# Patient Record
Sex: Male | Born: 1935 | Race: White | Hispanic: No | Marital: Married | Smoking: Never smoker
Health system: Southern US, Community
[De-identification: ages and names within clinical notes are randomized; demographics above are authoritative.]

## PROBLEM LIST (undated history)

## (undated) DIAGNOSIS — I10 Essential (primary) hypertension: Secondary | ICD-10-CM

## (undated) DIAGNOSIS — M1990 Unspecified osteoarthritis, unspecified site: Secondary | ICD-10-CM

## (undated) DIAGNOSIS — D649 Anemia, unspecified: Secondary | ICD-10-CM

## (undated) DIAGNOSIS — C801 Malignant (primary) neoplasm, unspecified: Secondary | ICD-10-CM

## (undated) DIAGNOSIS — M549 Dorsalgia, unspecified: Secondary | ICD-10-CM

## (undated) HISTORY — DX: Unspecified osteoarthritis, unspecified site: M19.90

## (undated) HISTORY — PX: SPINE SURGERY: SHX786

## (undated) HISTORY — DX: Malignant (primary) neoplasm, unspecified: C80.1

## (undated) HISTORY — PX: EYE SURGERY: SHX253

## (undated) HISTORY — PX: COLON SURGERY: SHX602

---

## 2002-04-08 ENCOUNTER — Ambulatory Visit (HOSPITAL_COMMUNITY): Admission: RE | Admit: 2002-04-08 | Discharge: 2002-04-08 | Payer: Self-pay | Admitting: Gastroenterology

## 2003-06-10 ENCOUNTER — Encounter: Admission: RE | Admit: 2003-06-10 | Discharge: 2003-07-22 | Payer: Self-pay | Admitting: *Deleted

## 2006-01-03 ENCOUNTER — Encounter: Admission: RE | Admit: 2006-01-03 | Discharge: 2006-01-03 | Payer: Self-pay | Admitting: Family Medicine

## 2006-01-10 ENCOUNTER — Encounter: Admission: RE | Admit: 2006-01-10 | Discharge: 2006-01-10 | Payer: Self-pay | Admitting: Family Medicine

## 2011-01-09 ENCOUNTER — Other Ambulatory Visit: Payer: Self-pay | Admitting: Family Medicine

## 2011-01-09 DIAGNOSIS — M542 Cervicalgia: Secondary | ICD-10-CM

## 2011-01-11 ENCOUNTER — Ambulatory Visit
Admission: RE | Admit: 2011-01-11 | Discharge: 2011-01-11 | Disposition: A | Discharge status: Home / Self Care | Payer: MEDICARE | Source: Ambulatory Visit | Attending: Family Medicine | Admitting: Family Medicine

## 2011-01-11 ENCOUNTER — Other Ambulatory Visit: Payer: Self-pay | Admitting: Family Medicine

## 2011-01-11 ENCOUNTER — Other Ambulatory Visit: Payer: Self-pay

## 2011-01-11 DIAGNOSIS — M542 Cervicalgia: Secondary | ICD-10-CM

## 2011-01-29 ENCOUNTER — Other Ambulatory Visit: Payer: Self-pay | Admitting: Family Medicine

## 2011-01-29 DIAGNOSIS — M542 Cervicalgia: Secondary | ICD-10-CM

## 2011-01-31 ENCOUNTER — Ambulatory Visit
Admission: RE | Admit: 2011-01-31 | Discharge: 2011-01-31 | Disposition: A | Discharge status: Home / Self Care | Payer: MEDICARE | Source: Ambulatory Visit | Attending: Family Medicine | Admitting: Family Medicine

## 2011-01-31 DIAGNOSIS — M542 Cervicalgia: Secondary | ICD-10-CM

## 2012-06-05 ENCOUNTER — Other Ambulatory Visit: Payer: Self-pay | Admitting: Family Medicine

## 2012-06-05 DIAGNOSIS — M545 Low back pain, unspecified: Secondary | ICD-10-CM

## 2012-06-05 DIAGNOSIS — M79606 Pain in leg, unspecified: Secondary | ICD-10-CM

## 2012-06-08 ENCOUNTER — Ambulatory Visit
Admission: RE | Admit: 2012-06-08 | Discharge: 2012-06-08 | Disposition: A | Discharge status: Home / Self Care | Payer: 59 | Source: Ambulatory Visit | Attending: Family Medicine | Admitting: Family Medicine

## 2012-06-08 DIAGNOSIS — M545 Low back pain, unspecified: Secondary | ICD-10-CM

## 2012-06-08 DIAGNOSIS — M79606 Pain in leg, unspecified: Secondary | ICD-10-CM

## 2014-07-21 ENCOUNTER — Encounter (HOSPITAL_COMMUNITY): Payer: Self-pay | Admitting: Emergency Medicine

## 2014-07-21 ENCOUNTER — Emergency Department (HOSPITAL_COMMUNITY): Payer: Medicare Other

## 2014-07-21 ENCOUNTER — Inpatient Hospital Stay (HOSPITAL_COMMUNITY)
Admission: EM | Admit: 2014-07-21 | Discharge: 2014-07-28 | DRG: 390 | Disposition: A | Discharge status: Home / Self Care | Payer: Medicare Other | Attending: Surgery | Admitting: Surgery

## 2014-07-21 ENCOUNTER — Ambulatory Visit (INDEPENDENT_AMBULATORY_CARE_PROVIDER_SITE_OTHER): Payer: Medicare Other | Admitting: Emergency Medicine

## 2014-07-21 ENCOUNTER — Ambulatory Visit (INDEPENDENT_AMBULATORY_CARE_PROVIDER_SITE_OTHER): Payer: Medicare Other

## 2014-07-21 VITALS — BP 162/70 | HR 63 | Temp 98.9°F | Resp 18 | Ht 68.5 in | Wt 154.0 lb

## 2014-07-21 DIAGNOSIS — Z85038 Personal history of other malignant neoplasm of large intestine: Secondary | ICD-10-CM

## 2014-07-21 DIAGNOSIS — D72829 Elevated white blood cell count, unspecified: Secondary | ICD-10-CM

## 2014-07-21 DIAGNOSIS — M549 Dorsalgia, unspecified: Secondary | ICD-10-CM | POA: Diagnosis present

## 2014-07-21 DIAGNOSIS — K56609 Unspecified intestinal obstruction, unspecified as to partial versus complete obstruction: Principal | ICD-10-CM | POA: Diagnosis present

## 2014-07-21 DIAGNOSIS — R109 Unspecified abdominal pain: Secondary | ICD-10-CM

## 2014-07-21 DIAGNOSIS — Z9049 Acquired absence of other specified parts of digestive tract: Secondary | ICD-10-CM

## 2014-07-21 DIAGNOSIS — I10 Essential (primary) hypertension: Secondary | ICD-10-CM | POA: Diagnosis present

## 2014-07-21 DIAGNOSIS — K56600 Partial intestinal obstruction, unspecified as to cause: Secondary | ICD-10-CM | POA: Diagnosis present

## 2014-07-21 DIAGNOSIS — M129 Arthropathy, unspecified: Secondary | ICD-10-CM | POA: Diagnosis present

## 2014-07-21 HISTORY — DX: Dorsalgia, unspecified: M54.9

## 2014-07-21 LAB — POCT CBC
GRANULOCYTE PERCENT: 84 % — AB (ref 37–80)
HCT, POC: 41.8 % — AB (ref 43.5–53.7)
HEMOGLOBIN: 13.2 g/dL — AB (ref 14.1–18.1)
LYMPH, POC: 1.1 (ref 0.6–3.4)
MCH, POC: 30.8 pg (ref 27–31.2)
MCHC: 31.6 g/dL — AB (ref 31.8–35.4)
MCV: 97.5 fL — AB (ref 80–97)
MID (CBC): 0.6 (ref 0–0.9)
MPV: 7.4 fL (ref 0–99.8)
POC GRANULOCYTE: 9.2 — AB (ref 2–6.9)
POC LYMPH %: 10.2 % (ref 10–50)
POC MID %: 5.8 % (ref 0–12)
Platelet Count, POC: 242 10*3/uL (ref 142–424)
RBC: 4.29 M/uL — AB (ref 4.69–6.13)
RDW, POC: 14.6 %
WBC: 10.9 10*3/uL — AB (ref 4.6–10.2)

## 2014-07-21 LAB — POCT UA - MICROSCOPIC ONLY
CRYSTALS, UR, HPF, POC: NEGATIVE
Casts, Ur, LPF, POC: NEGATIVE
Mucus, UA: NEGATIVE
YEAST UA: NEGATIVE

## 2014-07-21 LAB — COMPREHENSIVE METABOLIC PANEL
ALK PHOS: 73 U/L (ref 39–117)
ALT: 15 U/L (ref 0–53)
AST: 20 U/L (ref 0–37)
Albumin: 3.7 g/dL (ref 3.5–5.2)
Anion gap: 13 (ref 5–15)
BILIRUBIN TOTAL: 0.5 mg/dL (ref 0.3–1.2)
BUN: 20 mg/dL (ref 6–23)
CALCIUM: 8.9 mg/dL (ref 8.4–10.5)
CO2: 23 mEq/L (ref 19–32)
CREATININE: 1.01 mg/dL (ref 0.50–1.35)
Chloride: 103 mEq/L (ref 96–112)
GFR calc Af Amer: 82 mL/min — ABNORMAL LOW (ref >90)
GFR, EST NON AFRICAN AMERICAN: 71 mL/min — AB (ref >90)
GLUCOSE: 136 mg/dL — AB (ref 70–99)
Potassium: 4.4 mEq/L (ref 3.7–5.3)
SODIUM: 139 meq/L (ref 137–147)
TOTAL PROTEIN: 6.8 g/dL (ref 6.0–8.3)

## 2014-07-21 LAB — POCT URINALYSIS DIPSTICK
BILIRUBIN UA: NEGATIVE
GLUCOSE UA: NEGATIVE
KETONES UA: 40
LEUKOCYTES UA: NEGATIVE
NITRITE UA: NEGATIVE
Protein, UA: 30
Spec Grav, UA: >=1.03
Urobilinogen, UA: 0.2
pH, UA: 6

## 2014-07-21 LAB — URINALYSIS, ROUTINE W REFLEX MICROSCOPIC
BILIRUBIN URINE: NEGATIVE
GLUCOSE, UA: NEGATIVE mg/dL
KETONES UR: 40 mg/dL — AB
LEUKOCYTES UA: NEGATIVE
NITRITE: NEGATIVE
Protein, ur: 30 mg/dL — AB
SPECIFIC GRAVITY, URINE: 1.025 (ref 1.005–1.030)
Urobilinogen, UA: 0.2 mg/dL (ref 0.0–1.0)
pH: 6 (ref 5.0–8.0)

## 2014-07-21 LAB — CBC
HEMATOCRIT: 38.6 % — AB (ref 39.0–52.0)
HEMOGLOBIN: 12.6 g/dL — AB (ref 13.0–17.0)
MCH: 30.6 pg (ref 26.0–34.0)
MCHC: 32.6 g/dL (ref 30.0–36.0)
MCV: 93.7 fL (ref 78.0–100.0)
Platelets: 232 10*3/uL (ref 150–400)
RBC: 4.12 MIL/uL — ABNORMAL LOW (ref 4.22–5.81)
RDW: 12.6 % (ref 11.5–15.5)
WBC: 12.2 10*3/uL — AB (ref 4.0–10.5)

## 2014-07-21 LAB — URINE MICROSCOPIC-ADD ON

## 2014-07-21 LAB — LIPASE, BLOOD: Lipase: 31 U/L (ref 11–59)

## 2014-07-21 MED ORDER — SODIUM CHLORIDE 0.9 % IV BOLUS (SEPSIS)
1000.0000 mL | Freq: Once | INTRAVENOUS | Status: AC
  Administered 2014-07-21: 1000 mL via INTRAVENOUS

## 2014-07-21 MED ORDER — MORPHINE SULFATE 4 MG/ML IJ SOLN
4.0000 mg | Freq: Once | INTRAMUSCULAR | Status: AC
  Administered 2014-07-21: 4 mg via INTRAVENOUS
  Filled 2014-07-21: qty 1

## 2014-07-21 MED ORDER — IOHEXOL 300 MG/ML  SOLN
100.0000 mL | Freq: Once | INTRAMUSCULAR | Status: AC | PRN
  Administered 2014-07-21: 100 mL via INTRAVENOUS

## 2014-07-21 MED ORDER — ONDANSETRON HCL 4 MG/2ML IJ SOLN
4.0000 mg | Freq: Once | INTRAMUSCULAR | Status: AC
  Administered 2014-07-21: 4 mg via INTRAVENOUS
  Filled 2014-07-21: qty 2

## 2014-07-21 MED ORDER — IOHEXOL 300 MG/ML  SOLN
50.0000 mL | Freq: Once | INTRAMUSCULAR | Status: AC | PRN
  Administered 2014-07-21: 50 mL via ORAL

## 2014-07-21 MED ORDER — LIDOCAINE HCL 2 % EX GEL
CUTANEOUS | Status: AC
Start: 2014-07-21 — End: 2014-07-22
  Administered 2014-07-22
  Filled 2014-07-21: qty 10

## 2014-07-21 NOTE — Progress Notes (Signed)
[facility] [address], [place] [address] [phone]  Date:  07/21/2014   Name:  Cory Bright   DOB:  1936/12/01   MRN:  [id number]  PCP:   Melinda Crutch, MD    Chief Complaint: Abdominal Pain   History of Present Illness:  Cory Bright is a 78 y.o. very pleasant male patient who presents with the following:  Well yesterday and awakened with abdominal pain and nausea and vomiting.  No stool change.  Last BM this morning.  No blood mucous or pus in stool.  History of colon resection for cancer. No fever or chills.  No GU symptoms.  Poor appetite and po intake today. No improvement with over the counter medications or other home remedies. Denies other complaint or health concern today.   There are no active problems to display for this patient.   Past Medical History  Diagnosis Date  . Arthritis   . Cancer     Past Surgical History  Procedure Laterality Date  . Colon surgery    . Spine surgery      History  Substance Use Topics  . Smoking status: Never Smoker   . Smokeless tobacco: Not on file  . Alcohol Use: No    History reviewed. No pertinent family history.  No Known Allergies  Medication list has been reviewed and updated.  No current outpatient prescriptions on file prior to visit.   No current facility-administered medications on file prior to visit.    Review of Systems:  As per HPI, otherwise negative.    Physical Examination: Filed Vitals:   07/21/14 1750  BP: 162/70  Pulse: 63  Temp: 98.9 F (37.2 C)  Resp: 18   Filed Vitals:   07/21/14 1750  Height: 5' 8.5" (1.74 m)  Weight: 154 lb (69.854 kg)   Body mass index is 23.07 kg/(m^2). Ideal Body Weight: Weight in (lb) to have BMI = 25: 166.5  GEN: WDWN, NAD, Non-toxic, A & O x 3 HEENT: Atraumatic, Normocephalic. Neck supple. No masses, No LAD. Ears and Nose: No external deformity. CV: RRR, No M/G/R. No JVD. No thrill. No extra heart sounds. PULM: CTA  B, no wheezes, crackles, rhonchi. No retractions. No resp. distress. No accessory muscle use. ABD: S, ND, +BS. No rebound. No HSM.  Generalized tenderness worse on left.   EXTR: No c/c/e NEURO Normal gait.  PSYCH: Normally interactive. Conversant. Not depressed or anxious appearing.  Calm demeanor.    Assessment and Plan: Abdominal pain with persistent vomiting Dehydration To ER via EMS  CT abdomen  Signed,  Ellison Carwin, MD   Results for orders placed in visit on 07/21/14  POCT CBC      Result Value Ref Range   WBC 10.9 (*) 4.6 - 10.2 K/uL   Lymph, poc 1.1  0.6 - 3.4   POC LYMPH PERCENT 10.2  10 - 50 %L   MID (cbc) 0.6  0 - 0.9   POC MID % 5.8  0 - 12 %M   POC Granulocyte 9.2 (*) 2 - 6.9   Granulocyte percent 84.0 (*) 37 - 80 %G   RBC 4.29 (*) 4.69 - 6.13 M/uL   Hemoglobin 13.2 (*) 14.1 - 18.1 g/dL   HCT, POC 41.8 (*) 43.5 - 53.7 %   MCV 97.5 (*) 80 - 97 fL   MCH, POC 30.8  27 - 31.2 pg   MCHC 31.6 (*) 31.8 - 35.4 g/dL  RDW, POC 14.6     Platelet Count, POC 242  142 - 424 K/uL   MPV 7.4  0 - 99.8 fL  POCT UA - MICROSCOPIC ONLY      Result Value Ref Range   WBC, Ur, HPF, POC 0-2     RBC, urine, microscopic 2-4     Bacteria, U Microscopic trace     Mucus, UA neg     Epithelial cells, urine per micros 0-1     Crystals, Ur, HPF, POC neg     Casts, Ur, LPF, POC neg     Yeast, UA neg    POCT URINALYSIS DIPSTICK      Result Value Ref Range   Color, UA yellow     Clarity, UA clear     Glucose, UA neg     Bilirubin, UA neg     Ketones, UA 40     Spec Grav, UA >=1.030     Blood, UA trace     pH, UA 6.0     Protein, UA 30     Urobilinogen, UA 0.2     Nitrite, UA neg     Leukocytes, UA Negative     [facility] reading (PRIMARY) by  Dr. Ouida Sills.  No obstruction or free air.  Right colectomy .

## 2014-07-21 NOTE — ED Notes (Signed)
Per EMS pt coming from [place], complaining of abdominal pain since this morning, vomited this morning, vomited x 3 at UC, 18G RFA, adominal pain is LQ, UC MD suspects diverticulitis, UC MD suggest CT scan, 7/10 pain. Denies diarrhea.

## 2014-07-21 NOTE — ED Notes (Signed)
Bed: WA07 Expected date:  Expected time:  Means of arrival:  Comments: EMS 78yo M abd pain from UC, 18g IV placed per UC

## 2014-07-21 NOTE — ED Provider Notes (Signed)
CSN: [id number]     Arrival date & time 07/21/14  2021 History   First MD Initiated Contact with Patient 07/21/14 2049     Chief Complaint  Patient presents with  . Abdominal Pain     (Consider location/radiation/quality/duration/timing/severity/associated sxs/prior Treatment) HPI Pt presents with c/o left sided abdominal pain which began this morning.  He felt well at breakfast, but afterwards developed nausea and vomiting with persistent left sided abdominal pain. No fever/chills.  No change in stools.  He had 3 episodes of emesis- nonbloody and nonbilious.  He has hx of colon resection in 1993 performed for colon cancer- no other treatments required- no hx of other surgeries.  Pt has not had similar symptoms previously.  There are no other associated systemic symptoms, there are no other alleviating or modifying factors.   Past Medical History  Diagnosis Date  . Arthritis   . Back pain   . Cancer     colon   Past Surgical History  Procedure Laterality Date  . Spine surgery    . Colon surgery      colectomy, R   No family history on file. History  Substance Use Topics  . Smoking status: Never Smoker   . Smokeless tobacco: Not on file  . Alcohol Use: No    Review of Systems ROS reviewed and all otherwise negative except for mentioned in HPI    Allergies  Review of patient's allergies indicates no known allergies.  Home Medications   Prior to Admission medications   Medication Sig Start Date End Date Taking? Authorizing Provider  aspirin 81 MG tablet Take 81 mg by mouth daily.   Yes Historical Provider, MD  HYDROcodone-acetaminophen (NORCO) 10-325 MG per tablet Take 1 tablet by mouth every 6 (six) hours as needed for moderate pain.    Yes Historical Provider, MD  lisinopril (PRINIVIL,ZESTRIL) 20 MG tablet Take 20 mg by mouth daily.   Yes Historical Provider, MD  meloxicam (MOBIC) 7.5 MG tablet Take 7.5 mg by mouth daily.   Yes Historical Provider, MD  pravastatin  (PRAVACHOL) 40 MG tablet Take 40 mg by mouth daily.   Yes Historical Provider, MD  tamsulosin (FLOMAX) 0.4 MG CAPS capsule Take 0.4 mg by mouth.   Yes Historical Provider, MD   BP 187/60  Pulse 78  Temp(Src) 99.2 F (37.3 C) (Oral)  Resp 16  SpO2 100% Vitals reviewed Physical Exam Physical Examination: General appearance - alert, well appearing, and in no distress Mental status - alert, oriented to person, place, and time Eyes -no conjunctival injection, no scleral icterus Mouth - MM tacky, OP clear Chest - clear to auscultation, no wheezes, rales or rhonchi, symmetric air entry Heart - normal rate, regular rhythm, normal S1, S2, no murmurs, rubs, clicks or gallops Abdomen - soft, mild ttp in left lower abdomen, no gaurding and no rebound tenderness, nondistended, no masses or organomegaly, nabs Extremities - peripheral pulses normal, no pedal edema, no clubbing or cyanosis Skin - normal coloration and turgor, no rashes  ED Course  Procedures (including critical care time)  11:33 PM d/w surgery for consultation.  They will see patient in the ED.   Labs Review Labs Reviewed  CBC - Abnormal; Notable for the following:    WBC 12.2 (*)    RBC 4.12 (*)    Hemoglobin 12.6 (*)    HCT 38.6 (*)    All other components within normal limits  COMPREHENSIVE METABOLIC PANEL - Abnormal; Notable for the following:  Glucose, Bld 136 (*)    GFR calc non Af Amer 71 (*)    GFR calc Af Amer 82 (*)    All other components within normal limits  URINALYSIS, ROUTINE W REFLEX MICROSCOPIC - Abnormal; Notable for the following:    Hgb urine dipstick TRACE (*)    Ketones, ur 40 (*)    Protein, ur 30 (*)    All other components within normal limits  LIPASE, BLOOD  URINE MICROSCOPIC-ADD ON    Imaging Review Ct Abdomen Pelvis W Contrast  07/21/2014   CLINICAL DATA:  Mid abdominal pain, nausea and vomiting.  EXAM: CT ABDOMEN AND PELVIS WITH CONTRAST  TECHNIQUE: Multidetector CT imaging of the  abdomen and pelvis was performed using the standard protocol following bolus administration of intravenous contrast.  CONTRAST:  71mL OMNIPAQUE IOHEXOL 300 MG/ML SOLN, 13mL OMNIPAQUE IOHEXOL 300 MG/ML SOLN  COMPARISON:  CT of the abdomen performed 08/12/2012  FINDINGS: The visualized lung bases are clear. Mild coronary artery calcification is noted.  The liver and spleen are unremarkable in appearance. The gallbladder is within normal limits. The pancreas and adrenal glands are unremarkable.  The cyst at the anterior aspect of the right kidney has decreased in size, now measuring 3.3 x 1.3 cm, but is now somewhat hyperattenuating. This may reflect minimal hemorrhage into the cyst. Given the interval decrease in size, malignancy is thought to be very unlikely. Attenuation is relatively stable on venous and delayed images.  Scattered small bilateral renal cysts are seen. Nonspecific perinephric stranding is noted bilaterally. There is no evidence of hydronephrosis. No renal or ureteral stones are seen. No perinephric stranding is appreciated.  There is dilatation of small bowel loops to 3.4 cm in maximal diameter, with mild surrounding soft tissue inflammation and trace free fluid, extending from the level of the jejunum to the proximal to mid ileum. There is fecalization of a long segment of ileum, with an associated transition point at the left mid abdomen, raising concern for small bowel obstruction. This most likely reflects an underlying adhesion.  There is decompression of more distal small bowel loops. The patient is status post partial resection of the cecum and ascending colon. The anastomosis is not well characterized, but appears grossly unremarkable. Minimal diverticulosis is noted along the proximal sigmoid colon. The remaining colon is otherwise within normal limits.  The stomach is within normal limits. No acute vascular abnormalities are seen. Minimal calcification is seen along the abdominal aorta.   The bladder is mildly distended and grossly unremarkable in appearance. The prostate remains borderline normal in size. Scattered calcification is seen within the prostate. No inguinal lymphadenopathy is seen.  No acute osseous abnormalities are identified. Multilevel vacuum phenomenon is noted along the lumbar spine. There is mild grade 1 anterolisthesis of L4 on L5, reflecting underlying facet disease.  IMPRESSION: 1. Dilatation of small-bowel loops to 3.4 cm in maximal diameter, with mild surrounding soft tissue inflammation and trace free fluid, extending to the proximal to mid ileum. Fecalization of a long segment of ileum, with a transition point at the left mid abdomen, raising concern for small bowel obstruction. This most likely reflects an underlying adhesion. 2. Scattered small bilateral renal cysts seen. The largest cyst has decreased in size. Its increased attenuation may reflect interval hemorrhage into the cyst. 3. Minimal diverticulosis noted along the proximal sigmoid colon. 4. Minimal degenerative change noted along the lumbar spine. 5. Mild coronary artery calcification noted.  These results were called by telephone at  the time of interpretation on 07/21/2014 at 11:09 pm to Dr. Alfonzo Beers , who verbally acknowledged these results.   Electronically Signed   By: Garald Balding M.D.   On: 07/21/2014 23:09   Dg Abd Acute W/chest  07/21/2014   CLINICAL DATA:  Left-sided abdominal pain.  EXAM: ACUTE ABDOMEN SERIES (ABDOMEN 2 VIEW & CHEST 1 VIEW)  COMPARISON:  Chest x-ray 07/10/2011.  FINDINGS: Lung volumes are normal. No consolidative airspace disease. No pleural effusions. No pneumothorax. No pulmonary nodule or mass noted. Pulmonary vasculature and the cardiomediastinal silhouette are within normal limits.  Postoperative changes of prior right hemicolectomy are noted. Gas and stool are seen scattered throughout the colon extending to the level of the distal rectum. No pathologic distension of  small bowel is noted. No gross evidence of pneumoperitoneum. A few nondilated gas-filled loops of small bowel are noted.  IMPRESSION: 1. Nonspecific nonobstructive bowel gas pattern. 2. No pneumoperitoneum. 3. Status post right hemicolectomy. 4. No radiographic evidence of acute cardiopulmonary disease.   Electronically Signed   By: Vinnie Langton M.D.   On: 07/21/2014 19:34     EKG Interpretation None      MDM   Final diagnoses:  Small bowel obstruction  Leukocytosis    Pt with abdominal pain, nausea and vomiting.  CT scan shows SBO.  Pt feels improved after treatment with IV fluids, morphine, zofran.  Surgery consulted and will see patient in the ED.  Ordered NG tube, pt remains NPO.  Pt udpated about findings and plan for admission.    Nursing notes including past medical history and social history reviewed and considered in documentation Prior records reviewed and considered during this visit     Threasa Beards, MD 07/22/14 0006

## 2014-07-22 DIAGNOSIS — M549 Dorsalgia, unspecified: Secondary | ICD-10-CM | POA: Diagnosis present

## 2014-07-22 DIAGNOSIS — I10 Essential (primary) hypertension: Secondary | ICD-10-CM | POA: Diagnosis present

## 2014-07-22 DIAGNOSIS — Z85038 Personal history of other malignant neoplasm of large intestine: Secondary | ICD-10-CM | POA: Diagnosis not present

## 2014-07-22 DIAGNOSIS — Z9049 Acquired absence of other specified parts of digestive tract: Secondary | ICD-10-CM | POA: Diagnosis not present

## 2014-07-22 DIAGNOSIS — K56609 Unspecified intestinal obstruction, unspecified as to partial versus complete obstruction: Secondary | ICD-10-CM | POA: Diagnosis present

## 2014-07-22 DIAGNOSIS — M129 Arthropathy, unspecified: Secondary | ICD-10-CM | POA: Diagnosis present

## 2014-07-22 DIAGNOSIS — K56600 Partial intestinal obstruction, unspecified as to cause: Secondary | ICD-10-CM | POA: Diagnosis present

## 2014-07-22 LAB — CBC
HCT: 35.1 % — ABNORMAL LOW (ref 39.0–52.0)
HEMOGLOBIN: 11.8 g/dL — AB (ref 13.0–17.0)
MCH: 31.1 pg (ref 26.0–34.0)
MCHC: 33.6 g/dL (ref 30.0–36.0)
MCV: 92.6 fL (ref 78.0–100.0)
Platelets: 237 10*3/uL (ref 150–400)
RBC: 3.79 MIL/uL — AB (ref 4.22–5.81)
RDW: 12.8 % (ref 11.5–15.5)
WBC: 11.3 10*3/uL — ABNORMAL HIGH (ref 4.0–10.5)

## 2014-07-22 LAB — BASIC METABOLIC PANEL
Anion gap: 12 (ref 5–15)
BUN: 18 mg/dL (ref 6–23)
CHLORIDE: 104 meq/L (ref 96–112)
CO2: 24 meq/L (ref 19–32)
Calcium: 8.5 mg/dL (ref 8.4–10.5)
Creatinine, Ser: 0.99 mg/dL (ref 0.50–1.35)
GFR calc Af Amer: >90 mL/min (ref >90)
GFR calc non Af Amer: 78 mL/min — ABNORMAL LOW (ref >90)
GLUCOSE: 113 mg/dL — AB (ref 70–99)
POTASSIUM: 4.1 meq/L (ref 3.7–5.3)
Sodium: 140 mEq/L (ref 137–147)

## 2014-07-22 MED ORDER — ENOXAPARIN SODIUM 40 MG/0.4ML ~~LOC~~ SOLN
40.0000 mg | SUBCUTANEOUS | Status: DC
  Administered 2014-07-22 – 2014-07-28 (×7): 40 mg via SUBCUTANEOUS
  Filled 2014-07-22 (×7): qty 0.4

## 2014-07-22 MED ORDER — DOCUSATE SODIUM 100 MG PO CAPS
100.0000 mg | ORAL_CAPSULE | Freq: Two times a day (BID) | ORAL | Status: DC
  Administered 2014-07-22: 100 mg via ORAL
  Filled 2014-07-22 (×4): qty 1

## 2014-07-22 MED ORDER — CHLORHEXIDINE GLUCONATE 0.12 % MT SOLN
15.0000 mL | Freq: Two times a day (BID) | OROMUCOSAL | Status: DC
  Administered 2014-07-22 – 2014-07-26 (×7): 15 mL via OROMUCOSAL
  Filled 2014-07-22 (×13): qty 15

## 2014-07-22 MED ORDER — MORPHINE SULFATE 2 MG/ML IJ SOLN
2.0000 mg | INTRAMUSCULAR | Status: DC | PRN
  Administered 2014-07-22 (×2): 2 mg via INTRAVENOUS
  Administered 2014-07-22: 4 mg via INTRAVENOUS
  Administered 2014-07-22: 2 mg via INTRAVENOUS
  Administered 2014-07-22: 4 mg via INTRAVENOUS
  Administered 2014-07-23 (×2): 2 mg via INTRAVENOUS
  Filled 2014-07-22: qty 2
  Filled 2014-07-22 (×4): qty 1
  Filled 2014-07-22: qty 2
  Filled 2014-07-22: qty 1

## 2014-07-22 MED ORDER — TAMSULOSIN HCL 0.4 MG PO CAPS
0.4000 mg | ORAL_CAPSULE | Freq: Every day | ORAL | Status: DC
  Administered 2014-07-22: 0.4 mg via ORAL
  Filled 2014-07-22 (×2): qty 1

## 2014-07-22 MED ORDER — LISINOPRIL 20 MG PO TABS
20.0000 mg | ORAL_TABLET | Freq: Every day | ORAL | Status: DC
  Administered 2014-07-22: 20 mg via ORAL
  Filled 2014-07-22 (×2): qty 1

## 2014-07-22 MED ORDER — CETYLPYRIDINIUM CHLORIDE 0.05 % MT LIQD
7.0000 mL | Freq: Two times a day (BID) | OROMUCOSAL | Status: DC
  Administered 2014-07-23 – 2014-07-25 (×6): 7 mL via OROMUCOSAL

## 2014-07-22 MED ORDER — ONDANSETRON HCL 4 MG/2ML IJ SOLN
4.0000 mg | Freq: Four times a day (QID) | INTRAMUSCULAR | Status: DC | PRN
  Administered 2014-07-22 – 2014-07-23 (×3): 4 mg via INTRAVENOUS
  Filled 2014-07-22 (×3): qty 2

## 2014-07-22 MED ORDER — SODIUM CHLORIDE 0.9 % IV SOLN
INTRAVENOUS | Status: DC
  Administered 2014-07-22: 01:00:00 via INTRAVENOUS
  Administered 2014-07-22 – 2014-07-23 (×2): 100 mL via INTRAVENOUS
  Administered 2014-07-23: 16:00:00 via INTRAVENOUS
  Administered 2014-07-24: 100 mL via INTRAVENOUS
  Administered 2014-07-24 – 2014-07-26 (×6): via INTRAVENOUS
  Administered 2014-07-26: 100 mL/h via INTRAVENOUS
  Administered 2014-07-27 (×2): via INTRAVENOUS

## 2014-07-22 NOTE — H&P (Signed)
Cory Bright is an 78 y.o. male.   Chief Complaint: nausea, vomiting HPI: This is a 78 year old male who presents to the hospital with one day of nausea and vomiting and abdominal pain. He was in his usual state of health until this morning when he began to have severe abdominal pain associated with nausea and vomiting. He did have a regular bowel movement this morning. He is status post a right hemicolectomy at [facility] in the 1990s for colon cancer.  Past Medical History  Diagnosis Date  . Arthritis   . Back pain   . Cancer     colon    Past Surgical History  Procedure Laterality Date  . Spine surgery    . Colon surgery      colectomy, R    No family history on file. Social History:  reports that he has never smoked. He does not have any smokeless tobacco history on file. He reports that he does not drink alcohol or use illicit drugs.  Allergies: No Known Allergies   (Not in a hospital admission)  Results for orders placed during the hospital encounter of 07/21/14 (from the past 48 hour(s))  URINALYSIS, ROUTINE W REFLEX MICROSCOPIC     Status: Abnormal   Collection Time    07/21/14  8:51 PM      Result Value Ref Range   Color, Urine YELLOW  YELLOW   APPearance CLEAR  CLEAR   Specific Gravity, Urine 1.025  1.005 - 1.030   pH 6.0  5.0 - 8.0   Glucose, UA NEGATIVE  NEGATIVE mg/dL   Hgb urine dipstick TRACE (*) NEGATIVE   Bilirubin Urine NEGATIVE  NEGATIVE   Ketones, ur 40 (*) NEGATIVE mg/dL   Protein, ur 30 (*) NEGATIVE mg/dL   Urobilinogen, UA 0.2  0.0 - 1.0 mg/dL   Nitrite NEGATIVE  NEGATIVE   Leukocytes, UA NEGATIVE  NEGATIVE  URINE MICROSCOPIC-ADD ON     Status: None   Collection Time    07/21/14  8:51 PM      Result Value Ref Range   WBC, UA 0-2  <3 WBC/hpf   RBC / HPF 7-10  <3 RBC/hpf   Bacteria, UA RARE  RARE  CBC     Status: Abnormal   Collection Time    07/21/14  9:34 PM      Result Value Ref Range   WBC 12.2 (*) 4.0 - 10.5 K/uL   RBC 4.12 (*) 4.22 -  5.81 MIL/uL   Hemoglobin 12.6 (*) 13.0 - 17.0 g/dL   HCT 38.6 (*) 39.0 - 52.0 %   MCV 93.7  78.0 - 100.0 fL   MCH 30.6  26.0 - 34.0 pg   MCHC 32.6  30.0 - 36.0 g/dL   RDW 12.6  11.5 - 15.5 %   Platelets 232  150 - 400 K/uL  COMPREHENSIVE METABOLIC PANEL     Status: Abnormal   Collection Time    07/21/14  9:34 PM      Result Value Ref Range   Sodium 139  137 - 147 mEq/L   Potassium 4.4  3.7 - 5.3 mEq/L   Chloride 103  96 - 112 mEq/L   CO2 23  19 - 32 mEq/L   Glucose, Bld 136 (*) 70 - 99 mg/dL   BUN 20  6 - 23 mg/dL   Creatinine, Ser 1.01  0.50 - 1.35 mg/dL   Calcium 8.9  8.4 - 10.5 mg/dL   Total Protein 6.8  6.0 - 8.3 g/dL   Albumin 3.7  3.5 - 5.2 g/dL   AST 20  0 - 37 U/L   ALT 15  0 - 53 U/L   Alkaline Phosphatase 73  39 - 117 U/L   Total Bilirubin 0.5  0.3 - 1.2 mg/dL   GFR calc non Af Amer 71 (*) >90 mL/min   GFR calc Af Amer 82 (*) >90 mL/min   Comment: (NOTE)     The eGFR has been calculated using the CKD EPI equation.     This calculation has not been validated in all clinical situations.     eGFR's persistently <90 mL/min signify possible Chronic Kidney     Disease.   Anion gap 13  5 - 15  LIPASE, BLOOD     Status: None   Collection Time    07/21/14  9:34 PM      Result Value Ref Range   Lipase 31  11 - 59 U/L   Ct Abdomen Pelvis W Contrast  07/21/2014   CLINICAL DATA:  Mid abdominal pain, nausea and vomiting.  EXAM: CT ABDOMEN AND PELVIS WITH CONTRAST  TECHNIQUE: Multidetector CT imaging of the abdomen and pelvis was performed using the standard protocol following bolus administration of intravenous contrast.  CONTRAST:  16m OMNIPAQUE IOHEXOL 300 MG/ML SOLN, 1060mOMNIPAQUE IOHEXOL 300 MG/ML SOLN  COMPARISON:  CT of the abdomen performed 08/12/2012  FINDINGS: The visualized lung bases are clear. Mild coronary artery calcification is noted.  The liver and spleen are unremarkable in appearance. The gallbladder is within normal limits. The pancreas and adrenal glands  are unremarkable.  The cyst at the anterior aspect of the right kidney has decreased in size, now measuring 3.3 x 1.3 cm, but is now somewhat hyperattenuating. This may reflect minimal hemorrhage into the cyst. Given the interval decrease in size, malignancy is thought to be very unlikely. Attenuation is relatively stable on venous and delayed images.  Scattered small bilateral renal cysts are seen. Nonspecific perinephric stranding is noted bilaterally. There is no evidence of hydronephrosis. No renal or ureteral stones are seen. No perinephric stranding is appreciated.  There is dilatation of small bowel loops to 3.4 cm in maximal diameter, with mild surrounding soft tissue inflammation and trace free fluid, extending from the level of the jejunum to the proximal to mid ileum. There is fecalization of a long segment of ileum, with an associated transition point at the left mid abdomen, raising concern for small bowel obstruction. This most likely reflects an underlying adhesion.  There is decompression of more distal small bowel loops. The patient is status post partial resection of the cecum and ascending colon. The anastomosis is not well characterized, but appears grossly unremarkable. Minimal diverticulosis is noted along the proximal sigmoid colon. The remaining colon is otherwise within normal limits.  The stomach is within normal limits. No acute vascular abnormalities are seen. Minimal calcification is seen along the abdominal aorta.  The bladder is mildly distended and grossly unremarkable in appearance. The prostate remains borderline normal in size. Scattered calcification is seen within the prostate. No inguinal lymphadenopathy is seen.  No acute osseous abnormalities are identified. Multilevel vacuum phenomenon is noted along the lumbar spine. There is mild grade 1 anterolisthesis of L4 on L5, reflecting underlying facet disease.  IMPRESSION: 1. Dilatation of small-bowel loops to 3.4 cm in maximal  diameter, with mild surrounding soft tissue inflammation and trace free fluid, extending to the proximal to mid ileum. Fecalization of  a long segment of ileum, with a transition point at the left mid abdomen, raising concern for small bowel obstruction. This most likely reflects an underlying adhesion. 2. Scattered small bilateral renal cysts seen. The largest cyst has decreased in size. Its increased attenuation may reflect interval hemorrhage into the cyst. 3. Minimal diverticulosis noted along the proximal sigmoid colon. 4. Minimal degenerative change noted along the lumbar spine. 5. Mild coronary artery calcification noted.  These results were called by telephone at the time of interpretation on 07/21/2014 at 11:09 pm to Dr. Alfonzo Beers , who verbally acknowledged these results.   Electronically Signed   By: Garald Balding M.D.   On: 07/21/2014 23:09   Dg Abd Acute W/chest  07/21/2014   CLINICAL DATA:  Left-sided abdominal pain.  EXAM: ACUTE ABDOMEN SERIES (ABDOMEN 2 VIEW & CHEST 1 VIEW)  COMPARISON:  Chest x-ray 07/10/2011.  FINDINGS: Lung volumes are normal. No consolidative airspace disease. No pleural effusions. No pneumothorax. No pulmonary nodule or mass noted. Pulmonary vasculature and the cardiomediastinal silhouette are within normal limits.  Postoperative changes of prior right hemicolectomy are noted. Gas and stool are seen scattered throughout the colon extending to the level of the distal rectum. No pathologic distension of small bowel is noted. No gross evidence of pneumoperitoneum. A few nondilated gas-filled loops of small bowel are noted.  IMPRESSION: 1. Nonspecific nonobstructive bowel gas pattern. 2. No pneumoperitoneum. 3. Status post right hemicolectomy. 4. No radiographic evidence of acute cardiopulmonary disease.   Electronically Signed   By: Vinnie Langton M.D.   On: 07/21/2014 19:34    Review of Systems  Constitutional: Negative for fever and chills.  Respiratory: Negative for  cough and shortness of breath.   Cardiovascular: Negative for chest pain.  Gastrointestinal: Positive for nausea, vomiting and abdominal pain. Negative for diarrhea and constipation.  Genitourinary: Negative for dysuria.  Neurological: Negative for dizziness and headaches.    Blood pressure 187/60, pulse 78, temperature 99.2 F (37.3 C), temperature source Oral, resp. rate 16, SpO2 100.00%. Physical Exam  Constitutional: He is oriented to person, place, and time. He appears well-developed and well-nourished.  HENT:  Head: Normocephalic and atraumatic.  Eyes: Conjunctivae are normal. Pupils are equal, round, and reactive to light.  Neck: Normal range of motion.  Cardiovascular: Normal rate and regular rhythm.   Respiratory: Effort normal and breath sounds normal.  GI: Soft. He exhibits distension. There is tenderness.  Mild TTP in LUQ Well healed vertical midline scar  Musculoskeletal: Normal range of motion.  Neurological: He is alert and oriented to person, place, and time.  Skin: Skin is warm and dry.     Assessment/Plan Cory Bright Is a healthy 78 year old male with a history of right hemicolectomy for colon cancer who presents to the signs and symptoms consistent with a small bowel obstruction.  His CT scan shows fecalization of the ileum with a transition point in the left mid abdomen.  Will insert an NG tube and admit to the floor. We will monitor his urine output closely and rehydrate with IV fluids. I explained to him that these didn't get better with medical therapy. He does shows signs of any chronic narrowing of the small bowel which is more concerning for need for eventual surgery.  Gracilyn Gunia C. 5/73/2256, 72:09 AM

## 2014-07-22 NOTE — Progress Notes (Signed)
Patient ID: Cory Bright, male   DOB: October 08, 1936, 78 y.o.   MRN: [id number]    Subjective: Patient still has some abdominal pain this morning particularly on the left side. No flatus or bowel movements.  Objective: Vital signs in last 24 hours: Temp:  [98.9 F (37.2 C)-99.5 F (37.5 C)] 98.9 F (37.2 C) (08/13 0600) Pulse Rate:  [63-78] 66 (08/13 0600) Resp:  [16-18] 16 (08/13 0600) BP: (138-192)/(57-70) 185/68 mmHg (08/13 0600) SpO2:  [94 %-100 %] 94 % (08/13 0600) Weight:  [154 lb (69.854 kg)] 154 lb (69.854 kg) (08/13 0103) Last BM Date: 07/21/14  Intake/Output from previous day: 08/12 0701 - 08/13 0700 In: 485 [I.V.:485] Out: 408 [Urine:150; Emesis/NG output:258] Intake/Output this shift:    PE: Abd: Soft, mild tenderness in the left portion of his abdomen. Hypoactive bowel sounds, NG tube was feculent output  Lab Results:   Recent Labs  07/21/14 2134 07/22/14 0440  WBC 12.2* 11.3*  HGB 12.6* 11.8*  HCT 38.6* 35.1*  PLT 232 237   BMET  Recent Labs  07/21/14 2134 07/22/14 0440  NA 139 140  K 4.4 4.1  CL 103 104  CO2 23 24  GLUCOSE 136* 113*  BUN 20 18  CREATININE 1.01 0.99  CALCIUM 8.9 8.5   PT/INR No results found for this basename: LABPROT, INR,  in the last 72 hours CMP     Component Value Date/Time   NA 140 07/22/2014 0440   K 4.1 07/22/2014 0440   CL 104 07/22/2014 0440   CO2 24 07/22/2014 0440   GLUCOSE 113* 07/22/2014 0440   BUN 18 07/22/2014 0440   CREATININE 0.99 07/22/2014 0440   CALCIUM 8.5 07/22/2014 0440   PROT 6.8 07/21/2014 2134   ALBUMIN 3.7 07/21/2014 2134   AST 20 07/21/2014 2134   ALT 15 07/21/2014 2134   ALKPHOS 73 07/21/2014 2134   BILITOT 0.5 07/21/2014 2134   GFRNONAA 78* 07/22/2014 0440   GFRAA >90 07/22/2014 0440   Lipase     Component Value Date/Time   LIPASE 31 07/21/2014 2134       Studies/Results: Ct Abdomen Pelvis W Contrast  07/21/2014   CLINICAL DATA:  Mid abdominal pain, nausea and vomiting.  EXAM: CT ABDOMEN  AND PELVIS WITH CONTRAST  TECHNIQUE: Multidetector CT imaging of the abdomen and pelvis was performed using the standard protocol following bolus administration of intravenous contrast.  CONTRAST:  69mL OMNIPAQUE IOHEXOL 300 MG/ML SOLN, 171mL OMNIPAQUE IOHEXOL 300 MG/ML SOLN  COMPARISON:  CT of the abdomen performed 08/12/2012  FINDINGS: The visualized lung bases are clear. Mild coronary artery calcification is noted.  The liver and spleen are unremarkable in appearance. The gallbladder is within normal limits. The pancreas and adrenal glands are unremarkable.  The cyst at the anterior aspect of the right kidney has decreased in size, now measuring 3.3 x 1.3 cm, but is now somewhat hyperattenuating. This may reflect minimal hemorrhage into the cyst. Given the interval decrease in size, malignancy is thought to be very unlikely. Attenuation is relatively stable on venous and delayed images.  Scattered small bilateral renal cysts are seen. Nonspecific perinephric stranding is noted bilaterally. There is no evidence of hydronephrosis. No renal or ureteral stones are seen. No perinephric stranding is appreciated.  There is dilatation of small bowel loops to 3.4 cm in maximal diameter, with mild surrounding soft tissue inflammation and trace free fluid, extending from the level of the jejunum to the proximal to mid ileum. There is  fecalization of a long segment of ileum, with an associated transition point at the left mid abdomen, raising concern for small bowel obstruction. This most likely reflects an underlying adhesion.  There is decompression of more distal small bowel loops. The patient is status post partial resection of the cecum and ascending colon. The anastomosis is not well characterized, but appears grossly unremarkable. Minimal diverticulosis is noted along the proximal sigmoid colon. The remaining colon is otherwise within normal limits.  The stomach is within normal limits. No acute vascular abnormalities  are seen. Minimal calcification is seen along the abdominal aorta.  The bladder is mildly distended and grossly unremarkable in appearance. The prostate remains borderline normal in size. Scattered calcification is seen within the prostate. No inguinal lymphadenopathy is seen.  No acute osseous abnormalities are identified. Multilevel vacuum phenomenon is noted along the lumbar spine. There is mild grade 1 anterolisthesis of L4 on L5, reflecting underlying facet disease.  IMPRESSION: 1. Dilatation of small-bowel loops to 3.4 cm in maximal diameter, with mild surrounding soft tissue inflammation and trace free fluid, extending to the proximal to mid ileum. Fecalization of a long segment of ileum, with a transition point at the left mid abdomen, raising concern for small bowel obstruction. This most likely reflects an underlying adhesion. 2. Scattered small bilateral renal cysts seen. The largest cyst has decreased in size. Its increased attenuation may reflect interval hemorrhage into the cyst. 3. Minimal diverticulosis noted along the proximal sigmoid colon. 4. Minimal degenerative change noted along the lumbar spine. 5. Mild coronary artery calcification noted.  These results were called by telephone at the time of interpretation on 07/21/2014 at 11:09 pm to Dr. Alfonzo Beers , who verbally acknowledged these results.   Electronically Signed   By: Garald Balding M.D.   On: 07/21/2014 23:09   Dg Abd Acute W/chest  07/21/2014   CLINICAL DATA:  Left-sided abdominal pain.  EXAM: ACUTE ABDOMEN SERIES (ABDOMEN 2 VIEW & CHEST 1 VIEW)  COMPARISON:  Chest x-ray 07/10/2011.  FINDINGS: Lung volumes are normal. No consolidative airspace disease. No pleural effusions. No pneumothorax. No pulmonary nodule or mass noted. Pulmonary vasculature and the cardiomediastinal silhouette are within normal limits.  Postoperative changes of prior right hemicolectomy are noted. Gas and stool are seen scattered throughout the colon  extending to the level of the distal rectum. No pathologic distension of small bowel is noted. No gross evidence of pneumoperitoneum. A few nondilated gas-filled loops of small bowel are noted.  IMPRESSION: 1. Nonspecific nonobstructive bowel gas pattern. 2. No pneumoperitoneum. 3. Status post right hemicolectomy. 4. No radiographic evidence of acute cardiopulmonary disease.   Electronically Signed   By: Vinnie Langton M.D.   On: 07/21/2014 19:34    Anti-infectives: Anti-infectives   None     Assessment/Plan  1. Small bowel obstruction 2. History of colon cancer, status post right colectomy in [facility] at [facility] 3. Hypertension  Plan: 1. Continue NG tube, IV fluids, and bowel rest. We will recheck abdominal films in the morning. Continue conservative management for now.   LOS: 1 day   OSBORNE,KELLY E 07/22/2014, 9:01 AM Pager: [phone]  Agree with above.  Alphonsa Overall, MD, [facility] Pager: [phone] Office phone:  [phone]

## 2014-07-23 ENCOUNTER — Inpatient Hospital Stay (HOSPITAL_COMMUNITY): Payer: Medicare Other

## 2014-07-23 LAB — CBC
HEMATOCRIT: 35.8 % — AB (ref 39.0–52.0)
HEMOGLOBIN: 11.6 g/dL — AB (ref 13.0–17.0)
MCH: 30.7 pg (ref 26.0–34.0)
MCHC: 32.4 g/dL (ref 30.0–36.0)
MCV: 94.7 fL (ref 78.0–100.0)
Platelets: 195 10*3/uL (ref 150–400)
RBC: 3.78 MIL/uL — ABNORMAL LOW (ref 4.22–5.81)
RDW: 12.8 % (ref 11.5–15.5)
WBC: 7.1 10*3/uL (ref 4.0–10.5)

## 2014-07-23 LAB — BASIC METABOLIC PANEL
Anion gap: 12 (ref 5–15)
BUN: 23 mg/dL (ref 6–23)
CHLORIDE: 102 meq/L (ref 96–112)
CO2: 28 mEq/L (ref 19–32)
CREATININE: 1.08 mg/dL (ref 0.50–1.35)
Calcium: 8.4 mg/dL (ref 8.4–10.5)
GFR calc non Af Amer: 65 mL/min — ABNORMAL LOW (ref >90)
GFR, EST AFRICAN AMERICAN: 76 mL/min — AB (ref >90)
GLUCOSE: 104 mg/dL — AB (ref 70–99)
Potassium: 3.9 mEq/L (ref 3.7–5.3)
Sodium: 142 mEq/L (ref 137–147)

## 2014-07-23 MED ORDER — PHENOL 1.4 % MT LIQD
1.0000 | OROMUCOSAL | Status: DC | PRN
  Administered 2014-07-23: 1 via OROMUCOSAL
  Filled 2014-07-23: qty 177

## 2014-07-23 MED ORDER — PANTOPRAZOLE SODIUM 40 MG IV SOLR
40.0000 mg | Freq: Two times a day (BID) | INTRAVENOUS | Status: DC
  Administered 2014-07-23 – 2014-07-28 (×11): 40 mg via INTRAVENOUS
  Filled 2014-07-23 (×12): qty 40

## 2014-07-23 MED ORDER — METOPROLOL TARTRATE 1 MG/ML IV SOLN
5.0000 mg | Freq: Four times a day (QID) | INTRAVENOUS | Status: DC
  Administered 2014-07-23 – 2014-07-26 (×9): 5 mg via INTRAVENOUS
  Filled 2014-07-23 (×20): qty 5

## 2014-07-23 NOTE — Progress Notes (Signed)
Patient ID: Cory Bright, male   DOB: 08-Oct-1936, 78 y.o.   MRN: [id number]    Subjective: No flatus or stool.  Abdominal tenderness mildly improved  Objective: Vital signs in last 24 hours: Temp:  [98.7 F (37.1 C)-99.9 F (37.7 C)] 98.9 F (37.2 C) (08/14 0630) Pulse Rate:  [60-67] 66 (08/14 0630) Resp:  [18] 18 (08/14 0630) BP: (145-175)/(46-61) 148/46 mmHg (08/14 0630) SpO2:  [94 %-100 %] 100 % (08/14 0630) Last BM Date: 07/21/14  Intake/Output from previous day: 08/13 0701 - 08/14 0700 In: 2406.7 [I.V.:2406.7] Out: 2925 [Urine:1450; Emesis/NG output:1475] Intake/Output this shift:    PE: Abd: soft, still minimal distention, but improved, less tender, few BS, NGT with some old bloody drainage, more bilious today than feculent  Lab Results:   Recent Labs  07/22/14 0440 07/23/14 0415  WBC 11.3* 7.1  HGB 11.8* 11.6*  HCT 35.1* 35.8*  PLT 237 195   BMET  Recent Labs  07/22/14 0440 07/23/14 0415  NA 140 142  K 4.1 3.9  CL 104 102  CO2 24 28  GLUCOSE 113* 104*  BUN 18 23  CREATININE 0.99 1.08  CALCIUM 8.5 8.4   PT/INR No results found for this basename: LABPROT, INR,  in the last 72 hours CMP     Component Value Date/Time   NA 142 07/23/2014 0415   K 3.9 07/23/2014 0415   CL 102 07/23/2014 0415   CO2 28 07/23/2014 0415   GLUCOSE 104* 07/23/2014 0415   BUN 23 07/23/2014 0415   CREATININE 1.08 07/23/2014 0415   CALCIUM 8.4 07/23/2014 0415   PROT 6.8 07/21/2014 2134   ALBUMIN 3.7 07/21/2014 2134   AST 20 07/21/2014 2134   ALT 15 07/21/2014 2134   ALKPHOS 73 07/21/2014 2134   BILITOT 0.5 07/21/2014 2134   GFRNONAA 65* 07/23/2014 0415   GFRAA 76* 07/23/2014 0415   Lipase     Component Value Date/Time   LIPASE 31 07/21/2014 2134       Studies/Results: Ct Abdomen Pelvis W Contrast  07/21/2014   CLINICAL DATA:  Mid abdominal pain, nausea and vomiting.  EXAM: CT ABDOMEN AND PELVIS WITH CONTRAST  TECHNIQUE: Multidetector CT imaging of the abdomen and pelvis  was performed using the standard protocol following bolus administration of intravenous contrast.  CONTRAST:  62mL OMNIPAQUE IOHEXOL 300 MG/ML SOLN, 124mL OMNIPAQUE IOHEXOL 300 MG/ML SOLN  COMPARISON:  CT of the abdomen performed 08/12/2012  FINDINGS: The visualized lung bases are clear. Mild coronary artery calcification is noted.  The liver and spleen are unremarkable in appearance. The gallbladder is within normal limits. The pancreas and adrenal glands are unremarkable.  The cyst at the anterior aspect of the right kidney has decreased in size, now measuring 3.3 x 1.3 cm, but is now somewhat hyperattenuating. This may reflect minimal hemorrhage into the cyst. Given the interval decrease in size, malignancy is thought to be very unlikely. Attenuation is relatively stable on venous and delayed images.  Scattered small bilateral renal cysts are seen. Nonspecific perinephric stranding is noted bilaterally. There is no evidence of hydronephrosis. No renal or ureteral stones are seen. No perinephric stranding is appreciated.  There is dilatation of small bowel loops to 3.4 cm in maximal diameter, with mild surrounding soft tissue inflammation and trace free fluid, extending from the level of the jejunum to the proximal to mid ileum. There is fecalization of a long segment of ileum, with an associated transition point at the left mid abdomen, raising  concern for small bowel obstruction. This most likely reflects an underlying adhesion.  There is decompression of more distal small bowel loops. The patient is status post partial resection of the cecum and ascending colon. The anastomosis is not well characterized, but appears grossly unremarkable. Minimal diverticulosis is noted along the proximal sigmoid colon. The remaining colon is otherwise within normal limits.  The stomach is within normal limits. No acute vascular abnormalities are seen. Minimal calcification is seen along the abdominal aorta.  The bladder is  mildly distended and grossly unremarkable in appearance. The prostate remains borderline normal in size. Scattered calcification is seen within the prostate. No inguinal lymphadenopathy is seen.  No acute osseous abnormalities are identified. Multilevel vacuum phenomenon is noted along the lumbar spine. There is mild grade 1 anterolisthesis of L4 on L5, reflecting underlying facet disease.  IMPRESSION: 1. Dilatation of small-bowel loops to 3.4 cm in maximal diameter, with mild surrounding soft tissue inflammation and trace free fluid, extending to the proximal to mid ileum. Fecalization of a long segment of ileum, with a transition point at the left mid abdomen, raising concern for small bowel obstruction. This most likely reflects an underlying adhesion. 2. Scattered small bilateral renal cysts seen. The largest cyst has decreased in size. Its increased attenuation may reflect interval hemorrhage into the cyst. 3. Minimal diverticulosis noted along the proximal sigmoid colon. 4. Minimal degenerative change noted along the lumbar spine. 5. Mild coronary artery calcification noted.  These results were called by telephone at the time of interpretation on 07/21/2014 at 11:09 pm to Dr. Alfonzo Beers , who verbally acknowledged these results.   Electronically Signed   By: Garald Balding M.D.   On: 07/21/2014 23:09   Dg Abd 2 Views  07/23/2014   CLINICAL DATA:  Abdominal pain.  Followup small bowel obstruction.  EXAM: ABDOMEN - 2 VIEW  COMPARISON:  07/21/2014  FINDINGS: A new nasogastric tube is seen with tip in the mid stomach. Mild decrease in dilated small bowel loops seen mainly in the left abdomen. Surgical clips and staples again seen within the right lower quadrant. No evidence of free air.  IMPRESSION: Decreased small bowel dilatation. New nasogastric tube tip in gastric body.   Electronically Signed   By: Earle Gell M.D.   On: 07/23/2014 08:11   Dg Abd Acute W/chest  07/21/2014   CLINICAL DATA:  Left-sided  abdominal pain.  EXAM: ACUTE ABDOMEN SERIES (ABDOMEN 2 VIEW & CHEST 1 VIEW)  COMPARISON:  Chest x-ray 07/10/2011.  FINDINGS: Lung volumes are normal. No consolidative airspace disease. No pleural effusions. No pneumothorax. No pulmonary nodule or mass noted. Pulmonary vasculature and the cardiomediastinal silhouette are within normal limits.  Postoperative changes of prior right hemicolectomy are noted. Gas and stool are seen scattered throughout the colon extending to the level of the distal rectum. No pathologic distension of small bowel is noted. No gross evidence of pneumoperitoneum. A few nondilated gas-filled loops of small bowel are noted.  IMPRESSION: 1. Nonspecific nonobstructive bowel gas pattern. 2. No pneumoperitoneum. 3. Status post right hemicolectomy. 4. No radiographic evidence of acute cardiopulmonary disease.   Electronically Signed   By: Vinnie Langton M.D.   On: 07/21/2014 19:34    Anti-infectives: Anti-infectives   None     Assessment/Plan  1. Small bowel obstruction  2. History of colon cancer, status post right colectomy in 1994 at [facility]  3. Hypertension  Plan: 1. Cont conservative management 2. Repeat films in am 3. May  need OR if does not improve 4. Add PPI   LOS: 2 days   OSBORNE,KELLY E 07/23/2014, 11:03 AM Pager: [phone]  Agree with above. His KUB may be a little better.  Alphonsa Overall, MD, [facility] Pager: [phone] Office phone:  [phone]

## 2014-07-24 ENCOUNTER — Inpatient Hospital Stay (HOSPITAL_COMMUNITY): Payer: Medicare Other

## 2014-07-24 NOTE — Progress Notes (Signed)
Patient ID: Cory Bright, male   DOB: 1936-04-29, 78 y.o.   MRN: [id number]    Subjective: Patient states he feels better. Still has a little pain but less and has not used any pain medicine and 12 hours. Had a small bowel movement.  Says distention is a little better as well.  Objective: Vital signs in last 24 hours: Temp:  [98.1 F (36.7 C)-98.8 F (37.1 C)] 98.8 F (37.1 C) August 07, 2023 0527) Pulse Rate:  [60-67] 60 Aug 07, 2023 0527) Resp:  [17-18] 18 07-Aug-2023 0527) BP: (127-154)/(49-53) 127/49 mmHg 08/07/2023 0527) SpO2:  [95 %-97 %] 96 % 08/07/23 0527) Last BM Date: 07/21/14  Intake/Output from previous day: 08/14 0701 - 07-Aug-2023 0700 In: 2340 [I.V.:2340] Out: 2900 [Urine:800; Emesis/NG output:2100] Intake/Output this shift: Total I/O In: -  Out: 100 [Urine:100]  General appearance: alert, cooperative and no distress GI: very mild distention. Mild abdominal tenderness left somewhat greater than right. No guarding. No masses.  Lab Results:   Recent Labs  07/22/14 0440 07/23/14 0415  WBC 11.3* 7.1  HGB 11.8* 11.6*  HCT 35.1* 35.8*  PLT 237 195   BMET  Recent Labs  07/22/14 0440 07/23/14 0415  NA 140 142  K 4.1 3.9  CL 104 102  CO2 24 28  GLUCOSE 113* 104*  BUN 18 23  CREATININE 0.99 1.08  CALCIUM 8.5 8.4     Studies/Results: Dg Abd 2 Views  08-06-2014   CLINICAL DATA:  Small bowel obstruction.  Continued surveillance.  EXAM: ABDOMEN - 2 VIEW  COMPARISON:  07/23/2014.  FINDINGS: Normally positioned enteric tube. Continued air-fluid levels without significant bowel distention. Anastomotic staples RIGHT lower quadrant. LEFT pleural effusion. No visible free air. No acute osseous findings.  IMPRESSION: Stable to slightly improved bowel distention. Continued air-fluid levels.   Electronically Signed   By: Rolla Flatten M.D.   On: 2014/08/06 10:26   Dg Abd 2 Views  07/23/2014   CLINICAL DATA:  Abdominal pain.  Followup small bowel obstruction.  EXAM: ABDOMEN - 2 VIEW  COMPARISON:   07/21/2014  FINDINGS: A new nasogastric tube is seen with tip in the mid stomach. Mild decrease in dilated small bowel loops seen mainly in the left abdomen. Surgical clips and staples again seen within the right lower quadrant. No evidence of free air.  IMPRESSION: Decreased small bowel dilatation. New nasogastric tube tip in gastric body.   Electronically Signed   By: Earle Gell M.D.   On: 07/23/2014 08:11    Anti-infectives: Anti-infectives   None      Assessment/Plan: Small bowel obstruction likely secondary to adhesions with history of partial colectomy in the 1990s. Slow gradual improvement. I reviewed his x-rays which still so a few minimally dilated loops but slightly improved. Would continue current treatment for now as there is some progress. He is not yet after completely resolved. Repeat lab and x-rays tomorrow.    LOS: 3 days    Charvi Gammage T 2014-08-06

## 2014-07-25 ENCOUNTER — Inpatient Hospital Stay (HOSPITAL_COMMUNITY): Payer: Medicare Other

## 2014-07-25 LAB — BASIC METABOLIC PANEL
Anion gap: 17 — ABNORMAL HIGH (ref 5–15)
BUN: 32 mg/dL — AB (ref 6–23)
CALCIUM: 8.5 mg/dL (ref 8.4–10.5)
CO2: 23 mEq/L (ref 19–32)
Chloride: 107 mEq/L (ref 96–112)
Creatinine, Ser: 0.97 mg/dL (ref 0.50–1.35)
GFR calc Af Amer: >90 mL/min (ref >90)
GFR, EST NON AFRICAN AMERICAN: 79 mL/min — AB (ref >90)
GLUCOSE: 80 mg/dL (ref 70–99)
Potassium: 3.7 mEq/L (ref 3.7–5.3)
Sodium: 147 mEq/L (ref 137–147)

## 2014-07-25 LAB — CBC
HEMATOCRIT: 34.1 % — AB (ref 39.0–52.0)
HEMOGLOBIN: 11 g/dL — AB (ref 13.0–17.0)
MCH: 31.1 pg (ref 26.0–34.0)
MCHC: 32.3 g/dL (ref 30.0–36.0)
MCV: 96.3 fL (ref 78.0–100.0)
Platelets: 205 10*3/uL (ref 150–400)
RBC: 3.54 MIL/uL — ABNORMAL LOW (ref 4.22–5.81)
RDW: 12.7 % (ref 11.5–15.5)
WBC: 5.3 10*3/uL (ref 4.0–10.5)

## 2014-07-25 NOTE — Progress Notes (Signed)
Patient ID: Cory Bright, male   DOB: [birth year], 78 y.o.   MRN: [id number] [facility] Surgery Progress Note:   * No surgery found *  Subjective: Mental status is clear.  No pain.  Has been up walking around a lot Objective: Vital signs in last 24 hours: Temp:  [98 F (36.7 C)-98.6 F (37 C)] 98.3 F (36.8 C) (08/16 0531) Pulse Rate:  [54-58] 58 (08/16 0531) Resp:  [15-18] 15 (08/16 0531) BP: (137-145)/(42-53) 145/53 mmHg (08/16 0531) SpO2:  [97 %-98 %] 97 % (08/16 0531)  Intake/Output from previous day: 08/15 0701 - 08/16 0700 In: 2453.3 [I.V.:2453.3] Out: 2050 [Urine:700; Emesis/NG output:1350] Intake/Output this shift:    Physical Exam: Work of breathing is normal.  Abdomen is nontender.  Has had multiple BMs.  NG in place with bilious drainage  Lab Results:  Results for orders placed during the hospital encounter of 07/21/14 (from the past 48 hour(s))  CBC     Status: Abnormal   Collection Time    07/25/14  5:42 AM      Result Value Ref Range   WBC 5.3  4.0 - 10.5 K/uL   RBC 3.54 (*) 4.22 - 5.81 MIL/uL   Hemoglobin 11.0 (*) 13.0 - 17.0 g/dL   HCT 34.1 (*) 39.0 - 52.0 %   MCV 96.3  78.0 - 100.0 fL   MCH 31.1  26.0 - 34.0 pg   MCHC 32.3  30.0 - 36.0 g/dL   RDW 12.7  11.5 - 15.5 %   Platelets 205  150 - 400 K/uL  BASIC METABOLIC PANEL     Status: Abnormal   Collection Time    07/25/14  5:42 AM      Result Value Ref Range   Sodium 147  137 - 147 mEq/L   Potassium 3.7  3.7 - 5.3 mEq/L   Chloride 107  96 - 112 mEq/L   CO2 23  19 - 32 mEq/L   Glucose, Bld 80  70 - 99 mg/dL   BUN 32 (*) 6 - 23 mg/dL   Creatinine, Ser 0.97  0.50 - 1.35 mg/dL   Calcium 8.5  8.4 - 10.5 mg/dL   GFR calc non Af Amer 79 (*) >90 mL/min   GFR calc Af Amer >90  >90 mL/min   Comment: (NOTE)     The eGFR has been calculated using the CKD EPI equation.     This calculation has not been validated in all clinical situations.     eGFR's persistently <90 mL/min signify possible Chronic  Kidney     Disease.   Anion gap 17 (*) 5 - 15    Radiology/Results: Dg Abd 2 Views  07/24/2014   CLINICAL DATA:  Small bowel obstruction.  Continued surveillance.  EXAM: ABDOMEN - 2 VIEW  COMPARISON:  07/23/2014.  FINDINGS: Normally positioned enteric tube. Continued air-fluid levels without significant bowel distention. Anastomotic staples RIGHT lower quadrant. LEFT pleural effusion. No visible free air. No acute osseous findings.  IMPRESSION: Stable to slightly improved bowel distention. Continued air-fluid levels.   Electronically Signed   By: Rolla Flatten M.D.   On: 07/24/2014 10:26   Dg Abd Portable 2v  07/25/2014   CLINICAL DATA:  Followup small bowel obstruction.  EXAM: PORTABLE ABDOMEN - 2 VIEW  COMPARISON:  07/24/2014  FINDINGS: Mild small bowel dilation with associated air-fluid levels the similar to the previous day's study. Nasogastric tube tip lies in the stomach. There multiple surgical vascular clips and anastomosis  staples in the right mid to lower quadrant.  No free air.  IMPRESSION: 1. Findings consistent with a persistent, low grade small bowel obstruction without significant change from the previous day's study. No free air.   Electronically Signed   By: Lajean Manes M.D.   On: 07/25/2014 07:21    Anti-infectives: Anti-infectives   None      Assessment/Plan: Problem List: Patient Active Problem List   Diagnosis Date Noted  . Small bowel obstruction 07/22/2014  . SBO (small bowel obstruction) 07/22/2014    No flatus but BMs.  Will clamp NG and observe  * No surgery found *    LOS: 4 days   Matt B. Hassell Done, MD, [facility] [phone] beeper [phone]  07/25/2014 8:53 AM

## 2014-07-26 NOTE — Care Management Note (Signed)
    Page 1 of 1   07/27/2014     4:24:48 PM CARE MANAGEMENT NOTE 07/27/2014  Patient:  Cory Bright, Cory Bright   Account Number:  [id number]  Date Initiated:  07/26/2014  Documentation initiated by:  Sunday Spillers  Subjective/Objective Assessment:   78 yo male admitted with SBO. PTA lived at home with spouse.     Action/Plan:   Home when stable   Anticipated DC Date:  07/29/2014   Anticipated DC Plan:  [place]  CM consult      Choice offered to / List presented to:             Status of service:  Completed, signed off Medicare Important Message given?  YES (If response is "NO", the following Medicare IM given date fields will be blank) Date Medicare IM given:  07/27/2014 Medicare IM given by:  [facility] Date Additional Medicare IM given:   Additional Medicare IM given by:    Discharge Disposition:  HOME/SELF CARE  Per UR Regulation:  Reviewed for med. necessity/level of care/duration of stay  If discussed at [place] of Stay Meetings, dates discussed:    Comments:

## 2014-07-26 NOTE — Progress Notes (Signed)
  Subjective: Passing gas and bowels are moving.  Objective: Vital signs in last 24 hours: Temp:  [97.6 F (36.4 C)-98 F (36.7 C)] 97.9 F (36.6 C) (08/17 0551) Pulse Rate:  [51-68] 68 (08/17 0551) Resp:  [16] 16 (08/17 0551) BP: (145-170)/(50-55) 145/55 mmHg (08/17 0551) SpO2:  [100 %] 100 % (08/17 0551) Last BM Date: 07/26/14  Intake/Output from previous day: 08/16 0701 - 08/17 0700 In: 2400 [I.V.:2400] Out: 500 [Urine:400; Emesis/NG output:100] Intake/Output this shift: Total I/O In: -  Out: 175 [Urine:175]  PE: General- In NAD Abdomen-soft, non-tender, non-distended, +BS  Lab Results:   Recent Labs  07/25/14 0542  WBC 5.3  HGB 11.0*  HCT 34.1*  PLT 205   BMET  Recent Labs  07/25/14 0542  NA 147  K 3.7  CL 107  CO2 23  GLUCOSE 80  BUN 32*  CREATININE 0.97  CALCIUM 8.5   PT/INR No results found for this basename: LABPROT, INR,  in the last 72 hours Comprehensive Metabolic Panel:    Component Value Date/Time   NA 147 07/25/2014 0542   NA 142 07/23/2014 0415   K 3.7 07/25/2014 0542   K 3.9 07/23/2014 0415   CL 107 07/25/2014 0542   CL 102 07/23/2014 0415   CO2 23 07/25/2014 0542   CO2 28 07/23/2014 0415   BUN 32* 07/25/2014 0542   BUN 23 07/23/2014 0415   CREATININE 0.97 07/25/2014 0542   CREATININE 1.08 07/23/2014 0415   GLUCOSE 80 07/25/2014 0542   GLUCOSE 104* 07/23/2014 0415   CALCIUM 8.5 07/25/2014 0542   CALCIUM 8.4 07/23/2014 0415   AST 20 07/21/2014 2134   ALT 15 07/21/2014 2134   ALKPHOS 73 07/21/2014 2134   BILITOT 0.5 07/21/2014 2134   PROT 6.8 07/21/2014 2134   ALBUMIN 3.7 07/21/2014 2134     Studies/Results: Dg Abd Portable 2v  07/25/2014   CLINICAL DATA:  Followup small bowel obstruction.  EXAM: PORTABLE ABDOMEN - 2 VIEW  COMPARISON:  07/24/2014  FINDINGS: Mild small bowel dilation with associated air-fluid levels the similar to the previous day's study. Nasogastric tube tip lies in the stomach. There multiple surgical vascular clips and  anastomosis staples in the right mid to lower quadrant.  No free air.  IMPRESSION: 1. Findings consistent with a persistent, low grade small bowel obstruction without significant change from the previous day's study. No free air.   Electronically Signed   By: Lajean Manes M.D.   On: 07/25/2014 07:21    Anti-infectives: Anti-infectives   None      Assessment Active Problems:   Small bowel obstruction-improving with non-operative management.     LOS: 5 days   Plan: Remove ng tube.  Start clear liquids.   Cory Bright J 07/26/2014

## 2014-07-27 MED ORDER — SIMVASTATIN 20 MG PO TABS
20.0000 mg | ORAL_TABLET | Freq: Every day | ORAL | Status: DC
  Administered 2014-07-27: 20 mg via ORAL
  Filled 2014-07-27 (×2): qty 1

## 2014-07-27 MED ORDER — LISINOPRIL 20 MG PO TABS
20.0000 mg | ORAL_TABLET | Freq: Every day | ORAL | Status: DC
  Administered 2014-07-27 – 2014-07-28 (×2): 20 mg via ORAL
  Filled 2014-07-27 (×2): qty 1

## 2014-07-27 MED ORDER — TAMSULOSIN HCL 0.4 MG PO CAPS
0.4000 mg | ORAL_CAPSULE | Freq: Every day | ORAL | Status: DC
  Administered 2014-07-27 – 2014-07-28 (×2): 0.4 mg via ORAL
  Filled 2014-07-27 (×2): qty 1

## 2014-07-27 NOTE — Progress Notes (Signed)
[facility] Progress Note     Subjective: Pt doing well, less pain, feels full from dinner last night, he thinks he overdid it with the clear liquids.  Ambulating well, having flatus and multiple BM's.  No N/V.  Doing well on IS.    Objective: Vital signs in last 24 hours: Temp:  [98.1 F (36.7 C)-99.2 F (37.3 C)] 98.1 F (36.7 C) (08/18 0548) Pulse Rate:  [48-59] 54 (08/18 0548) Resp:  [16-18] 18 (08/18 0548) BP: (144-164)/(40-75) 145/40 mmHg (08/18 0548) SpO2:  [97 %-100 %] 97 % (08/18 0548) Last BM Date: 07/26/14  Intake/Output from previous day: 08/17 0701 - 08/18 0700 In: 3510 [P.O.:1110; I.V.:2400] Out: 600 [Urine:600] Intake/Output this shift:    PE: Gen:  Alert, NAD, pleasant Abd: Soft, minimal tenderness, ND, +BS, no HSM   Lab Results:   Recent Labs  07/25/14 0542  WBC 5.3  HGB 11.0*  HCT 34.1*  PLT 205   BMET  Recent Labs  07/25/14 0542  NA 147  K 3.7  CL 107  CO2 23  GLUCOSE 80  BUN 32*  CREATININE 0.97  CALCIUM 8.5   PT/INR No results found for this basename: LABPROT, INR,  in the last 72 hours CMP     Component Value Date/Time   NA 147 07/25/2014 0542   K 3.7 07/25/2014 0542   CL 107 07/25/2014 0542   CO2 23 07/25/2014 0542   GLUCOSE 80 07/25/2014 0542   BUN 32* 07/25/2014 0542   CREATININE 0.97 07/25/2014 0542   CALCIUM 8.5 07/25/2014 0542   PROT 6.8 07/21/2014 2134   ALBUMIN 3.7 07/21/2014 2134   AST 20 07/21/2014 2134   ALT 15 07/21/2014 2134   ALKPHOS 73 07/21/2014 2134   BILITOT 0.5 07/21/2014 2134   GFRNONAA 79* 07/25/2014 0542   GFRAA >90 07/25/2014 0542   Lipase     Component Value Date/Time   LIPASE 31 07/21/2014 2134       Studies/Results: No results found.  Anti-infectives: Anti-infectives   None       Assessment/Plan SBO - improving H/o colon cancer s/p Right colectomy  Plan: 1.  IVF, pain control, antiemetics 2.  NG removed, tolerating clears, but feels a bit full, advance to fulls at dinner,  soft breakfast tomorrow 3.  Ambulate and IS 4.  SCD's and lovenox 5.  Potentially home tomorrow if tolerating diet well    LOS: 6 days    DORT, Jinny Blossom 07/27/2014, 7:43 AM Pager: [phone]

## 2014-07-27 NOTE — Progress Notes (Signed)
He was seen and examined. He has some bloating after eating.  Passing gas.  Will be slow on advancing diet.

## 2014-07-28 NOTE — Discharge Summary (Signed)
Patient seen and examined.  Agree with PA's note.  

## 2014-07-28 NOTE — Discharge Summary (Signed)
   [facility] Discharge Summary   Patient ID: Cory Bright MRN: [id number] DOB/AGE: 1936-01-09 78 y.o.  Admit date: 07/21/2014 Discharge date: 07/28/2014  Admitting Diagnosis: SBO  Discharge Diagnosis Patient Active Problem List   Diagnosis Date Noted  . Small bowel obstruction 07/22/2014  . SBO (small bowel obstruction) 07/22/2014    Consultants None  Imaging: No results found.  Procedures None  Hospital Course:  78 year old male who presents to [facility] with one day of nausea and vomiting and abdominal pain. He was in his usual state of health until this morning when he began to have severe abdominal pain associated with nausea and vomiting. He did have a regular bowel movement this morning. He is status post a right hemicolectomy at [facility] in the 1990s for colon cancer.   Workup showed SBO on CT and KUB's.  Patient was admitted and was transferred to the floor for conservative management with bowel rest, NG tube, IVF, and pain/nausea control.  Luckily his SBO started to resolve, clamping trails were successful, and NG tube was discontinued on 07/26/14.  Diet was advanced as tolerated.  On HD #7 the patient was voiding well, tolerating diet, ambulating well, pain well controlled, vital signs stable, incisions c/d/i and felt stable for discharge home.  Patient will follow up in our office as needed and knows to call with questions or concerns.     Medication List         aspirin 81 MG tablet  Take 81 mg by mouth daily.     HYDROcodone-acetaminophen 10-325 MG per tablet  Commonly known as:  NORCO  Take 1 tablet by mouth every 6 (six) hours as needed for moderate pain.     lisinopril 20 MG tablet  Commonly known as:  PRINIVIL,ZESTRIL  Take 20 mg by mouth daily.     meloxicam 7.5 MG tablet  Commonly known as:  MOBIC  Take 7.5 mg by mouth daily.     pravastatin 40 MG tablet  Commonly known as:  PRAVACHOL  Take 40 mg by mouth daily.     tamsulosin 0.4 MG  Caps capsule  Commonly known as:  FLOMAX  Take 0.4 mg by mouth.             Follow-up Information   Call [facility], [place]. (As needed, If symptoms worsen)    Specialty:  General Surgery   Contact information:   [address] [place] [address] [phone]      Follow up with  Melinda Crutch, MD.   Specialty:  Family Medicine   Contact information:   [address] [place] [address] [phone]       Signed: Coralie Keens, [facility] [phone]  07/28/2014, 8:50 AM

## 2014-07-28 NOTE — Progress Notes (Signed)
  Subjective: Tolerated soft diet without bloating.  Bowels moving.  Objective: Vital signs in last 24 hours: Temp:  [98.4 F (36.9 C)-98.7 F (37.1 C)] 98.7 F (37.1 C) (08/19 0532) Pulse Rate:  [56-65] 65 (08/19 0532) Resp:  [16] 16 (08/19 0532) BP: (150-168)/(53-60) 150/60 mmHg (08/19 0532) SpO2:  [98 %-100 %] 98 % (08/19 0532) Last BM Date: 07/27/14  Intake/Output from previous day: 08/18 0701 - 08/19 0700 In: 3240 [P.O.:840; I.V.:2400] Out: 900 [Urine:900] Intake/Output this shift:    PE: General- In NAD Abdomen-soft, non-tender, non-distended  Lab Results:  No results found for this basename: WBC, HGB, HCT, PLT,  in the last 72 hours BMET No results found for this basename: NA, K, CL, CO2, GLUCOSE, BUN, CREATININE, CALCIUM,  in the last 72 hours PT/INR No results found for this basename: LABPROT, INR,  in the last 72 hours Comprehensive Metabolic Panel:    Component Value Date/Time   NA 147 07/25/2014 0542   NA 142 07/23/2014 0415   K 3.7 07/25/2014 0542   K 3.9 07/23/2014 0415   CL 107 07/25/2014 0542   CL 102 07/23/2014 0415   CO2 23 07/25/2014 0542   CO2 28 07/23/2014 0415   BUN 32* 07/25/2014 0542   BUN 23 07/23/2014 0415   CREATININE 0.97 07/25/2014 0542   CREATININE 1.08 07/23/2014 0415   GLUCOSE 80 07/25/2014 0542   GLUCOSE 104* 07/23/2014 0415   CALCIUM 8.5 07/25/2014 0542   CALCIUM 8.4 07/23/2014 0415   AST 20 07/21/2014 2134   ALT 15 07/21/2014 2134   ALKPHOS 73 07/21/2014 2134   BILITOT 0.5 07/21/2014 2134   PROT 6.8 07/21/2014 2134   ALBUMIN 3.7 07/21/2014 2134     Studies/Results: No results found.  Anti-infectives: Anti-infectives   None      Assessment Active Problems:   Small bowel obstruction-resolved.     LOS: 7 days   Plan: Discharge today.  Follow up with PCP.  Discharge instructions given.   Makaelah Cranfield J 07/28/2014

## 2014-07-28 NOTE — Discharge Instructions (Addendum)
Bland soft diet for one week.  Do not overeat.  Avoid foods that give you a lot of gas.   Small Bowel Obstruction A small bowel obstruction is a blockage (obstruction) of the small intestine (small bowel). The small bowel is a long, slender tube that connects the stomach to the colon. Its job is to absorb nutrients from the fluids and foods you consume into the bloodstream.  CAUSES  There are many causes of intestinal blockage. The most common ones include:  Hernias. This is a more common cause in children than adults.  Inflammatory bowel disease (enteritis and colitis).  Twisting of the bowel (volvulus).  Tumors.  Scar tissue (adhesions) from previous surgery or radiation treatment.  Recent surgery. This may cause an acute small bowel obstruction called an ileus. SYMPTOMS   Abdominal pain. This may be dull cramps or sharp pain. It may occur in one area or may be present in the entire abdomen. Pain can range from mild to severe, depending on the degree of obstruction.  Nausea and vomiting. Vomit may be greenish or yellow bile color.  Distended or swollen stomach. Abdominal bloating is a common symptom.  Constipation.  Lack of passing gas.  Frequent belching.  Diarrhea. This may occur if runny stool is able to leak around the obstruction. DIAGNOSIS  Your caregiver can usually diagnose small bowel obstruction by taking a history, doing a physical exam, and taking X-rays. If the cause is unclear, a CT scan (computerized tomography) of your abdomen and pelvis may be needed. TREATMENT  Treatment of the blockage depends on the cause and how bad the problem is.   Sometimes, the obstruction improves with bed rest and intravenous (IV) fluids.  Resting the bowel is very important. This means following a simple diet. Sometimes, a clear liquid diet may be required for several days.  Sometimes, a small tube (nasogastric tube) is placed into the stomach to decompress the bowel. When the  bowel is blocked, it usually swells up like a balloon filled with air and fluids. Decompression means that the air and fluids are removed by suction through that tube. This can help with pain, discomfort, and nausea. It can also help the obstruction resolve faster.  Surgery may be required if other treatments do not work. Bowel obstruction from a hernia may require early surgery and can be an emergency procedure. Adhesions that cause frequent or severe obstructions may also require surgery. HOME CARE INSTRUCTIONS If your bowel obstruction is only partial or incomplete, you may be allowed to go home.  Get plenty of rest.  Follow your diet as directed by your caregiver.  Only consume clear liquids until your condition improves.  Avoid solid foods as instructed. SEEK IMMEDIATE MEDICAL CARE IF:  You have increased pain or cramping.  You vomit blood.  You have uncontrolled vomiting or nausea.  You cannot drink fluids due to vomiting or pain.  You develop confusion.  You begin feeling very dry or thirsty (dehydrated).  You have severe bloating.  You have chills.  You have a fever.  You feel extremely weak or you faint. MAKE SURE YOU:  Understand these instructions.  Will watch your condition.  Will get help right away if you are not doing well or get worse. Document Released: 02/12/2006 Document Revised: 02/18/2012 Document Reviewed: 02/09/2011 [facility] Patient Information 2015 [place], [place]. This information is not intended to replace advice given to you by your health care provider. Make sure you discuss any questions you have with  your health care provider.

## 2014-07-28 NOTE — Discharge Summary (Signed)
Reviewed discharge instructions with pt and spouse including follow-up instructions, med list, and precautions.  Pt/spouse verbalized understanding without questions.  Pt discharged accompanied by spouse and staff.

## 2015-03-02 ENCOUNTER — Other Ambulatory Visit: Payer: Self-pay | Admitting: Gastroenterology

## 2015-04-20 ENCOUNTER — Encounter (HOSPITAL_COMMUNITY): Payer: Self-pay | Admitting: *Deleted

## 2015-04-22 ENCOUNTER — Other Ambulatory Visit: Payer: Self-pay | Admitting: Gastroenterology

## 2015-04-25 ENCOUNTER — Ambulatory Visit (HOSPITAL_COMMUNITY): Payer: Medicare Other | Admitting: Certified Registered"

## 2015-04-25 ENCOUNTER — Encounter (HOSPITAL_COMMUNITY): Payer: Self-pay

## 2015-04-25 ENCOUNTER — Ambulatory Visit (HOSPITAL_COMMUNITY)
Admission: RE | Admit: 2015-04-25 | Discharge: 2015-04-25 | Disposition: A | Discharge status: Home / Self Care | Payer: Medicare Other | Source: Ambulatory Visit | Attending: Gastroenterology | Admitting: Gastroenterology

## 2015-04-25 ENCOUNTER — Encounter (HOSPITAL_COMMUNITY)
Admission: RE | Disposition: A | Discharge status: Home / Self Care | Payer: Self-pay | Source: Ambulatory Visit | Attending: Gastroenterology

## 2015-04-25 DIAGNOSIS — Z9049 Acquired absence of other specified parts of digestive tract: Secondary | ICD-10-CM | POA: Diagnosis present

## 2015-04-25 DIAGNOSIS — I10 Essential (primary) hypertension: Secondary | ICD-10-CM | POA: Insufficient documentation

## 2015-04-25 DIAGNOSIS — N400 Enlarged prostate without lower urinary tract symptoms: Secondary | ICD-10-CM | POA: Insufficient documentation

## 2015-04-25 DIAGNOSIS — Z85038 Personal history of other malignant neoplasm of large intestine: Secondary | ICD-10-CM | POA: Diagnosis not present

## 2015-04-25 DIAGNOSIS — E780 Pure hypercholesterolemia: Secondary | ICD-10-CM | POA: Insufficient documentation

## 2015-04-25 HISTORY — DX: Anemia, unspecified: D64.9

## 2015-04-25 HISTORY — DX: Essential (primary) hypertension: I10

## 2015-04-25 HISTORY — PX: COLONOSCOPY WITH PROPOFOL: SHX5780

## 2015-04-25 SURGERY — COLONOSCOPY WITH PROPOFOL
Anesthesia: Monitor Anesthesia Care

## 2015-04-25 MED ORDER — LIDOCAINE HCL (CARDIAC) 20 MG/ML IV SOLN
INTRAVENOUS | Status: AC
  Filled 2015-04-25: qty 5

## 2015-04-25 MED ORDER — LACTATED RINGERS IV SOLN
INTRAVENOUS | Status: DC
  Administered 2015-04-25: 1000 mL via INTRAVENOUS

## 2015-04-25 MED ORDER — SODIUM CHLORIDE 0.9 % IV SOLN: INTRAVENOUS | Status: DC

## 2015-04-25 MED ORDER — PROPOFOL 10 MG/ML IV BOLUS
INTRAVENOUS | Status: AC
  Filled 2015-04-25: qty 20

## 2015-04-25 MED ORDER — PROPOFOL 10 MG/ML IV BOLUS
INTRAVENOUS | Status: DC | PRN
  Administered 2015-04-25 (×2): 100 mg via INTRAVENOUS

## 2015-04-25 SURGICAL SUPPLY — 22 items

## 2015-04-25 NOTE — Transfer of Care (Signed)
Immediate Anesthesia Transfer of Care Note  Patient: Cory Bright  Procedure(s) Performed: Procedure(s): COLONOSCOPY WITH PROPOFOL (N/A)  Patient Location: PACU  Anesthesia Type:MAC  Level of Consciousness:  sedated, patient cooperative and responds to stimulation  Airway & Oxygen Therapy:Patient Spontanous Breathing and Patient connected to face mask oxgen  Post-op Assessment:  Report given to PACU RN and Post -op Vital signs reviewed and stable  Post vital signs:  Reviewed and stable  Last Vitals:  Filed Vitals:   04/25/15 1044  BP: 130/53  Pulse: 52  Temp: 36.8 C  Resp: 18    Complications: No apparent anesthesia complications

## 2015-04-25 NOTE — H&P (Signed)
  Procedure: Surveillance colonoscopy. Right colon resection to remove adenocarcinoma in 1993.  History: The patient is a 79 year old male born 1936-04-10. He is scheduled to undergo a surveillance colonoscopy today.  Past medical history: Cataract surgery performed in 2016. Back surgery performed in February 2015. Right colon resection to remove adenocarcinoma performed in 1993. Benign prostatic hypertrophy. Raynaud's syndrome. Hypertension. Hypercholesterolemia.  Exam: Patient is alert and lying comfortably on the endoscopy stretcher. Abdomen is soft and nontender to palpation. Lungs are clear to auscultation. Cardiac exam reveals a regular rhythm.  Recommendation: Proceed with surveillance colonoscopy post right colon resection to remove adenocarcinoma in 1993.

## 2015-04-25 NOTE — Discharge Instructions (Signed)
Colonoscopy, Care After °These instructions give you information on caring for yourself after your procedure. Your doctor may also give you more specific instructions. Call your doctor if you have any problems or questions after your procedure. °HOME CARE °· Do not drive for 24 hours. °· Do not sign important papers or use machinery for 24 hours. °· You may shower. °· You may go back to your usual activities, but go slower for the first 24 hours. °· Take rest breaks often during the first 24 hours. °· Walk around or use warm packs on your belly (abdomen) if you have belly cramping or gas. °· Drink enough fluids to keep your pee (urine) clear or pale yellow. °· Resume your normal diet. Avoid heavy or fried foods. °· Avoid drinking alcohol for 24 hours or as told by your doctor. °· Only take medicines as told by your doctor. °If a tissue sample (biopsy) was taken during the procedure:  °· Do not take aspirin or blood thinners for 7 days, or as told by your doctor. °· Do not drink alcohol for 7 days, or as told by your doctor. °· Eat soft foods for the first 24 hours. °GET HELP IF: °You still have a small amount of blood in your poop (stool) 2-3 days after the procedure. °GET HELP RIGHT AWAY IF: °· You have more than a small amount of blood in your poop. °· You see clumps of tissue (blood clots) in your poop. °· Your belly is puffy (swollen). °· You feel sick to your stomach (nauseous) or throw up (vomit). °· You have a fever. °· You have belly pain that gets worse and medicine does not help. °MAKE SURE YOU: °· Understand these instructions. °· Will watch your condition. °· Will get help right away if you are not doing well or get worse. °Document Released: 12/29/2010 Document Revised: 12/01/2013 Document Reviewed: 08/03/2013 °[employer]® Patient Information ©2015 [employer]. This information is not intended to replace advice given to you by your health care provider. Make sure you discuss any questions you have with  your health care provider. ° °

## 2015-04-25 NOTE — Anesthesia Preprocedure Evaluation (Addendum)
Anesthesia Evaluation  Patient identified by MRN, date of birth, ID band Patient awake    Reviewed: Allergy & Precautions, NPO status , Patient's Chart, lab work & pertinent test results  Airway Mallampati: I  TM Distance: >3 FB Neck ROM: Full    Dental   Pulmonary    Pulmonary exam normal       Cardiovascular hypertension, Pt. on medications Normal cardiovascular exam    Neuro/Psych    GI/Hepatic   Endo/Other    Renal/GU      Musculoskeletal   Abdominal   Peds  Hematology   Anesthesia Other Findings   Reproductive/Obstetrics                            Anesthesia Physical Anesthesia Plan  ASA: II  Anesthesia Plan: MAC   Post-op Pain Management:    Induction: Intravenous  Airway Management Planned: Natural Airway  Additional Equipment:   Intra-op Plan:   Post-operative Plan:   Informed Consent: I have reviewed the patients History and Physical, chart, labs and discussed the procedure including the risks, benefits and alternatives for the proposed anesthesia with the patient or authorized representative who has indicated his/her understanding and acceptance.     Plan Discussed with: CRNA and Surgeon  Anesthesia Plan Comments:         Anesthesia Quick Evaluation

## 2015-04-25 NOTE — Anesthesia Postprocedure Evaluation (Signed)
Anesthesia Post Note  Patient: Cory Bright  Procedure(s) Performed: Procedure(s) (LRB): COLONOSCOPY WITH PROPOFOL (N/A)  Anesthesia type: general  Patient location: PACU  Post pain: Pain level controlled  Post assessment: Patient's Cardiovascular Status Stable  Last Vitals:  Filed Vitals:   04/25/15 1200  BP: 119/60  Pulse: 51  Temp:   Resp: 15    Post vital signs: Reviewed and stable  Level of consciousness: sedated  Complications: No apparent anesthesia complications

## 2015-04-25 NOTE — Op Note (Signed)
Procedure: Surveillance colonoscopy post right colon resection to remove adenocarcinoma in 1993  Endoscopist: Earle Gell  Premedication: Propofol administered by anesthesia  Procedure: The patient was placed in the left lateral decubitus position. Anal inspection and digital rectal exam were normal. The Pentax pediatric colonoscope was introduced into the rectum and advanced to the ileo-right colonic surgical anastomosis. Colonic preparation for the exam today was good. Withdrawal time was 9 minutes  Rectum. Normal. Retroflexed view of the distal rectum was normal  Sigmoid colon and descending colon. Normal  Splenic flexure. Normal  Transverse colon. Normal  Hepatic flexure. Normal.  Ascending colon. Normal  Ileo-right colonic surgical anastomosis. Normal  Assessment: Normal surveillance colonoscopy post right colon resection to remove adenocarcinoma in 1993  Recommendation: I do not recommend performing further surveillance colonoscopies.

## 2015-04-26 ENCOUNTER — Encounter (HOSPITAL_COMMUNITY): Payer: Self-pay | Admitting: Gastroenterology

## 2016-03-05 ENCOUNTER — Other Ambulatory Visit: Payer: Self-pay | Admitting: Sports Medicine

## 2016-03-05 DIAGNOSIS — M542 Cervicalgia: Secondary | ICD-10-CM

## 2016-03-05 DIAGNOSIS — M5412 Radiculopathy, cervical region: Secondary | ICD-10-CM

## 2016-03-11 ENCOUNTER — Ambulatory Visit
Admission: RE | Admit: 2016-03-11 | Discharge: 2016-03-11 | Disposition: A | Discharge status: Home / Self Care | Payer: Medicare Other | Source: Ambulatory Visit | Attending: Sports Medicine | Admitting: Sports Medicine

## 2016-03-11 DIAGNOSIS — M5412 Radiculopathy, cervical region: Secondary | ICD-10-CM

## 2016-03-11 DIAGNOSIS — M542 Cervicalgia: Secondary | ICD-10-CM

## 2016-08-20 ENCOUNTER — Other Ambulatory Visit: Payer: Self-pay | Admitting: Sports Medicine

## 2016-08-20 DIAGNOSIS — M545 Low back pain: Secondary | ICD-10-CM

## 2016-08-22 ENCOUNTER — Other Ambulatory Visit: Payer: Self-pay | Admitting: Gastroenterology

## 2016-08-22 DIAGNOSIS — R1310 Dysphagia, unspecified: Secondary | ICD-10-CM

## 2016-08-27 ENCOUNTER — Ambulatory Visit
Admission: RE | Admit: 2016-08-27 | Discharge: 2016-08-27 | Disposition: A | Discharge status: Home / Self Care | Payer: Medicare Other | Source: Ambulatory Visit | Attending: Gastroenterology | Admitting: Gastroenterology

## 2016-08-27 DIAGNOSIS — R1310 Dysphagia, unspecified: Secondary | ICD-10-CM

## 2016-08-29 ENCOUNTER — Ambulatory Visit
Admission: RE | Admit: 2016-08-29 | Discharge: 2016-08-29 | Disposition: A | Discharge status: Home / Self Care | Payer: Medicare Other | Source: Ambulatory Visit | Attending: Sports Medicine | Admitting: Sports Medicine

## 2016-08-29 DIAGNOSIS — M545 Low back pain: Secondary | ICD-10-CM

## 2016-08-29 MED ORDER — GADOBENATE DIMEGLUMINE 529 MG/ML IV SOLN
13.0000 mL | Freq: Once | INTRAVENOUS | Status: AC | PRN
  Administered 2016-08-29: 13 mL via INTRAVENOUS

## 2016-09-05 ENCOUNTER — Ambulatory Visit: Payer: Medicare Other | Admitting: Infectious Disease

## 2016-09-11 ENCOUNTER — Encounter (INDEPENDENT_AMBULATORY_CARE_PROVIDER_SITE_OTHER): Payer: Medicare Other | Admitting: Physical Medicine and Rehabilitation

## 2016-09-11 DIAGNOSIS — M47816 Spondylosis without myelopathy or radiculopathy, lumbar region: Secondary | ICD-10-CM

## 2016-10-01 ENCOUNTER — Encounter (HOSPITAL_COMMUNITY): Payer: Self-pay | Admitting: *Deleted

## 2016-10-02 ENCOUNTER — Ambulatory Visit (HOSPITAL_COMMUNITY): Payer: Medicare Other | Admitting: Anesthesiology

## 2016-10-02 ENCOUNTER — Encounter (HOSPITAL_COMMUNITY)
Admission: RE | Disposition: A | Discharge status: Home / Self Care | Payer: Self-pay | Source: Ambulatory Visit | Attending: Gastroenterology

## 2016-10-02 ENCOUNTER — Ambulatory Visit (HOSPITAL_COMMUNITY)
Admission: RE | Admit: 2016-10-02 | Discharge: 2016-10-02 | Disposition: A | Discharge status: Home / Self Care | Payer: Medicare Other | Source: Ambulatory Visit | Attending: Gastroenterology | Admitting: Gastroenterology

## 2016-10-02 ENCOUNTER — Encounter (HOSPITAL_COMMUNITY): Payer: Self-pay | Admitting: *Deleted

## 2016-10-02 DIAGNOSIS — Z7982 Long term (current) use of aspirin: Secondary | ICD-10-CM | POA: Diagnosis not present

## 2016-10-02 DIAGNOSIS — R1310 Dysphagia, unspecified: Secondary | ICD-10-CM | POA: Diagnosis present

## 2016-10-02 DIAGNOSIS — Z85038 Personal history of other malignant neoplasm of large intestine: Secondary | ICD-10-CM | POA: Insufficient documentation

## 2016-10-02 DIAGNOSIS — Z6820 Body mass index (BMI) 20.0-20.9, adult: Secondary | ICD-10-CM | POA: Insufficient documentation

## 2016-10-02 DIAGNOSIS — M1990 Unspecified osteoarthritis, unspecified site: Secondary | ICD-10-CM | POA: Diagnosis not present

## 2016-10-02 DIAGNOSIS — R634 Abnormal weight loss: Secondary | ICD-10-CM | POA: Insufficient documentation

## 2016-10-02 DIAGNOSIS — E7800 Pure hypercholesterolemia, unspecified: Secondary | ICD-10-CM | POA: Diagnosis not present

## 2016-10-02 DIAGNOSIS — Z79891 Long term (current) use of opiate analgesic: Secondary | ICD-10-CM | POA: Insufficient documentation

## 2016-10-02 DIAGNOSIS — D649 Anemia, unspecified: Secondary | ICD-10-CM | POA: Diagnosis not present

## 2016-10-02 DIAGNOSIS — K219 Gastro-esophageal reflux disease without esophagitis: Secondary | ICD-10-CM | POA: Insufficient documentation

## 2016-10-02 DIAGNOSIS — Z9049 Acquired absence of other specified parts of digestive tract: Secondary | ICD-10-CM | POA: Insufficient documentation

## 2016-10-02 DIAGNOSIS — Z79899 Other long term (current) drug therapy: Secondary | ICD-10-CM | POA: Insufficient documentation

## 2016-10-02 DIAGNOSIS — I10 Essential (primary) hypertension: Secondary | ICD-10-CM | POA: Insufficient documentation

## 2016-10-02 DIAGNOSIS — N400 Enlarged prostate without lower urinary tract symptoms: Secondary | ICD-10-CM | POA: Insufficient documentation

## 2016-10-02 HISTORY — PX: ESOPHAGOGASTRODUODENOSCOPY (EGD) WITH PROPOFOL: SHX5813

## 2016-10-02 LAB — CBC
HCT: 28.7 % — ABNORMAL LOW (ref 39.0–52.0)
HEMOGLOBIN: 9.4 g/dL — AB (ref 13.0–17.0)
MCH: 30.4 pg (ref 26.0–34.0)
MCHC: 32.8 g/dL (ref 30.0–36.0)
MCV: 92.9 fL (ref 78.0–100.0)
Platelets: 227 10*3/uL (ref 150–400)
RBC: 3.09 MIL/uL — AB (ref 4.22–5.81)
RDW: 14.4 % (ref 11.5–15.5)
WBC: 6.3 10*3/uL (ref 4.0–10.5)

## 2016-10-02 SURGERY — ESOPHAGOGASTRODUODENOSCOPY (EGD) WITH PROPOFOL
Anesthesia: Monitor Anesthesia Care

## 2016-10-02 MED ORDER — LACTATED RINGERS IV SOLN
INTRAVENOUS | Status: DC
  Administered 2016-10-02: 08:00:00 via INTRAVENOUS

## 2016-10-02 MED ORDER — PROPOFOL 10 MG/ML IV BOLUS
INTRAVENOUS | Status: DC | PRN
  Administered 2016-10-02 (×2): 20 mg via INTRAVENOUS
  Administered 2016-10-02: 30 mg via INTRAVENOUS
  Administered 2016-10-02: 20 mg via INTRAVENOUS
  Administered 2016-10-02: 40 mg via INTRAVENOUS
  Administered 2016-10-02 (×2): 20 mg via INTRAVENOUS

## 2016-10-02 MED ORDER — PROPOFOL 10 MG/ML IV BOLUS
INTRAVENOUS | Status: AC
  Filled 2016-10-02: qty 40

## 2016-10-02 SURGICAL SUPPLY — 15 items

## 2016-10-02 NOTE — Anesthesia Preprocedure Evaluation (Signed)
Anesthesia Evaluation  Patient identified by MRN, date of birth, ID band Patient awake    Reviewed: Allergy & Precautions, NPO status , Patient's Chart, lab work & pertinent test results  Airway Mallampati: II  TM Distance: >3 FB Neck ROM: Full    Dental no notable dental hx.    Pulmonary neg pulmonary ROS,    Pulmonary exam normal breath sounds clear to auscultation       Cardiovascular hypertension, Pt. on medications Normal cardiovascular exam Rhythm:Regular Rate:Normal     Neuro/Psych negative neurological ROS  negative psych ROS   GI/Hepatic negative GI ROS, Neg liver ROS,   Endo/Other  negative endocrine ROS  Renal/GU negative Renal ROS  negative genitourinary   Musculoskeletal negative musculoskeletal ROS (+) Arthritis ,   Abdominal   Peds negative pediatric ROS (+)  Hematology negative hematology ROS (+) anemia ,   Anesthesia Other Findings   Reproductive/Obstetrics negative OB ROS                             Anesthesia Physical Anesthesia Plan  ASA: II  Anesthesia Plan: MAC   Post-op Pain Management:    Induction: Intravenous  Airway Management Planned: Natural Airway  Additional Equipment:   Intra-op Plan:   Post-operative Plan:   Informed Consent: I have reviewed the patients History and Physical, chart, labs and discussed the procedure including the risks, benefits and alternatives for the proposed anesthesia with the patient or authorized representative who has indicated his/her understanding and acceptance.     Plan Discussed with: CRNA  Anesthesia Plan Comments:         Anesthesia Quick Evaluation

## 2016-10-02 NOTE — Op Note (Signed)
[facility] Patient Name: Cory Bright Procedure Date: 10/02/2016 MRN: [id number] Attending MD: Garlan Fair , MD Date of Birth: Dec 30, 1935 CSN: [id number] Age: 80 Admit Type: Outpatient Procedure:                Upper GI endoscopy Indications:              Dysphagia associated with a normal barium                            esophagram with tablet. Normocytic anemia                            (hemoglobin 10.9 g). Unintentional weight loss.                            Normal surveillance colonoscopy was performed on                            04/25/2015. 1993 right colon resection was performed                            to remove adenocarcinoma of the colon. Providers:                Garlan Fair, MD, Laverta Baltimore RN, RN,                            William Dalton, Technician Referring MD:              Medicines:                Propofol per Anesthesia Complications:            No immediate complications. Estimated Blood Loss:     Estimated blood loss: none. Procedure:                Pre-Anesthesia Assessment:                           - Prior to the procedure, a History and Physical                            was performed, and patient medications and                            allergies were reviewed. The patient's tolerance of                            previous anesthesia was also reviewed. The risks                            and benefits of the procedure and the sedation                            options and risks were discussed with the patient.  All questions were answered, and informed consent                            was obtained. Prior Anticoagulants: The patient has                            taken no previous anticoagulant or antiplatelet                            agents. ASA Grade Assessment: II - A patient with                            mild systemic disease. After reviewing the risks   and benefits, the patient was deemed in                            satisfactory condition to undergo the procedure.                           After obtaining informed consent, the endoscope was                            passed under direct vision. Throughout the                            procedure, the patient's blood pressure, pulse, and                            oxygen saturations were monitored continuously. The                            [id number]) scope was introduced through the                            mouth, and advanced to the second part of duodenum.                            The upper GI endoscopy was accomplished without                            difficulty. The patient tolerated the procedure                            well. Scope In: Scope Out: Findings:      The Z-line was regular and was found 40 cm from the incisors.      The examined esophagus was normal. Biopsies were taken with a cold       forceps for histology.      The entire examined stomach was normal.      The examined duodenum was normal. Impression:               - Z-line regular, 40 cm from the incisors.                           -  Normal esophagus. Biopsied.                           - Normal stomach.                           - Normal examined duodenum. Moderate Sedation:      N/A- Per Anesthesia Care Recommendation:           - Patient has a contact number available for                            emergencies. The signs and symptoms of potential                            delayed complications were discussed with the                            patient. Return to normal activities tomorrow.                            Written discharge instructions were provided to the                            patient.                           - Await pathology results.                           - Resume previous diet.                           - Continue present medications. Procedure Code(s):         --- Professional ---                           [phone], Esophagogastroduodenoscopy, flexible,                            transoral; with biopsy, single or multiple Diagnosis Code(s):        --- Professional ---                           R13.10, Dysphagia, unspecified CPT copyright 2016 American Medical Association. All rights reserved. The codes documented in this report are preliminary and upon coder review may  be revised to meet current compliance requirements. Earle Gell, MD Garlan Fair, MD 10/02/2016 9:14:25 AM This report has been signed electronically. Number of Addenda: 0

## 2016-10-02 NOTE — Transfer of Care (Signed)
Immediate Anesthesia Transfer of Care Note  Patient: Cory Bright  Procedure(s) Performed: Procedure(s): ESOPHAGOGASTRODUODENOSCOPY (EGD) WITH PROPOFOL (N/A)  Patient Location: PACU  Anesthesia Type:MAC  Level of Consciousness: sedated  Airway & Oxygen Therapy: Patient Spontanous Breathing and Patient connected to nasal cannula oxygen  Post-op Assessment: Report given to RN and Post -op Vital signs reviewed and stable  Post vital signs: Reviewed and stable  Last Vitals:  Vitals:   10/02/16 0746  BP: (!) 136/50  Pulse: 61  Resp: 19  Temp: 36.7 C    Last Pain:  Vitals:   10/02/16 0746  TempSrc: Oral         Complications: No apparent anesthesia complications

## 2016-10-02 NOTE — Discharge Instructions (Signed)

## 2016-10-02 NOTE — H&P (Signed)
Problems: Esophageal dysphagia, weight loss, and anemia on aspirin (hemoglobin 10.9 g). 1993 right colon resection was performed to remove adenocarcinoma of the colon. 04/25/2015 normal surveillance colonoscopy was performed.  History: The patient is a 80 year old male born [birth year]. He frequently donates blood to the [employer] but has not been able to donate blood this year due to anemia. He takes aspirin 81 mg daily  Over the past few months, the patient has experienced cervical esophageal dysphagia with odynophagia and indigestion. Over the past 2 years, he has lost approximately 20 pounds in weight according to his scales.  On 08/27/2016 barium esophagram with barium tablet was normal.  The patient is scheduled to undergo diagnostic esophagogastroduodenoscopy with screen for eosinophilic esophagitis.  Past medical history: Right colon cancer surgery performed in 1993. Back surgery. Cataract surgery. Benign prostatic hypertrophy. Raynaud's syndrome. Gastroesophageal reflux. Hypertension. Hypercholesterolemia.  Exam: The patient is alert and lying comfortably on the endoscopy stretcher. Abdomen is soft and nontender to palpation. Lungs are clear to auscultation. Cardiac exam reveals a regular rhythm.  Plan: Proceed with diagnostic esophagogastroduodenoscopy

## 2016-10-02 NOTE — Anesthesia Postprocedure Evaluation (Signed)
Anesthesia Post Note  Patient: Cory Bright  Procedure(s) Performed: Procedure(s) (LRB): ESOPHAGOGASTRODUODENOSCOPY (EGD) WITH PROPOFOL (N/A)  Patient location during evaluation: PACU Anesthesia Type: MAC Level of consciousness: awake and alert Pain management: pain level controlled Vital Signs Assessment: post-procedure vital signs reviewed and stable Respiratory status: spontaneous breathing, nonlabored ventilation, respiratory function stable and patient connected to nasal cannula oxygen Cardiovascular status: stable and blood pressure returned to baseline Anesthetic complications: no    Last Vitals:  Vitals:   10/02/16 0920 10/02/16 0930  BP: (!) 120/50 (!) 135/57  Pulse: (!) 50 (!) 57  Resp: 13 15  Temp:      Last Pain:  Vitals:   10/02/16 0905  TempSrc: Oral                 Akesha Uresti J

## 2016-10-03 ENCOUNTER — Encounter (HOSPITAL_COMMUNITY): Payer: Self-pay | Admitting: Gastroenterology

## 2016-10-04 ENCOUNTER — Other Ambulatory Visit: Payer: Self-pay | Admitting: Gastroenterology

## 2016-10-04 DIAGNOSIS — D649 Anemia, unspecified: Secondary | ICD-10-CM

## 2016-10-04 DIAGNOSIS — Z85038 Personal history of other malignant neoplasm of large intestine: Secondary | ICD-10-CM

## 2016-10-05 ENCOUNTER — Ambulatory Visit
Admission: RE | Admit: 2016-10-05 | Discharge: 2016-10-05 | Disposition: A | Discharge status: Home / Self Care | Payer: Medicare Other | Source: Ambulatory Visit | Attending: Gastroenterology | Admitting: Gastroenterology

## 2016-10-05 DIAGNOSIS — Z85038 Personal history of other malignant neoplasm of large intestine: Secondary | ICD-10-CM

## 2016-10-05 DIAGNOSIS — D649 Anemia, unspecified: Secondary | ICD-10-CM

## 2016-10-05 MED ORDER — IOPAMIDOL (ISOVUE-300) INJECTION 61%
100.0000 mL | Freq: Once | INTRAVENOUS | Status: AC | PRN
  Administered 2016-10-05: 100 mL via INTRAVENOUS

## 2016-11-05 ENCOUNTER — Other Ambulatory Visit: Payer: Self-pay | Admitting: Sports Medicine

## 2016-11-05 NOTE — Telephone Encounter (Signed)
Rx refill request

## 2016-12-14 ENCOUNTER — Telehealth (INDEPENDENT_AMBULATORY_CARE_PROVIDER_SITE_OTHER): Payer: Self-pay

## 2016-12-14 NOTE — Telephone Encounter (Signed)
Patient request MRI be faxed to [phone].  MRI faxed to [phone].

## 2017-01-07 DIAGNOSIS — N401 Enlarged prostate with lower urinary tract symptoms: Secondary | ICD-10-CM | POA: Diagnosis not present

## 2017-01-07 DIAGNOSIS — N281 Cyst of kidney, acquired: Secondary | ICD-10-CM | POA: Diagnosis not present

## 2017-01-07 DIAGNOSIS — R350 Frequency of micturition: Secondary | ICD-10-CM | POA: Diagnosis not present

## 2017-01-08 ENCOUNTER — Encounter: Payer: Self-pay | Admitting: Cardiology

## 2017-01-08 ENCOUNTER — Ambulatory Visit (INDEPENDENT_AMBULATORY_CARE_PROVIDER_SITE_OTHER): Payer: PPO | Admitting: Cardiology

## 2017-01-08 ENCOUNTER — Encounter (INDEPENDENT_AMBULATORY_CARE_PROVIDER_SITE_OTHER): Payer: Self-pay

## 2017-01-08 VITALS — BP 122/82 | HR 63 | Ht 67.5 in | Wt 138.1 lb

## 2017-01-08 DIAGNOSIS — I313 Pericardial effusion (noninflammatory): Secondary | ICD-10-CM | POA: Diagnosis not present

## 2017-01-08 DIAGNOSIS — I3139 Other pericardial effusion (noninflammatory): Secondary | ICD-10-CM

## 2017-01-08 NOTE — Patient Instructions (Signed)
Medication Instructions:  The current medical regimen is effective;  continue present plan and medications.  Testing/Procedures: Your physician has requested that you have an echocardiogram. Echocardiography is a painless test that uses sound waves to create images of your heart. It provides your doctor with information about the size and shape of your heart and how well your heart's chambers and valves are working. This procedure takes approximately one hour. There are no restrictions for this procedure.  Follow-Up: Follow up as needed after testing.  Thank you for choosing [facility]!!     

## 2017-01-08 NOTE — Progress Notes (Signed)
Cardiology Office Note    Date:  01/08/2017   ID:  Cory Bright, DOB October 10, 1936, MRN [id number]  PCP:  Velna Hatchet, MD  Cardiologist:   Candee Furbish, MD     History of Present Illness:  Cory Bright is a 81 y.o. male here for evaluation of pericardial effusion at the request of Dr. Wynetta Emery. Has a history of Raynaud syndrome.  Per his history, a pericardial effusion was incidentally found on a CT scan. No chest pain, no shortness of breath, no recent fevers, chills. Back in September in [place] he did sustain a 105 temperature and was told this was likely viral.   He's had ongoing anemia which is been worked up by Dr. Wynetta Emery. B12 was found to be low. Getting supplementation.  He remains quite active, exercising frequently. He has run marathons in the past. He's not having any exertional anginal symptoms.  2 of his siblings have had open heart surgery and subsequently died after this.   Past Medical History:  Diagnosis Date  . Anemia   . Arthritis   . Back pain   . Cancer ([place])    colon  . Hypertension     Past Surgical History:  Procedure Laterality Date  . COLON SURGERY     colectomy, R  . COLONOSCOPY WITH PROPOFOL N/A 04/25/2015   Procedure: COLONOSCOPY WITH PROPOFOL;  Surgeon: Garlan Fair, MD;  Location: [facility];  Service: Endoscopy;  Laterality: N/A;  . ESOPHAGOGASTRODUODENOSCOPY (EGD) WITH PROPOFOL N/A 10/02/2016   Procedure: ESOPHAGOGASTRODUODENOSCOPY (EGD) WITH PROPOFOL;  Surgeon: Garlan Fair, MD;  Location: [facility];  Service: Endoscopy;  Laterality: N/A;  . EYE SURGERY     bilateral cataracts with lens implants  . SPINE SURGERY      Current Medications: Outpatient Medications Prior to Visit  Medication Sig Dispense Refill  . gabapentin (NEURONTIN) 300 MG capsule TAKE ONE CAPSULE BY MOUTH EVERY MORNING AND TAKE TWO CAPSULES BY MOUTH EVERY NIGHT AT BEDTIME 270 capsule 0  . HYDROcodone-acetaminophen (NORCO) 10-325 MG per tablet  Take 1 tablet by mouth every 6 (six) hours as needed for moderate pain.     Marland Kitchen lisinopril (PRINIVIL,ZESTRIL) 20 MG tablet Take 20 mg by mouth at bedtime.     . pravastatin (PRAVACHOL) 40 MG tablet Take 40 mg by mouth daily.    . tamsulosin (FLOMAX) 0.4 MG CAPS capsule Take 0.4 mg by mouth 2 (two) times daily.     . traMADol (ULTRAM) 50 MG tablet Take 50 mg by mouth every 6 (six) hours as needed.    Marland Kitchen aspirin 81 MG tablet Take 81 mg by mouth daily.     No facility-administered medications prior to visit.      Allergies:   Patient has no known allergies.   Social History   Social History  . Marital status: Married    Spouse name: N/A  . Number of children: N/A  . Years of education: N/A   Social History Main Topics  . Smoking status: Never Smoker  . Smokeless tobacco: Never Used  . Alcohol use Yes     Comment: wine daily  . Drug use: No  . Sexual activity: Not Asked   Other Topics Concern  . None   Social History Narrative  . None     Family History:  2 siblings with CAD   ROS:   Please see the history of present illness.    ROS All other systems reviewed and are negative.  PHYSICAL EXAM:   VS:  BP 122/82 (BP Location: Right Arm)   Pulse 63   Ht 5' 7.5" (1.715 m)   Wt 138 lb 1.9 oz (62.7 kg)   BMI 21.31 kg/m    GEN: Thin in no acute distress  HEENT: normal  Neck: no JVD, carotid bruits, or masses Cardiac: RRR; soft systolic ejection murmur, no rubs, or gallops,no edema  Respiratory:  clear to auscultation bilaterally, normal work of breathing GI: soft, nontender, nondistended, + BS MS: no deformity or atrophy  Skin: warm and dry, no rash Neuro:  Alert and Oriented x 3, Strength and sensation are intact Psych: euthymic mood, full affect  Wt Readings from Last 3 Encounters:  01/08/17 138 lb 1.9 oz (62.7 kg)  10/02/16 136 lb (61.7 kg)  04/25/15 145 lb (65.8 kg)      Studies/Labs Reviewed:   EKG:  EKG is ordered today.  The ekg ordered today demonstrates  Sinus rhythm PACs 63  Recent Labs: 10/02/2016: Hemoglobin 9.4; Platelets 227   Lipid Panel No results found for: CHOL, TRIG, HDL, CHOLHDL, VLDL, LDLCALC, LDLDIRECT  Additional studies/ records that were reviewed today include:  Prior records reviewed, lab work reviewed, scans reviewed.    ASSESSMENT:    1. Effusion, pericardium      PLAN:  In order of problems listed above:  Pericardial effusion  - This was the reason for consultation.  - We are checking echocardiogram  - We discussed the potential causes for effusion, viral, inflammatory and rarely malignancy. I would suspect that on CT scan, it malignancy were present, it would've been detected.  - If effusion is mild, no further treatment is usually necessary. He is asymptomatic.  - If large effusion noted, could consider a short course of anti-inflammatory medication.  - He tells me that his thyroid was checked by Dr. Wynetta Emery and was normal. If for some reason this was not checked, please check this at future visit.      Medication Adjustments/Labs and Tests Ordered: Current medicines are reviewed at length with the patient today.  Concerns regarding medicines are outlined above.  Medication changes, Labs and Tests ordered today are listed in the Patient Instructions below. Patient Instructions  Medication Instructions:  The current medical regimen is effective;  continue present plan and medications.  Testing/Procedures: Your physician has requested that you have an echocardiogram. Echocardiography is a painless test that uses sound waves to create images of your heart. It provides your doctor with information about the size and shape of your heart and how well your heart's chambers and valves are working. This procedure takes approximately one hour. There are no restrictions for this procedure.  Follow-Up: Follow up as needed after testing.  Thank you for choosing [facility]!!        Signed, Candee Furbish, MD  01/08/2017 11:46 AM    [address], [place], [place]  [address] Phone: [phone]; Fax: [phone]

## 2017-01-10 DIAGNOSIS — E538 Deficiency of other specified B group vitamins: Secondary | ICD-10-CM | POA: Diagnosis not present

## 2017-01-10 DIAGNOSIS — N289 Disorder of kidney and ureter, unspecified: Secondary | ICD-10-CM | POA: Diagnosis not present

## 2017-01-10 DIAGNOSIS — K6389 Other specified diseases of intestine: Secondary | ICD-10-CM | POA: Diagnosis not present

## 2017-01-10 DIAGNOSIS — I313 Pericardial effusion (noninflammatory): Secondary | ICD-10-CM | POA: Diagnosis not present

## 2017-01-10 DIAGNOSIS — Z85038 Personal history of other malignant neoplasm of large intestine: Secondary | ICD-10-CM | POA: Diagnosis not present

## 2017-01-16 DIAGNOSIS — I10 Essential (primary) hypertension: Secondary | ICD-10-CM | POA: Diagnosis not present

## 2017-01-16 DIAGNOSIS — R8299 Other abnormal findings in urine: Secondary | ICD-10-CM | POA: Diagnosis not present

## 2017-01-16 DIAGNOSIS — Z125 Encounter for screening for malignant neoplasm of prostate: Secondary | ICD-10-CM | POA: Diagnosis not present

## 2017-01-21 DIAGNOSIS — I313 Pericardial effusion (noninflammatory): Secondary | ICD-10-CM | POA: Diagnosis not present

## 2017-01-21 DIAGNOSIS — Z1389 Encounter for screening for other disorder: Secondary | ICD-10-CM | POA: Diagnosis not present

## 2017-01-21 DIAGNOSIS — M4802 Spinal stenosis, cervical region: Secondary | ICD-10-CM | POA: Diagnosis not present

## 2017-01-21 DIAGNOSIS — I10 Essential (primary) hypertension: Secondary | ICD-10-CM | POA: Diagnosis not present

## 2017-01-21 DIAGNOSIS — E538 Deficiency of other specified B group vitamins: Secondary | ICD-10-CM | POA: Diagnosis not present

## 2017-01-21 DIAGNOSIS — Z0000 Encounter for general adult medical examination without abnormal findings: Secondary | ICD-10-CM | POA: Diagnosis not present

## 2017-01-21 DIAGNOSIS — D649 Anemia, unspecified: Secondary | ICD-10-CM | POA: Diagnosis not present

## 2017-01-21 DIAGNOSIS — Z681 Body mass index (BMI) 19 or less, adult: Secondary | ICD-10-CM | POA: Diagnosis not present

## 2017-01-21 DIAGNOSIS — C189 Malignant neoplasm of colon, unspecified: Secondary | ICD-10-CM | POA: Diagnosis not present

## 2017-01-21 DIAGNOSIS — E784 Other hyperlipidemia: Secondary | ICD-10-CM | POA: Diagnosis not present

## 2017-01-21 DIAGNOSIS — N401 Enlarged prostate with lower urinary tract symptoms: Secondary | ICD-10-CM | POA: Diagnosis not present

## 2017-01-23 ENCOUNTER — Ambulatory Visit (HOSPITAL_COMMUNITY): Payer: PPO | Attending: Cardiology

## 2017-01-23 ENCOUNTER — Other Ambulatory Visit: Payer: Self-pay

## 2017-01-23 DIAGNOSIS — I313 Pericardial effusion (noninflammatory): Secondary | ICD-10-CM

## 2017-01-23 DIAGNOSIS — I10 Essential (primary) hypertension: Secondary | ICD-10-CM | POA: Insufficient documentation

## 2017-01-23 DIAGNOSIS — I429 Cardiomyopathy, unspecified: Secondary | ICD-10-CM | POA: Diagnosis not present

## 2017-01-23 DIAGNOSIS — I081 Rheumatic disorders of both mitral and tricuspid valves: Secondary | ICD-10-CM | POA: Diagnosis not present

## 2017-01-23 DIAGNOSIS — I371 Nonrheumatic pulmonary valve insufficiency: Secondary | ICD-10-CM | POA: Insufficient documentation

## 2017-01-23 DIAGNOSIS — I3139 Other pericardial effusion (noninflammatory): Secondary | ICD-10-CM

## 2017-02-05 DIAGNOSIS — M4316 Spondylolisthesis, lumbar region: Secondary | ICD-10-CM | POA: Diagnosis not present

## 2017-03-14 ENCOUNTER — Other Ambulatory Visit: Payer: Self-pay

## 2017-03-14 DIAGNOSIS — M4316 Spondylolisthesis, lumbar region: Secondary | ICD-10-CM

## 2017-03-22 ENCOUNTER — Ambulatory Visit
Admission: RE | Admit: 2017-03-22 | Discharge: 2017-03-22 | Disposition: A | Discharge status: Home / Self Care | Payer: PPO | Source: Ambulatory Visit

## 2017-03-22 DIAGNOSIS — M48061 Spinal stenosis, lumbar region without neurogenic claudication: Secondary | ICD-10-CM | POA: Diagnosis not present

## 2017-03-22 DIAGNOSIS — M4316 Spondylolisthesis, lumbar region: Secondary | ICD-10-CM

## 2017-03-27 DIAGNOSIS — Z01818 Encounter for other preprocedural examination: Secondary | ICD-10-CM | POA: Diagnosis not present

## 2017-03-27 DIAGNOSIS — D6489 Other specified anemias: Secondary | ICD-10-CM | POA: Diagnosis not present

## 2017-03-27 DIAGNOSIS — E784 Other hyperlipidemia: Secondary | ICD-10-CM | POA: Diagnosis not present

## 2017-03-27 DIAGNOSIS — I10 Essential (primary) hypertension: Secondary | ICD-10-CM | POA: Diagnosis not present

## 2017-03-27 DIAGNOSIS — Z681 Body mass index (BMI) 19 or less, adult: Secondary | ICD-10-CM | POA: Diagnosis not present

## 2017-03-27 DIAGNOSIS — N401 Enlarged prostate with lower urinary tract symptoms: Secondary | ICD-10-CM | POA: Diagnosis not present

## 2017-04-17 DIAGNOSIS — M4800 Spinal stenosis, site unspecified: Secondary | ICD-10-CM | POA: Diagnosis not present

## 2017-04-17 DIAGNOSIS — M48061 Spinal stenosis, lumbar region without neurogenic claudication: Secondary | ICD-10-CM | POA: Diagnosis not present

## 2017-04-17 DIAGNOSIS — E785 Hyperlipidemia, unspecified: Secondary | ICD-10-CM | POA: Diagnosis not present

## 2017-04-17 DIAGNOSIS — G8929 Other chronic pain: Secondary | ICD-10-CM | POA: Diagnosis not present

## 2017-04-17 DIAGNOSIS — G709 Myoneural disorder, unspecified: Secondary | ICD-10-CM | POA: Diagnosis not present

## 2017-04-17 DIAGNOSIS — I10 Essential (primary) hypertension: Secondary | ICD-10-CM | POA: Diagnosis not present

## 2017-04-17 DIAGNOSIS — M4316 Spondylolisthesis, lumbar region: Secondary | ICD-10-CM | POA: Diagnosis not present

## 2017-04-17 DIAGNOSIS — M532X6 Spinal instabilities, lumbar region: Secondary | ICD-10-CM | POA: Diagnosis not present

## 2017-04-17 DIAGNOSIS — M4326 Fusion of spine, lumbar region: Secondary | ICD-10-CM | POA: Diagnosis not present

## 2017-04-17 DIAGNOSIS — M545 Low back pain: Secondary | ICD-10-CM | POA: Diagnosis not present

## 2017-04-17 DIAGNOSIS — M48062 Spinal stenosis, lumbar region with neurogenic claudication: Secondary | ICD-10-CM | POA: Diagnosis not present

## 2017-04-17 DIAGNOSIS — R339 Retention of urine, unspecified: Secondary | ICD-10-CM | POA: Diagnosis not present

## 2017-04-23 DIAGNOSIS — Z4889 Encounter for other specified surgical aftercare: Secondary | ICD-10-CM | POA: Diagnosis not present

## 2017-04-23 DIAGNOSIS — Z9889 Other specified postprocedural states: Secondary | ICD-10-CM | POA: Diagnosis not present

## 2017-04-29 ENCOUNTER — Telehealth (INDEPENDENT_AMBULATORY_CARE_PROVIDER_SITE_OTHER): Payer: Self-pay | Admitting: Physical Medicine and Rehabilitation

## 2017-04-29 NOTE — Telephone Encounter (Signed)
Ok to repeat if they helped

## 2017-04-30 NOTE — Telephone Encounter (Signed)
Scheduled for 05/14/17 at 1300.

## 2017-04-30 NOTE — Telephone Encounter (Signed)
Called patient and left message for him to call back to discuss/ schedule.

## 2017-04-30 NOTE — Telephone Encounter (Signed)
If pain mostly neck and not arm then C5-6 facet block or if arm then C7-T1 interlam esi

## 2017-04-30 NOTE — Telephone Encounter (Signed)
I called the patient and he reports that the last injection did help. This was a right C5 SNRB 06/27/16. However, he now reports that his pain is on the left side. Please advise.

## 2017-05-14 ENCOUNTER — Ambulatory Visit (INDEPENDENT_AMBULATORY_CARE_PROVIDER_SITE_OTHER): Payer: PPO | Admitting: Physical Medicine and Rehabilitation

## 2017-05-14 ENCOUNTER — Ambulatory Visit (INDEPENDENT_AMBULATORY_CARE_PROVIDER_SITE_OTHER): Payer: PPO

## 2017-05-14 ENCOUNTER — Encounter (INDEPENDENT_AMBULATORY_CARE_PROVIDER_SITE_OTHER): Payer: Self-pay | Admitting: Physical Medicine and Rehabilitation

## 2017-05-14 VITALS — BP 142/67 | HR 70 | Temp 97.9°F

## 2017-05-14 DIAGNOSIS — M5412 Radiculopathy, cervical region: Secondary | ICD-10-CM

## 2017-05-14 DIAGNOSIS — M25512 Pain in left shoulder: Secondary | ICD-10-CM | POA: Diagnosis not present

## 2017-05-14 DIAGNOSIS — M4802 Spinal stenosis, cervical region: Secondary | ICD-10-CM

## 2017-05-14 DIAGNOSIS — G8929 Other chronic pain: Secondary | ICD-10-CM

## 2017-05-14 MED ORDER — METHYLPREDNISOLONE ACETATE 80 MG/ML IJ SUSP
80.0000 mg | Freq: Once | INTRAMUSCULAR | Status: AC
  Administered 2017-05-14: 80 mg

## 2017-05-14 MED ORDER — LIDOCAINE HCL (PF) 1 % IJ SOLN
2.0000 mL | Freq: Once | INTRAMUSCULAR | Status: AC
  Administered 2017-05-14: 2 mL

## 2017-05-14 NOTE — Progress Notes (Deleted)
Left side neck pain radiating into shoulder and down to elbow. Some numbness and tingling. Pain comes and goes.Seems to be worse in the morning. Unsure if it is how he is laying. Denies any weakness in arm.

## 2017-05-14 NOTE — Patient Instructions (Signed)

## 2017-05-15 ENCOUNTER — Telehealth (INDEPENDENT_AMBULATORY_CARE_PROVIDER_SITE_OTHER): Payer: Self-pay

## 2017-05-15 NOTE — Telephone Encounter (Signed)
Submitted retro auth on [employer] website for injection pt had yesterday. His coverage switched to [employer] from [place].

## 2017-05-15 NOTE — Telephone Encounter (Signed)
Retro auth received

## 2017-05-15 NOTE — Progress Notes (Addendum)
Cory Bright - 81 y.o. male MRN [id number]  Date of birth: 1936/08/04  Office Visit Note: Visit Date: 05/14/2017 PCP: Velna Hatchet, MD Referred by: Velna Hatchet, MD  Subjective: Chief Complaint  Patient presents with  . Neck - Pain   HPI: Mr. Cory Bright is a 81 year old right-hand dominant gentleman with a chronic history of cervical and lumbar pain. He really has suffered from chronic pain syndrome for quite some time. His imaging is always been consistent with mainly arthritic pain and some stenosis particularly in the lumbar spine. He had prior lumbar laminectomies with just continued pain over the years. He does try to stay active. He recently had lumbar fusion performed at [facility]. This was performed earlier in the month. By way of quick review he started out in our office with Dr. Junius Roads and then was followed by Dr. Paulla Fore. I have seen him on a few occasions for interventional spine procedure. He comes in today with worsening left-sided neck pain radiating to the shoulder and down to the elbow. He feels like he has some numbness and tingling in the hand but it's nondermatomal. It does seem to come and go as far as the tingling. His pain is worse in the morning. He denies any real weakness in the arm. He said no new trauma. He's had no associated headaches or blurry vision. He used to have mostly right-sided complaints and now it is more left-sided. He's had a history of myofascial pain as well. He has had a history of multiple bouts of physical therapy. He is actually undergoing physical therapy soon for his lower back. This will be with Barbaraann Barthel. He has had an updated cervical spine MRI performed last year this is reviewed below. It mainly shows multilevel facet arthropathy and degenerative changes with some foraminal narrowing but not much in the way of central stenosis or disc herniation.    Review of Systems  Constitutional: Negative for chills, fever, malaise/fatigue and  weight loss.  HENT: Negative for hearing loss and sinus pain.   Eyes: Negative for blurred vision, double vision and photophobia.  Respiratory: Negative for cough and shortness of breath.   Cardiovascular: Negative for chest pain, palpitations and leg swelling.  Gastrointestinal: Negative for abdominal pain, nausea and vomiting.  Genitourinary: Negative for flank pain.  Musculoskeletal: Positive for back pain and neck pain. Negative for myalgias.  Skin: Negative for itching and rash.  Neurological: Positive for tingling. Negative for tremors, focal weakness and weakness.  Endo/Heme/Allergies: Negative.   Psychiatric/Behavioral: Negative for depression.  All other systems reviewed and are negative.  Otherwise per HPI.  Assessment & Plan: Visit Diagnoses:  1. Spinal stenosis of cervical region   2. Cervical radiculopathy   3. Chronic left shoulder pain     Plan: Findings:  Chronic worsening severe at times left neck and shoulder pain with some referral down the arm to the elbow. He gets intermittent paresthesias into the hands. He's had no prior cervical surgery but he has had prior cervical injections with good relief in the past. He's had no new trauma. MRI from last year does show multilevel facet arthropathy and foraminal stenosis. I think it would be wise at this point due to the amount of pain that he is having to complete a cervical epidural since he has benefited in the past. He could be getting more pain from the myofascial pain as well. He has had some decrease in activity because of recent lumbar surgery. He did talk  to his surgeon about having an epidural injection of the surgeon said it was okay. He is having his care for his lower back at this point at [facility]. He does ask since he was seeing Dr. Paulla Fore last year Dr. Paulla Fore is no longer in the office who he should see for pain management. I asked him to go ahead and follow up with Dr. Paulla Fore at his new office. For his low back  and right is to continue with the folks at [facility] and I think they would be the best resource for his pain at that point. We'll see a does with the injection and will follow from there. He'll continue his current medications. Injection was completed today due to the amount of pain he was having.    Meds & Orders:  Meds ordered this encounter  Medications  . lidocaine (PF) (XYLOCAINE) 1 % injection 2 mL  . methylPREDNISolone acetate (DEPO-MEDROL) injection 80 mg    Orders Placed This Encounter  Procedures  . XR C-ARM NO REPORT  . Ambulatory referral to Physical Therapy  . Epidural Steroid injection    Follow-up: Return if symptoms worsen or fail to improve.   Procedures: No procedures performed  Cervical Epidural Steroid Injection - Interlaminar Approach with Fluoroscopic Guidance  Patient: Cory Bright      Date of Birth: 1936/08/05 MRN: [id number] PCP: Velna Hatchet, MD      Visit Date: 05/14/2017   Universal Protocol:    Date/Time: 06/06/185:31 AM  Consent Given By: the patient  Position: PRONE  Additional Comments: Vital signs were monitored before and after the procedure. Patient was prepped and draped in the usual sterile fashion. The correct patient, procedure, and site was verified.   Injection Procedure Details:  Procedure Site One Meds Administered:  Meds ordered this encounter  Medications  . lidocaine (PF) (XYLOCAINE) 1 % injection 2 mL  . methylPREDNISolone acetate (DEPO-MEDROL) injection 80 mg     Laterality: Left  Location/Site:  C7-T1  Needle size: 20 G  Needle type: Touhy  Needle Placement: Paramedian epidural space  Findings:  -Contrast Used: 1 mL iohexol 180 mg iodine/mL   -Comments: Excellent flow of contrast into the epidural space.  Procedure Details: Using a paramedian approach from the side mentioned above, the region overlying the inferior lamina was localized under fluoroscopic visualization and the soft tissues  overlying this structure were infiltrated with 4 ml. of 1% Lidocaine without Epinephrine. A # 20 gauge, Tuohy needle was inserted into the epidural space using a paramedian approach.  The epidural space was localized using loss of resistance along with lateral and contralateral oblique bi-planar fluoroscopic views.  After negative aspirate for air, blood, and CSF, a 2 ml. volume of Isovue-250 was injected into the epidural space and the flow of contrast was observed. Radiographs were obtained for documentation purposes.   The injectate was administered into the level noted above.  Additional Comments:  The patient tolerated the procedure well Dressing: Band-Aid    Post-procedure details: Patient was observed during the procedure. Post-procedure instructions were reviewed.  Patient left the clinic in stable condition.     Clinical History: Cervical Spine MRI  03/11/2016   The cervical spinal cord is normal in caliber and signal. Paraspinal soft tissues are unremarkable.  C2-3: Advanced left facet arthrosis results in moderate left neural foraminal stenosis, unchanged. No spinal stenosis.  C3-4: Left greater than right facet arthrosis and mild uncovertebral spurring result in mild to moderate bilateral neural  foraminal stenosis without spinal stenosis, unchanged.  C4-5: Moderate right facet arthrosis and mild right uncovertebral spurring result in increased, moderate right neural foraminal stenosis and borderline spinal stenosis.  C5-6: Mild disc bulging, uncovertebral spurring, and left greater than right facet arthrosis result in mild right and moderate left neural foraminal stenosis, slightly progressed on the left from the prior study. No spinal stenosis.  C6-7: Mild disc bulging, uncovertebral spurring, and mild facet arthrosis result in mild-to-moderate bilateral neural foraminal stenosis, stable to minimally increased from prior. No spinal stenosis.  C7-T1:  Mild disc bulging and mild left facet arthrosis have slightly progressed from prior and result in mild left neural foraminal narrowing without spinal stenosis.  IMPRESSION: 1. Moderate multilevel cervical disc and facet degeneration with mild progression from 2012. 2. Increased, moderate right neural foraminal stenosis at C4-5. 3. Mild-to-moderate neural foraminal stenosis elsewhere as above.  He reports that he has never smoked. He has never used smokeless tobacco. No results for input(s): HGBA1C, LABURIC in the last 8760 hours.  Objective:  VS:  HT:    WT:   BMI:     BP:(!) 142/67  HR:70bpm  TEMP:97.9 F (36.6 C)( )  RESP:99 % Physical Exam  Constitutional: He is oriented to person, place, and time. He appears well-developed and well-nourished. No distress.  HENT:  Head: Normocephalic and atraumatic.  Eyes: Conjunctivae are normal. Pupils are equal, round, and reactive to light.  Neck: Neck supple. No tracheal deviation present.  Cardiovascular: Regular rhythm and intact distal pulses.   Pulmonary/Chest: Effort normal. No respiratory distress.  Musculoskeletal:  Cervical spine shows forward flexion with some increased lower kyphosis. He has limited range of motion with rotation and extension. He has a negative Spurling's test bilaterally. He does have active trigger points in the levator scapula and supraspinatus and trapezius. He has mild shoulder impingement signs bilaterally. He has good upper body strength bilaterally without any deficits bilaterally. He has a negative Hoffmann's test bilaterally.  Lymphadenopathy:    He has no cervical adenopathy.  Neurological: He is alert and oriented to person, place, and time.  Skin: Skin is warm and dry. No rash noted. No erythema.  Psychiatric: He has a normal mood and affect.  Nursing note and vitals reviewed.   Ortho Exam Imaging: Xr C-arm No Report  Result Date: 05/14/2017 Please see Notes or Procedures tab for imaging  impression.   Past Medical/Family/Surgical/Social History: Medications & Allergies reviewed per EMR Patient Active Problem List   Diagnosis Date Noted  . Small bowel obstruction ([place]) 07/22/2014  . SBO (small bowel obstruction) ([place]) 07/22/2014   Past Medical History:  Diagnosis Date  . Anemia   . Arthritis   . Back pain   . Cancer ([place])    colon  . Hypertension    No family history on file. Past Surgical History:  Procedure Laterality Date  . COLON SURGERY     colectomy, R  . COLONOSCOPY WITH PROPOFOL N/A 04/25/2015   Procedure: COLONOSCOPY WITH PROPOFOL;  Surgeon: Garlan Fair, MD;  Location: [facility] ENDOSCOPY;  Service: Endoscopy;  Laterality: N/A;  . ESOPHAGOGASTRODUODENOSCOPY (EGD) WITH PROPOFOL N/A 10/02/2016   Procedure: ESOPHAGOGASTRODUODENOSCOPY (EGD) WITH PROPOFOL;  Surgeon: Garlan Fair, MD;  Location: [facility] ENDOSCOPY;  Service: Endoscopy;  Laterality: N/A;  . EYE SURGERY     bilateral cataracts with lens implants  . SPINE SURGERY     Social History   Occupational History  . Not on file.   Social History Main Topics  .  Smoking status: Never Smoker  . Smokeless tobacco: Never Used  . Alcohol use Yes     Comment: wine daily  . Drug use: No  . Sexual activity: Not on file

## 2017-05-15 NOTE — Procedures (Signed)
Cervical Epidural Steroid Injection - Interlaminar Approach with Fluoroscopic Guidance  Patient: Cory Bright      Date of Birth: [birth year] MRN: [id number] PCP: Velna Hatchet, MD      Visit Date: 05/14/2017   Universal Protocol:    Date/Time: 06/06/185:31 AM  Consent Given By: the patient  Position: PRONE  Additional Comments: Vital signs were monitored before and after the procedure. Patient was prepped and draped in the usual sterile fashion. The correct patient, procedure, and site was verified.   Injection Procedure Details:  Procedure Site One Meds Administered:  Meds ordered this encounter  Medications  . lidocaine (PF) (XYLOCAINE) 1 % injection 2 mL  . methylPREDNISolone acetate (DEPO-MEDROL) injection 80 mg     Laterality: Left  Location/Site:  C7-T1  Needle size: 20 G  Needle type: Touhy  Needle Placement: Paramedian epidural space  Findings:  -Contrast Used: 1 mL iohexol 180 mg iodine/mL   -Comments: Excellent flow of contrast into the epidural space.  Procedure Details: Using a paramedian approach from the side mentioned above, the region overlying the inferior lamina was localized under fluoroscopic visualization and the soft tissues overlying this structure were infiltrated with 4 ml. of 1% Lidocaine without Epinephrine. A # 20 gauge, Tuohy needle was inserted into the epidural space using a paramedian approach.  The epidural space was localized using loss of resistance along with lateral and contralateral oblique bi-planar fluoroscopic views.  After negative aspirate for air, blood, and CSF, a 2 ml. volume of Isovue-250 was injected into the epidural space and the flow of contrast was observed. Radiographs were obtained for documentation purposes.   The injectate was administered into the level noted above.  Additional Comments:  The patient tolerated the procedure well Dressing: Band-Aid    Post-procedure details: Patient was observed  during the procedure. Post-procedure instructions were reviewed.  Patient left the clinic in stable condition.

## 2017-05-23 DIAGNOSIS — M4802 Spinal stenosis, cervical region: Secondary | ICD-10-CM | POA: Diagnosis not present

## 2017-05-23 DIAGNOSIS — M545 Low back pain: Secondary | ICD-10-CM | POA: Diagnosis not present

## 2017-05-28 ENCOUNTER — Other Ambulatory Visit: Payer: Self-pay | Admitting: Physician Assistant

## 2017-05-28 ENCOUNTER — Ambulatory Visit
Admission: RE | Admit: 2017-05-28 | Discharge: 2017-05-28 | Disposition: A | Discharge status: Home / Self Care | Payer: PPO | Source: Ambulatory Visit | Attending: Physician Assistant | Admitting: Physician Assistant

## 2017-05-28 DIAGNOSIS — M47816 Spondylosis without myelopathy or radiculopathy, lumbar region: Secondary | ICD-10-CM | POA: Diagnosis not present

## 2017-05-28 DIAGNOSIS — Z09 Encounter for follow-up examination after completed treatment for conditions other than malignant neoplasm: Secondary | ICD-10-CM

## 2017-05-29 DIAGNOSIS — M4802 Spinal stenosis, cervical region: Secondary | ICD-10-CM | POA: Diagnosis not present

## 2017-05-29 DIAGNOSIS — M545 Low back pain: Secondary | ICD-10-CM | POA: Diagnosis not present

## 2017-06-06 DIAGNOSIS — M545 Low back pain: Secondary | ICD-10-CM | POA: Diagnosis not present

## 2017-06-06 DIAGNOSIS — M4802 Spinal stenosis, cervical region: Secondary | ICD-10-CM | POA: Diagnosis not present

## 2017-06-17 DIAGNOSIS — M4802 Spinal stenosis, cervical region: Secondary | ICD-10-CM | POA: Diagnosis not present

## 2017-06-17 DIAGNOSIS — M545 Low back pain: Secondary | ICD-10-CM | POA: Diagnosis not present

## 2017-06-20 DIAGNOSIS — M542 Cervicalgia: Secondary | ICD-10-CM | POA: Diagnosis not present

## 2017-06-20 DIAGNOSIS — M48062 Spinal stenosis, lumbar region with neurogenic claudication: Secondary | ICD-10-CM | POA: Diagnosis not present

## 2017-06-20 DIAGNOSIS — Z981 Arthrodesis status: Secondary | ICD-10-CM | POA: Diagnosis not present

## 2017-07-03 ENCOUNTER — Other Ambulatory Visit: Payer: Self-pay | Admitting: Neurological Surgery

## 2017-07-03 DIAGNOSIS — M542 Cervicalgia: Secondary | ICD-10-CM

## 2017-07-08 DIAGNOSIS — M4802 Spinal stenosis, cervical region: Secondary | ICD-10-CM | POA: Diagnosis not present

## 2017-07-08 DIAGNOSIS — M545 Low back pain: Secondary | ICD-10-CM | POA: Diagnosis not present

## 2017-07-15 ENCOUNTER — Ambulatory Visit
Admission: RE | Admit: 2017-07-15 | Discharge: 2017-07-15 | Disposition: A | Discharge status: Home / Self Care | Payer: PPO | Source: Ambulatory Visit | Attending: Neurological Surgery | Admitting: Neurological Surgery

## 2017-07-15 DIAGNOSIS — M542 Cervicalgia: Secondary | ICD-10-CM

## 2017-07-15 DIAGNOSIS — M4802 Spinal stenosis, cervical region: Secondary | ICD-10-CM | POA: Diagnosis not present

## 2017-07-16 DIAGNOSIS — M4802 Spinal stenosis, cervical region: Secondary | ICD-10-CM | POA: Diagnosis not present

## 2017-07-16 DIAGNOSIS — M545 Low back pain: Secondary | ICD-10-CM | POA: Diagnosis not present

## 2017-07-23 DIAGNOSIS — M545 Low back pain: Secondary | ICD-10-CM | POA: Diagnosis not present

## 2017-07-23 DIAGNOSIS — M4802 Spinal stenosis, cervical region: Secondary | ICD-10-CM | POA: Diagnosis not present

## 2017-07-30 DIAGNOSIS — M4802 Spinal stenosis, cervical region: Secondary | ICD-10-CM | POA: Diagnosis not present

## 2017-07-30 DIAGNOSIS — M545 Low back pain: Secondary | ICD-10-CM | POA: Diagnosis not present

## 2017-08-05 DIAGNOSIS — M4802 Spinal stenosis, cervical region: Secondary | ICD-10-CM | POA: Diagnosis not present

## 2017-08-05 DIAGNOSIS — M545 Low back pain: Secondary | ICD-10-CM | POA: Diagnosis not present

## 2017-08-08 DIAGNOSIS — M4802 Spinal stenosis, cervical region: Secondary | ICD-10-CM | POA: Diagnosis not present

## 2017-08-08 DIAGNOSIS — M545 Low back pain: Secondary | ICD-10-CM | POA: Diagnosis not present

## 2017-08-15 DIAGNOSIS — M545 Low back pain: Secondary | ICD-10-CM | POA: Diagnosis not present

## 2017-08-15 DIAGNOSIS — M4802 Spinal stenosis, cervical region: Secondary | ICD-10-CM | POA: Diagnosis not present

## 2017-08-26 ENCOUNTER — Other Ambulatory Visit: Payer: Self-pay | Admitting: Physician Assistant

## 2017-08-26 DIAGNOSIS — M542 Cervicalgia: Secondary | ICD-10-CM

## 2017-08-26 DIAGNOSIS — M545 Low back pain: Secondary | ICD-10-CM | POA: Diagnosis not present

## 2017-08-26 DIAGNOSIS — M4802 Spinal stenosis, cervical region: Secondary | ICD-10-CM | POA: Diagnosis not present

## 2017-09-09 ENCOUNTER — Ambulatory Visit
Admission: RE | Admit: 2017-09-09 | Discharge: 2017-09-09 | Disposition: A | Discharge status: Home / Self Care | Payer: PPO | Source: Ambulatory Visit | Attending: Physician Assistant | Admitting: Physician Assistant

## 2017-09-09 DIAGNOSIS — M542 Cervicalgia: Secondary | ICD-10-CM

## 2017-09-09 DIAGNOSIS — M4802 Spinal stenosis, cervical region: Secondary | ICD-10-CM | POA: Diagnosis not present

## 2017-09-09 DIAGNOSIS — M545 Low back pain: Secondary | ICD-10-CM | POA: Diagnosis not present

## 2017-09-30 DIAGNOSIS — M545 Low back pain: Secondary | ICD-10-CM | POA: Diagnosis not present

## 2017-09-30 DIAGNOSIS — M4802 Spinal stenosis, cervical region: Secondary | ICD-10-CM | POA: Diagnosis not present

## 2017-10-02 DIAGNOSIS — L853 Xerosis cutis: Secondary | ICD-10-CM | POA: Diagnosis not present

## 2017-10-02 DIAGNOSIS — D1801 Hemangioma of skin and subcutaneous tissue: Secondary | ICD-10-CM | POA: Diagnosis not present

## 2017-10-02 DIAGNOSIS — B356 Tinea cruris: Secondary | ICD-10-CM | POA: Diagnosis not present

## 2017-10-02 DIAGNOSIS — D229 Melanocytic nevi, unspecified: Secondary | ICD-10-CM | POA: Diagnosis not present

## 2017-10-02 DIAGNOSIS — L821 Other seborrheic keratosis: Secondary | ICD-10-CM | POA: Diagnosis not present

## 2017-10-09 DIAGNOSIS — M545 Low back pain: Secondary | ICD-10-CM | POA: Diagnosis not present

## 2017-10-09 DIAGNOSIS — M4802 Spinal stenosis, cervical region: Secondary | ICD-10-CM | POA: Diagnosis not present

## 2017-10-22 DIAGNOSIS — M4802 Spinal stenosis, cervical region: Secondary | ICD-10-CM | POA: Diagnosis not present

## 2017-10-22 DIAGNOSIS — M545 Low back pain: Secondary | ICD-10-CM | POA: Diagnosis not present

## 2017-10-29 DIAGNOSIS — M545 Low back pain: Secondary | ICD-10-CM | POA: Diagnosis not present

## 2017-10-29 DIAGNOSIS — M4802 Spinal stenosis, cervical region: Secondary | ICD-10-CM | POA: Diagnosis not present

## 2017-11-11 DIAGNOSIS — M4802 Spinal stenosis, cervical region: Secondary | ICD-10-CM | POA: Diagnosis not present

## 2017-11-11 DIAGNOSIS — M545 Low back pain: Secondary | ICD-10-CM | POA: Diagnosis not present

## 2017-11-19 DIAGNOSIS — M545 Low back pain: Secondary | ICD-10-CM | POA: Diagnosis not present

## 2017-11-19 DIAGNOSIS — M4802 Spinal stenosis, cervical region: Secondary | ICD-10-CM | POA: Diagnosis not present

## 2017-11-25 DIAGNOSIS — M545 Low back pain: Secondary | ICD-10-CM | POA: Diagnosis not present

## 2017-11-25 DIAGNOSIS — M4802 Spinal stenosis, cervical region: Secondary | ICD-10-CM | POA: Diagnosis not present

## 2017-12-30 DIAGNOSIS — M545 Low back pain: Secondary | ICD-10-CM | POA: Diagnosis not present

## 2017-12-30 DIAGNOSIS — M4802 Spinal stenosis, cervical region: Secondary | ICD-10-CM | POA: Diagnosis not present

## 2018-01-06 DIAGNOSIS — M4802 Spinal stenosis, cervical region: Secondary | ICD-10-CM | POA: Diagnosis not present

## 2018-01-06 DIAGNOSIS — M545 Low back pain: Secondary | ICD-10-CM | POA: Diagnosis not present

## 2018-01-22 DIAGNOSIS — I10 Essential (primary) hypertension: Secondary | ICD-10-CM | POA: Diagnosis not present

## 2018-01-22 DIAGNOSIS — Z125 Encounter for screening for malignant neoplasm of prostate: Secondary | ICD-10-CM | POA: Diagnosis not present

## 2018-01-22 DIAGNOSIS — R82998 Other abnormal findings in urine: Secondary | ICD-10-CM | POA: Diagnosis not present

## 2018-01-22 DIAGNOSIS — E538 Deficiency of other specified B group vitamins: Secondary | ICD-10-CM | POA: Diagnosis not present

## 2018-01-22 DIAGNOSIS — E7849 Other hyperlipidemia: Secondary | ICD-10-CM | POA: Diagnosis not present

## 2018-01-22 DIAGNOSIS — R530 Neoplastic (malignant) related fatigue: Secondary | ICD-10-CM | POA: Diagnosis not present

## 2018-01-29 DIAGNOSIS — Z0000 Encounter for general adult medical examination without abnormal findings: Secondary | ICD-10-CM | POA: Diagnosis not present

## 2018-01-29 DIAGNOSIS — Z1389 Encounter for screening for other disorder: Secondary | ICD-10-CM | POA: Diagnosis not present

## 2018-01-29 DIAGNOSIS — Z23 Encounter for immunization: Secondary | ICD-10-CM | POA: Diagnosis not present

## 2018-01-29 DIAGNOSIS — L500 Allergic urticaria: Secondary | ICD-10-CM | POA: Diagnosis not present

## 2018-01-29 DIAGNOSIS — E538 Deficiency of other specified B group vitamins: Secondary | ICD-10-CM | POA: Diagnosis not present

## 2018-01-29 DIAGNOSIS — Z6822 Body mass index (BMI) 22.0-22.9, adult: Secondary | ICD-10-CM | POA: Diagnosis not present

## 2018-01-29 DIAGNOSIS — E7849 Other hyperlipidemia: Secondary | ICD-10-CM | POA: Diagnosis not present

## 2018-01-29 DIAGNOSIS — N401 Enlarged prostate with lower urinary tract symptoms: Secondary | ICD-10-CM | POA: Diagnosis not present

## 2018-01-29 DIAGNOSIS — R42 Dizziness and giddiness: Secondary | ICD-10-CM | POA: Diagnosis not present

## 2018-01-29 DIAGNOSIS — I10 Essential (primary) hypertension: Secondary | ICD-10-CM | POA: Diagnosis not present

## 2018-01-29 DIAGNOSIS — C189 Malignant neoplasm of colon, unspecified: Secondary | ICD-10-CM | POA: Diagnosis not present

## 2018-01-29 DIAGNOSIS — D649 Anemia, unspecified: Secondary | ICD-10-CM | POA: Diagnosis not present

## 2018-01-30 DIAGNOSIS — Z1212 Encounter for screening for malignant neoplasm of rectum: Secondary | ICD-10-CM | POA: Diagnosis not present

## 2018-03-03 DIAGNOSIS — R195 Other fecal abnormalities: Secondary | ICD-10-CM | POA: Diagnosis not present

## 2018-03-03 DIAGNOSIS — K219 Gastro-esophageal reflux disease without esophagitis: Secondary | ICD-10-CM | POA: Diagnosis not present

## 2018-03-03 DIAGNOSIS — K625 Hemorrhage of anus and rectum: Secondary | ICD-10-CM | POA: Diagnosis not present

## 2018-03-03 DIAGNOSIS — Z85038 Personal history of other malignant neoplasm of large intestine: Secondary | ICD-10-CM | POA: Diagnosis not present

## 2018-03-03 DIAGNOSIS — Z8719 Personal history of other diseases of the digestive system: Secondary | ICD-10-CM | POA: Diagnosis not present

## 2018-03-28 DIAGNOSIS — H527 Unspecified disorder of refraction: Secondary | ICD-10-CM | POA: Diagnosis not present

## 2018-03-28 DIAGNOSIS — Z961 Presence of intraocular lens: Secondary | ICD-10-CM | POA: Diagnosis not present

## 2018-03-28 DIAGNOSIS — B300 Keratoconjunctivitis due to adenovirus: Secondary | ICD-10-CM | POA: Diagnosis not present

## 2018-04-07 DIAGNOSIS — Z8619 Personal history of other infectious and parasitic diseases: Secondary | ICD-10-CM | POA: Diagnosis not present

## 2018-04-07 DIAGNOSIS — Z09 Encounter for follow-up examination after completed treatment for conditions other than malignant neoplasm: Secondary | ICD-10-CM | POA: Diagnosis not present

## 2018-04-07 DIAGNOSIS — H527 Unspecified disorder of refraction: Secondary | ICD-10-CM | POA: Diagnosis not present

## 2018-04-07 DIAGNOSIS — Z961 Presence of intraocular lens: Secondary | ICD-10-CM | POA: Diagnosis not present

## 2018-04-10 DIAGNOSIS — L309 Dermatitis, unspecified: Secondary | ICD-10-CM | POA: Diagnosis not present

## 2018-04-10 DIAGNOSIS — L304 Erythema intertrigo: Secondary | ICD-10-CM | POA: Diagnosis not present

## 2018-04-24 DIAGNOSIS — H52203 Unspecified astigmatism, bilateral: Secondary | ICD-10-CM | POA: Diagnosis not present

## 2018-04-24 DIAGNOSIS — Z961 Presence of intraocular lens: Secondary | ICD-10-CM | POA: Diagnosis not present

## 2018-04-24 DIAGNOSIS — H02401 Unspecified ptosis of right eyelid: Secondary | ICD-10-CM | POA: Diagnosis not present

## 2018-04-24 DIAGNOSIS — H5213 Myopia, bilateral: Secondary | ICD-10-CM | POA: Diagnosis not present

## 2018-04-24 DIAGNOSIS — H04129 Dry eye syndrome of unspecified lacrimal gland: Secondary | ICD-10-CM | POA: Diagnosis not present

## 2018-04-24 DIAGNOSIS — H02833 Dermatochalasis of right eye, unspecified eyelid: Secondary | ICD-10-CM | POA: Diagnosis not present

## 2018-04-24 DIAGNOSIS — H02836 Dermatochalasis of left eye, unspecified eyelid: Secondary | ICD-10-CM | POA: Diagnosis not present

## 2018-04-28 DIAGNOSIS — R195 Other fecal abnormalities: Secondary | ICD-10-CM | POA: Diagnosis not present

## 2018-04-28 DIAGNOSIS — Z86010 Personal history of colonic polyps: Secondary | ICD-10-CM | POA: Diagnosis not present

## 2018-04-28 DIAGNOSIS — Z980 Intestinal bypass and anastomosis status: Secondary | ICD-10-CM | POA: Diagnosis not present

## 2018-04-28 DIAGNOSIS — K621 Rectal polyp: Secondary | ICD-10-CM | POA: Diagnosis not present

## 2018-04-28 DIAGNOSIS — Z85038 Personal history of other malignant neoplasm of large intestine: Secondary | ICD-10-CM | POA: Diagnosis not present

## 2018-04-30 DIAGNOSIS — K621 Rectal polyp: Secondary | ICD-10-CM | POA: Diagnosis not present

## 2018-05-07 DIAGNOSIS — H02403 Unspecified ptosis of bilateral eyelids: Secondary | ICD-10-CM | POA: Diagnosis not present

## 2018-05-07 DIAGNOSIS — Z961 Presence of intraocular lens: Secondary | ICD-10-CM | POA: Diagnosis not present

## 2018-05-07 DIAGNOSIS — Z9841 Cataract extraction status, right eye: Secondary | ICD-10-CM | POA: Diagnosis not present

## 2018-05-07 DIAGNOSIS — Z9842 Cataract extraction status, left eye: Secondary | ICD-10-CM | POA: Diagnosis not present

## 2018-05-14 DIAGNOSIS — Z961 Presence of intraocular lens: Secondary | ICD-10-CM | POA: Diagnosis not present

## 2018-05-14 DIAGNOSIS — H02403 Unspecified ptosis of bilateral eyelids: Secondary | ICD-10-CM | POA: Diagnosis not present

## 2018-05-14 DIAGNOSIS — Z9842 Cataract extraction status, left eye: Secondary | ICD-10-CM | POA: Diagnosis not present

## 2018-05-14 DIAGNOSIS — Z9841 Cataract extraction status, right eye: Secondary | ICD-10-CM | POA: Diagnosis not present

## 2018-05-20 IMAGING — RF DG ESOPHAGUS
10 of 12 series · 19 of 24 positions shown · non-contrast
Comparison: None.

CLINICAL DATA: Dysphagia

EXAM:
ESOPHOGRAM/BARIUM SWALLOW
TECHNIQUE: Single contrast examination was performed using  thin barium.
FLUOROSCOPY TIME:  Fluoroscopy Time:  1 minutes 54 seconds
Radiation Exposure Index (if provided by the fluoroscopic device):
69 deciGy per square cm
Number of Acquired Spot Images: 0

[Series 1: run · 2 of 8 slices shown (1 of 10)]
[im 1/8]
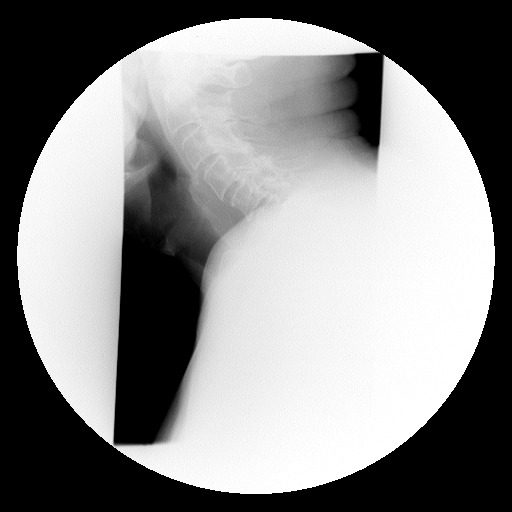
[im 4/8]
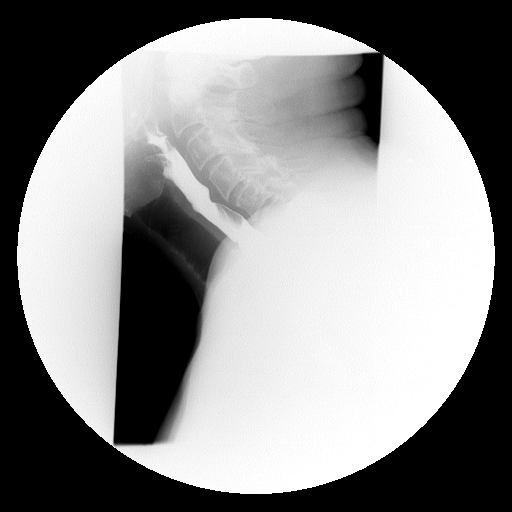

[Series 2: run · 3 of 8 slices shown (2 of 10)]
[im 1/8]
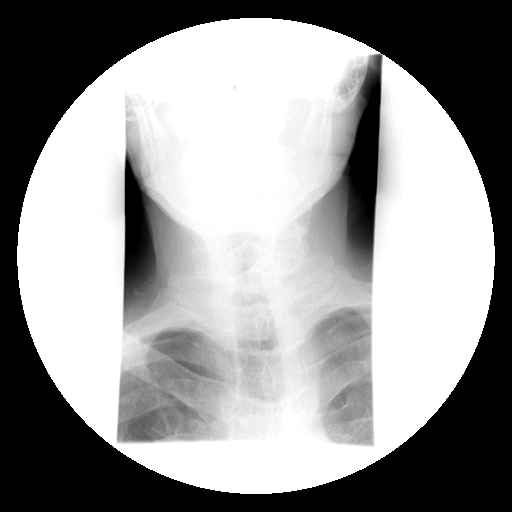
[im 4/8]
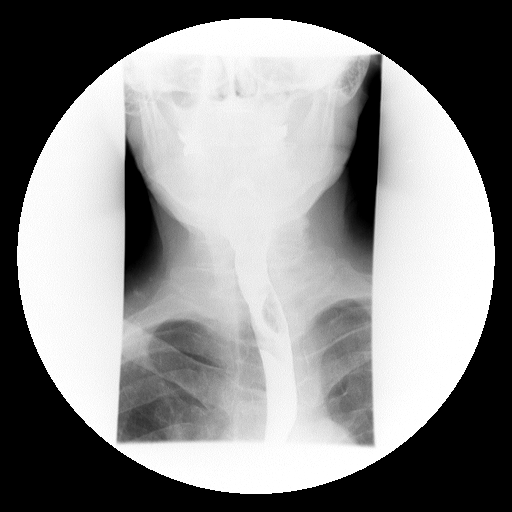
[im 8/8]
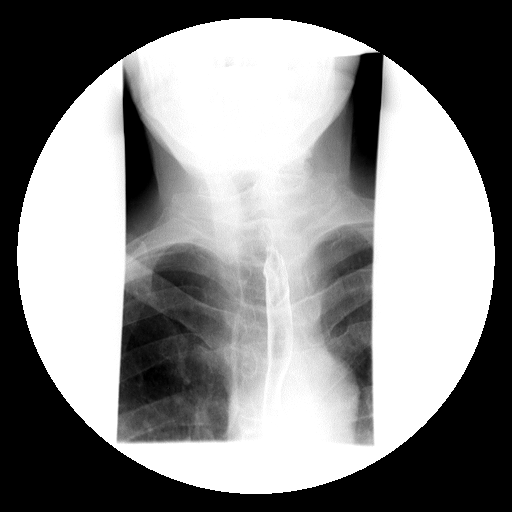

[Series 3: run · 7 of 18 slices shown (3 of 10)]
[im 1/18]
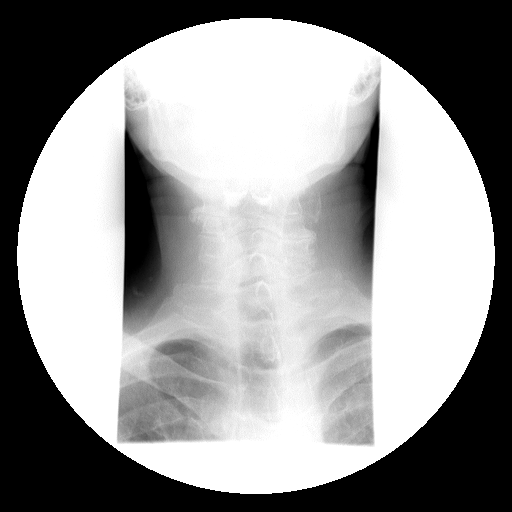
[im 5/18]
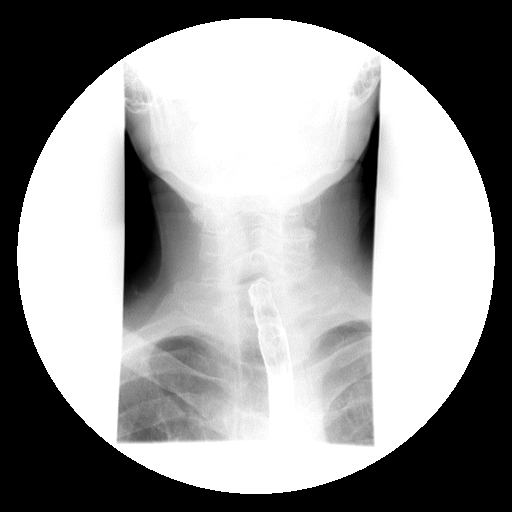
[im 7/18]
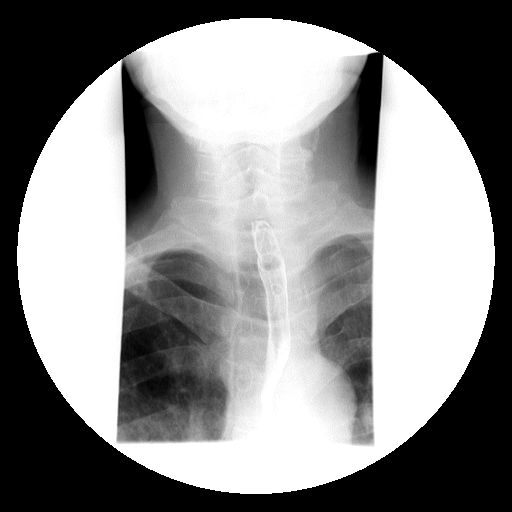
[im 9/18]
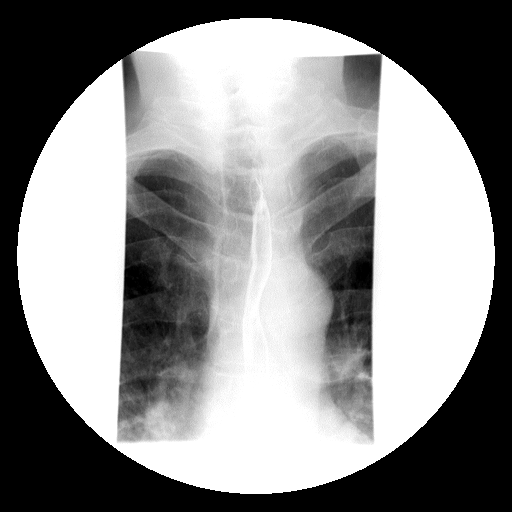
[im 13/18]
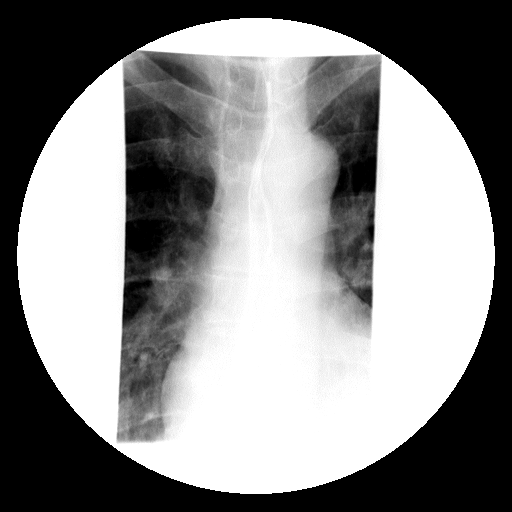
[im 15/18]
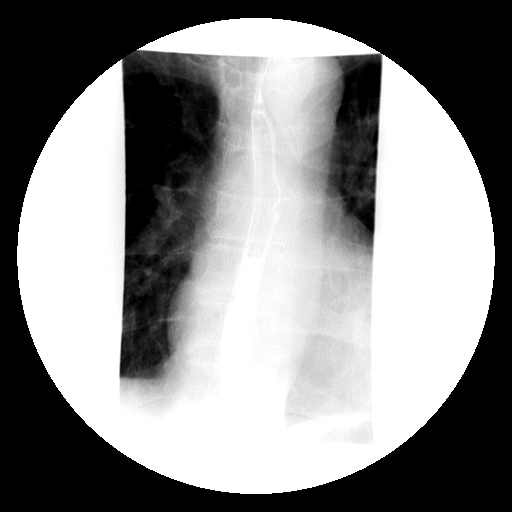
[im 18/18]
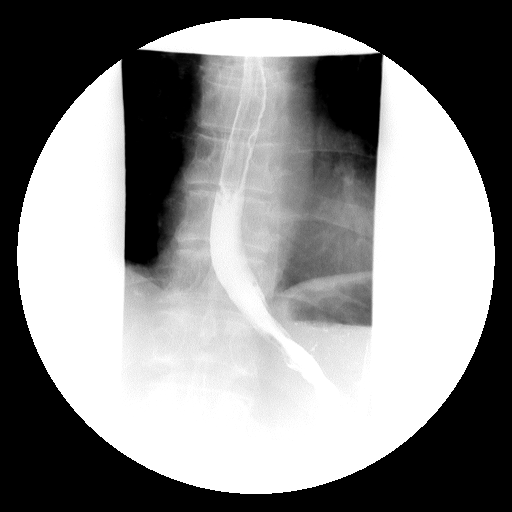

[Series 4: run · 1 of 1 slices shown (4 of 10)]
[im 1/1]
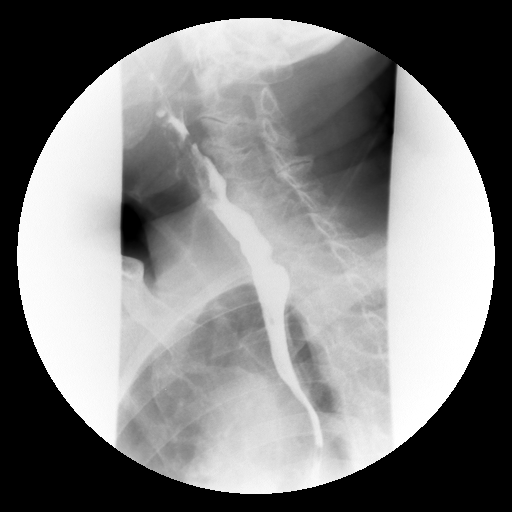

[Series 7: run · 1 of 1 slices shown (5 of 10)]
[im 1/1]
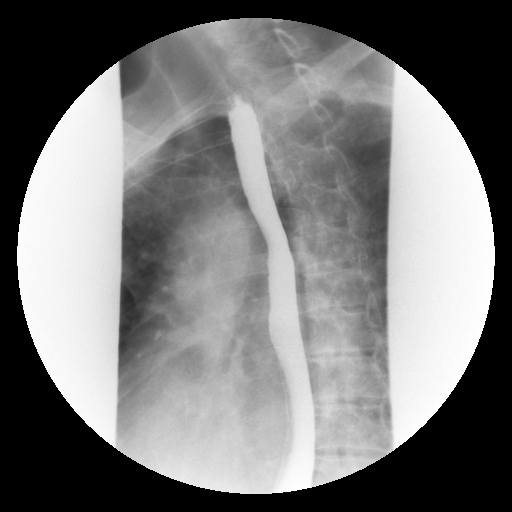

[Series 8: run · 1 of 1 slices shown (6 of 10)]
[im 1/1]
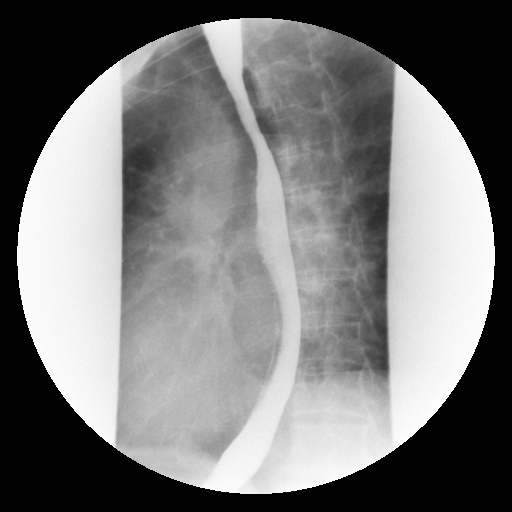

[Series 9: run · 1 of 1 slices shown (7 of 10)]
[im 1/1]
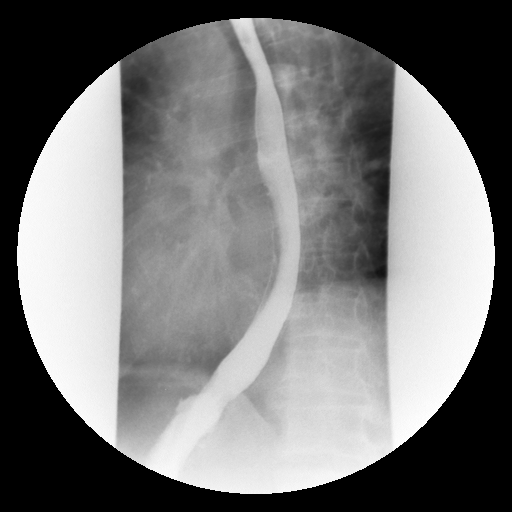

[Series 10: run · 1 of 1 slices shown (8 of 10)]
[im 1/1]
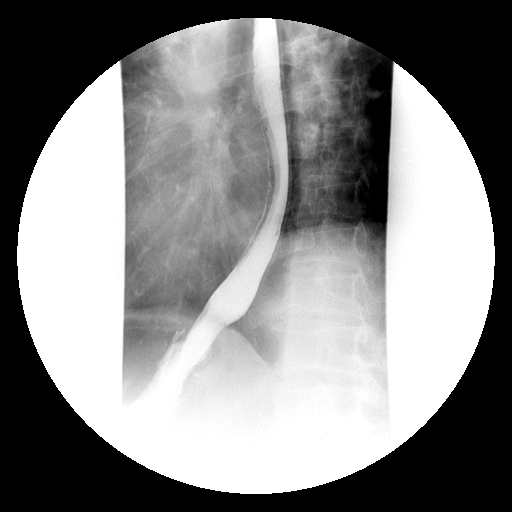

[Series 12: run · 1 of 1 slices shown (9 of 10)]
[im 1/1]
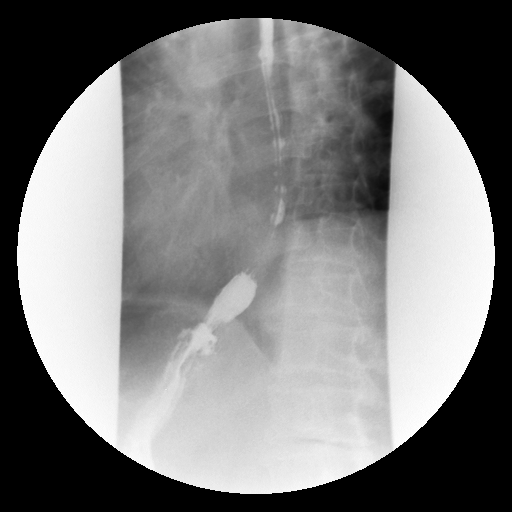

[Series 13: run · 1 of 1 slices shown (10 of 10)]
[im 1/1]
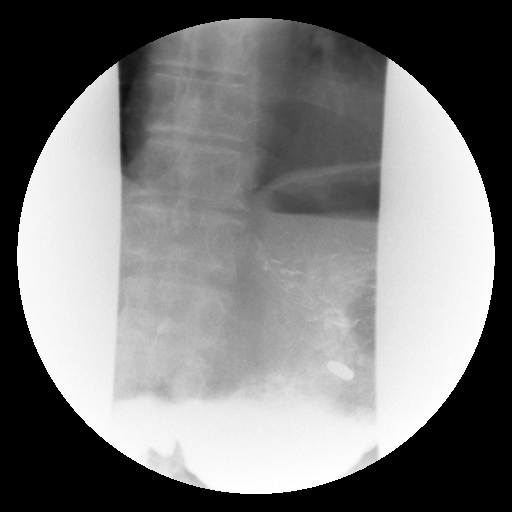

[19 of 24 positions shown; findings below may reference images not displayed]

FINDINGS: Initially the study was begun in the erect lateral projection. No
aspiration or penetration is seen. There is a somewhat prominent
cricopharyngeus muscle noted. Only mild tertiary contractions are
present in the distal esophagus. No hiatal hernia is seen. No
gastroesophageal reflux could be demonstrated with the water siphon
maneuver. A barium pill was given at the end of the study which
passed into the stomach without delay.
IMPRESSION: 1. Prominent cricopharyngeus muscle.
2. No definite gastroesophageal reflux could be demonstrated. Barium
pill passed into the stomach without delay.
3. Mild tertiary contractions distally.

## 2018-06-18 DIAGNOSIS — H02403 Unspecified ptosis of bilateral eyelids: Secondary | ICD-10-CM | POA: Diagnosis not present

## 2018-06-18 DIAGNOSIS — Z9841 Cataract extraction status, right eye: Secondary | ICD-10-CM | POA: Diagnosis not present

## 2018-06-18 DIAGNOSIS — Z961 Presence of intraocular lens: Secondary | ICD-10-CM | POA: Diagnosis not present

## 2018-06-18 DIAGNOSIS — Z9842 Cataract extraction status, left eye: Secondary | ICD-10-CM | POA: Diagnosis not present

## 2018-08-06 DIAGNOSIS — L309 Dermatitis, unspecified: Secondary | ICD-10-CM | POA: Diagnosis not present

## 2018-10-22 DIAGNOSIS — Z9842 Cataract extraction status, left eye: Secondary | ICD-10-CM | POA: Diagnosis not present

## 2018-10-22 DIAGNOSIS — Z9841 Cataract extraction status, right eye: Secondary | ICD-10-CM | POA: Diagnosis not present

## 2018-10-22 DIAGNOSIS — Z961 Presence of intraocular lens: Secondary | ICD-10-CM | POA: Diagnosis not present

## 2019-01-28 DIAGNOSIS — E7849 Other hyperlipidemia: Secondary | ICD-10-CM | POA: Diagnosis not present

## 2019-01-28 DIAGNOSIS — I10 Essential (primary) hypertension: Secondary | ICD-10-CM | POA: Diagnosis not present

## 2019-01-28 DIAGNOSIS — E538 Deficiency of other specified B group vitamins: Secondary | ICD-10-CM | POA: Diagnosis not present

## 2019-01-28 DIAGNOSIS — Z125 Encounter for screening for malignant neoplasm of prostate: Secondary | ICD-10-CM | POA: Diagnosis not present

## 2019-01-28 DIAGNOSIS — R82998 Other abnormal findings in urine: Secondary | ICD-10-CM | POA: Diagnosis not present

## 2019-02-02 DIAGNOSIS — Z1331 Encounter for screening for depression: Secondary | ICD-10-CM | POA: Diagnosis not present

## 2019-02-02 DIAGNOSIS — I10 Essential (primary) hypertension: Secondary | ICD-10-CM | POA: Diagnosis not present

## 2019-02-02 DIAGNOSIS — C189 Malignant neoplasm of colon, unspecified: Secondary | ICD-10-CM | POA: Diagnosis not present

## 2019-02-02 DIAGNOSIS — Z6822 Body mass index (BMI) 22.0-22.9, adult: Secondary | ICD-10-CM | POA: Diagnosis not present

## 2019-02-02 DIAGNOSIS — E7849 Other hyperlipidemia: Secondary | ICD-10-CM | POA: Diagnosis not present

## 2019-02-02 DIAGNOSIS — Z1339 Encounter for screening examination for other mental health and behavioral disorders: Secondary | ICD-10-CM | POA: Diagnosis not present

## 2019-02-02 DIAGNOSIS — D6489 Other specified anemias: Secondary | ICD-10-CM | POA: Diagnosis not present

## 2019-02-02 DIAGNOSIS — E538 Deficiency of other specified B group vitamins: Secondary | ICD-10-CM | POA: Diagnosis not present

## 2019-02-02 DIAGNOSIS — Z0000 Encounter for general adult medical examination without abnormal findings: Secondary | ICD-10-CM | POA: Diagnosis not present

## 2019-02-02 DIAGNOSIS — N401 Enlarged prostate with lower urinary tract symptoms: Secondary | ICD-10-CM | POA: Diagnosis not present

## 2019-02-03 DIAGNOSIS — Z1212 Encounter for screening for malignant neoplasm of rectum: Secondary | ICD-10-CM | POA: Diagnosis not present

## 2019-02-25 DIAGNOSIS — D1801 Hemangioma of skin and subcutaneous tissue: Secondary | ICD-10-CM | POA: Diagnosis not present

## 2019-02-25 DIAGNOSIS — L819 Disorder of pigmentation, unspecified: Secondary | ICD-10-CM | POA: Diagnosis not present

## 2019-02-25 DIAGNOSIS — L821 Other seborrheic keratosis: Secondary | ICD-10-CM | POA: Diagnosis not present

## 2019-02-25 DIAGNOSIS — D229 Melanocytic nevi, unspecified: Secondary | ICD-10-CM | POA: Diagnosis not present

## 2019-02-25 DIAGNOSIS — L304 Erythema intertrigo: Secondary | ICD-10-CM | POA: Diagnosis not present

## 2019-02-25 DIAGNOSIS — L570 Actinic keratosis: Secondary | ICD-10-CM | POA: Diagnosis not present

## 2019-03-26 DIAGNOSIS — Z85038 Personal history of other malignant neoplasm of large intestine: Secondary | ICD-10-CM | POA: Diagnosis not present

## 2019-03-26 DIAGNOSIS — R195 Other fecal abnormalities: Secondary | ICD-10-CM | POA: Diagnosis not present

## 2019-03-26 DIAGNOSIS — K219 Gastro-esophageal reflux disease without esophagitis: Secondary | ICD-10-CM | POA: Diagnosis not present

## 2019-03-26 DIAGNOSIS — Z8719 Personal history of other diseases of the digestive system: Secondary | ICD-10-CM | POA: Diagnosis not present

## 2019-03-30 ENCOUNTER — Other Ambulatory Visit: Payer: Self-pay | Admitting: Gastroenterology

## 2019-03-30 ENCOUNTER — Ambulatory Visit
Admission: RE | Admit: 2019-03-30 | Discharge: 2019-03-30 | Disposition: A | Discharge status: Home / Self Care | Payer: PPO | Source: Ambulatory Visit | Attending: Gastroenterology | Admitting: Gastroenterology

## 2019-03-30 DIAGNOSIS — R1012 Left upper quadrant pain: Secondary | ICD-10-CM

## 2019-03-30 DIAGNOSIS — R109 Unspecified abdominal pain: Secondary | ICD-10-CM | POA: Diagnosis not present

## 2019-04-27 DIAGNOSIS — I10 Essential (primary) hypertension: Secondary | ICD-10-CM | POA: Diagnosis not present

## 2019-04-27 DIAGNOSIS — C189 Malignant neoplasm of colon, unspecified: Secondary | ICD-10-CM | POA: Diagnosis not present

## 2019-04-27 DIAGNOSIS — R591 Generalized enlarged lymph nodes: Secondary | ICD-10-CM | POA: Diagnosis not present

## 2019-05-06 ENCOUNTER — Other Ambulatory Visit: Payer: Self-pay | Admitting: Internal Medicine

## 2019-05-06 ENCOUNTER — Ambulatory Visit
Admission: RE | Admit: 2019-05-06 | Discharge: 2019-05-06 | Disposition: A | Discharge status: Home / Self Care | Payer: PPO | Source: Ambulatory Visit | Attending: Internal Medicine | Admitting: Internal Medicine

## 2019-05-06 ENCOUNTER — Other Ambulatory Visit: Payer: Self-pay

## 2019-05-06 DIAGNOSIS — C189 Malignant neoplasm of colon, unspecified: Secondary | ICD-10-CM | POA: Diagnosis not present

## 2019-05-06 DIAGNOSIS — R591 Generalized enlarged lymph nodes: Secondary | ICD-10-CM | POA: Diagnosis not present

## 2019-05-06 MED ORDER — IOPAMIDOL (ISOVUE-300) INJECTION 61%
75.0000 mL | Freq: Once | INTRAVENOUS | Status: AC | PRN
  Administered 2019-05-06: 75 mL via INTRAVENOUS

## 2019-05-07 ENCOUNTER — Other Ambulatory Visit: Payer: Self-pay | Admitting: Internal Medicine

## 2019-05-07 DIAGNOSIS — I6523 Occlusion and stenosis of bilateral carotid arteries: Secondary | ICD-10-CM

## 2019-05-11 DIAGNOSIS — K1120 Sialoadenitis, unspecified: Secondary | ICD-10-CM | POA: Diagnosis not present

## 2019-05-11 DIAGNOSIS — R221 Localized swelling, mass and lump, neck: Secondary | ICD-10-CM | POA: Diagnosis not present

## 2019-05-12 ENCOUNTER — Ambulatory Visit
Admission: RE | Admit: 2019-05-12 | Discharge: 2019-05-12 | Disposition: A | Discharge status: Home / Self Care | Payer: PPO | Source: Ambulatory Visit | Attending: Internal Medicine | Admitting: Internal Medicine

## 2019-05-12 DIAGNOSIS — I6522 Occlusion and stenosis of left carotid artery: Secondary | ICD-10-CM | POA: Diagnosis not present

## 2019-05-12 DIAGNOSIS — I6523 Occlusion and stenosis of bilateral carotid arteries: Secondary | ICD-10-CM

## 2019-05-13 ENCOUNTER — Other Ambulatory Visit: Payer: Self-pay | Admitting: Internal Medicine

## 2019-05-13 DIAGNOSIS — I6523 Occlusion and stenosis of bilateral carotid arteries: Secondary | ICD-10-CM

## 2019-05-14 DIAGNOSIS — I6523 Occlusion and stenosis of bilateral carotid arteries: Secondary | ICD-10-CM | POA: Diagnosis not present

## 2019-05-20 ENCOUNTER — Other Ambulatory Visit (HOSPITAL_COMMUNITY): Payer: Self-pay | Admitting: Internal Medicine

## 2019-05-20 DIAGNOSIS — R591 Generalized enlarged lymph nodes: Secondary | ICD-10-CM

## 2019-05-28 ENCOUNTER — Inpatient Hospital Stay: Admission: RE | Admit: 2019-05-28 | Payer: PPO | Source: Ambulatory Visit

## 2019-06-02 DIAGNOSIS — R590 Localized enlarged lymph nodes: Secondary | ICD-10-CM | POA: Diagnosis not present

## 2019-06-22 ENCOUNTER — Ambulatory Visit
Admission: RE | Admit: 2019-06-22 | Discharge: 2019-06-22 | Disposition: A | Discharge status: Home / Self Care | Payer: PPO | Source: Ambulatory Visit | Attending: Otolaryngology | Admitting: Otolaryngology

## 2019-06-22 ENCOUNTER — Other Ambulatory Visit: Payer: Self-pay | Admitting: Otolaryngology

## 2019-06-22 DIAGNOSIS — K111 Hypertrophy of salivary gland: Secondary | ICD-10-CM

## 2019-06-22 DIAGNOSIS — R590 Localized enlarged lymph nodes: Secondary | ICD-10-CM | POA: Diagnosis not present

## 2019-06-22 DIAGNOSIS — K1120 Sialoadenitis, unspecified: Secondary | ICD-10-CM | POA: Diagnosis not present

## 2019-06-22 DIAGNOSIS — R682 Dry mouth, unspecified: Secondary | ICD-10-CM | POA: Diagnosis not present

## 2019-06-22 DIAGNOSIS — H04129 Dry eye syndrome of unspecified lacrimal gland: Secondary | ICD-10-CM | POA: Diagnosis not present

## 2019-06-22 DIAGNOSIS — Z85038 Personal history of other malignant neoplasm of large intestine: Secondary | ICD-10-CM | POA: Diagnosis not present

## 2019-07-14 DIAGNOSIS — R682 Dry mouth, unspecified: Secondary | ICD-10-CM | POA: Diagnosis not present

## 2019-07-14 DIAGNOSIS — K111 Hypertrophy of salivary gland: Secondary | ICD-10-CM | POA: Diagnosis not present

## 2019-08-27 DIAGNOSIS — L304 Erythema intertrigo: Secondary | ICD-10-CM | POA: Diagnosis not present

## 2019-08-27 DIAGNOSIS — L819 Disorder of pigmentation, unspecified: Secondary | ICD-10-CM | POA: Diagnosis not present

## 2019-09-10 DIAGNOSIS — K5900 Constipation, unspecified: Secondary | ICD-10-CM | POA: Diagnosis not present

## 2019-09-10 DIAGNOSIS — N401 Enlarged prostate with lower urinary tract symptoms: Secondary | ICD-10-CM | POA: Diagnosis not present

## 2019-09-10 DIAGNOSIS — R338 Other retention of urine: Secondary | ICD-10-CM | POA: Diagnosis not present

## 2019-09-17 DIAGNOSIS — R338 Other retention of urine: Secondary | ICD-10-CM | POA: Diagnosis not present

## 2019-09-17 DIAGNOSIS — N401 Enlarged prostate with lower urinary tract symptoms: Secondary | ICD-10-CM | POA: Diagnosis not present

## 2019-09-21 DIAGNOSIS — Z85038 Personal history of other malignant neoplasm of large intestine: Secondary | ICD-10-CM | POA: Diagnosis not present

## 2019-09-21 DIAGNOSIS — Z9049 Acquired absence of other specified parts of digestive tract: Secondary | ICD-10-CM | POA: Diagnosis not present

## 2019-09-21 DIAGNOSIS — R197 Diarrhea, unspecified: Secondary | ICD-10-CM | POA: Diagnosis not present

## 2019-09-21 DIAGNOSIS — Z8719 Personal history of other diseases of the digestive system: Secondary | ICD-10-CM | POA: Diagnosis not present

## 2019-09-21 DIAGNOSIS — R1033 Periumbilical pain: Secondary | ICD-10-CM | POA: Diagnosis not present

## 2019-09-24 DIAGNOSIS — N281 Cyst of kidney, acquired: Secondary | ICD-10-CM | POA: Diagnosis not present

## 2019-09-24 DIAGNOSIS — N138 Other obstructive and reflux uropathy: Secondary | ICD-10-CM | POA: Diagnosis not present

## 2019-09-24 DIAGNOSIS — R339 Retention of urine, unspecified: Secondary | ICD-10-CM | POA: Diagnosis not present

## 2019-09-24 DIAGNOSIS — N401 Enlarged prostate with lower urinary tract symptoms: Secondary | ICD-10-CM | POA: Diagnosis not present

## 2019-09-25 DIAGNOSIS — R339 Retention of urine, unspecified: Secondary | ICD-10-CM | POA: Diagnosis not present

## 2019-09-29 DIAGNOSIS — Z20828 Contact with and (suspected) exposure to other viral communicable diseases: Secondary | ICD-10-CM | POA: Diagnosis not present

## 2019-09-29 DIAGNOSIS — C61 Malignant neoplasm of prostate: Secondary | ICD-10-CM | POA: Diagnosis not present

## 2019-09-29 DIAGNOSIS — Z01812 Encounter for preprocedural laboratory examination: Secondary | ICD-10-CM | POA: Diagnosis not present

## 2019-10-05 DIAGNOSIS — I440 Atrioventricular block, first degree: Secondary | ICD-10-CM | POA: Diagnosis not present

## 2019-10-05 DIAGNOSIS — Z85038 Personal history of other malignant neoplasm of large intestine: Secondary | ICD-10-CM | POA: Diagnosis not present

## 2019-10-05 DIAGNOSIS — N401 Enlarged prostate with lower urinary tract symptoms: Secondary | ICD-10-CM | POA: Diagnosis not present

## 2019-10-05 DIAGNOSIS — K219 Gastro-esophageal reflux disease without esophagitis: Secondary | ICD-10-CM | POA: Diagnosis not present

## 2019-10-05 DIAGNOSIS — R001 Bradycardia, unspecified: Secondary | ICD-10-CM | POA: Diagnosis not present

## 2019-10-05 DIAGNOSIS — R338 Other retention of urine: Secondary | ICD-10-CM | POA: Diagnosis not present

## 2019-10-05 DIAGNOSIS — E785 Hyperlipidemia, unspecified: Secondary | ICD-10-CM | POA: Diagnosis not present

## 2019-10-05 DIAGNOSIS — I10 Essential (primary) hypertension: Secondary | ICD-10-CM | POA: Diagnosis not present

## 2019-10-06 DIAGNOSIS — R351 Nocturia: Secondary | ICD-10-CM | POA: Diagnosis not present

## 2019-10-06 DIAGNOSIS — N401 Enlarged prostate with lower urinary tract symptoms: Secondary | ICD-10-CM | POA: Diagnosis not present

## 2019-10-06 DIAGNOSIS — R338 Other retention of urine: Secondary | ICD-10-CM | POA: Diagnosis not present

## 2019-10-12 DIAGNOSIS — R338 Other retention of urine: Secondary | ICD-10-CM | POA: Diagnosis not present

## 2019-10-19 DIAGNOSIS — Z85038 Personal history of other malignant neoplasm of large intestine: Secondary | ICD-10-CM | POA: Diagnosis not present

## 2019-10-19 DIAGNOSIS — Z8719 Personal history of other diseases of the digestive system: Secondary | ICD-10-CM | POA: Diagnosis not present

## 2019-10-19 DIAGNOSIS — K660 Peritoneal adhesions (postprocedural) (postinfection): Secondary | ICD-10-CM | POA: Diagnosis not present

## 2019-10-19 DIAGNOSIS — R197 Diarrhea, unspecified: Secondary | ICD-10-CM | POA: Diagnosis not present

## 2019-10-19 DIAGNOSIS — K2100 Gastro-esophageal reflux disease with esophagitis, without bleeding: Secondary | ICD-10-CM | POA: Diagnosis not present

## 2019-10-19 DIAGNOSIS — D649 Anemia, unspecified: Secondary | ICD-10-CM | POA: Diagnosis not present

## 2019-10-20 DIAGNOSIS — D649 Anemia, unspecified: Secondary | ICD-10-CM | POA: Diagnosis not present

## 2019-10-26 DIAGNOSIS — R310 Gross hematuria: Secondary | ICD-10-CM | POA: Diagnosis not present

## 2019-10-26 DIAGNOSIS — R82998 Other abnormal findings in urine: Secondary | ICD-10-CM | POA: Diagnosis not present

## 2019-10-26 DIAGNOSIS — R8290 Unspecified abnormal findings in urine: Secondary | ICD-10-CM | POA: Diagnosis not present

## 2019-10-26 DIAGNOSIS — N401 Enlarged prostate with lower urinary tract symptoms: Secondary | ICD-10-CM | POA: Diagnosis not present

## 2019-10-26 DIAGNOSIS — N528 Other male erectile dysfunction: Secondary | ICD-10-CM | POA: Diagnosis not present

## 2019-10-26 DIAGNOSIS — N138 Other obstructive and reflux uropathy: Secondary | ICD-10-CM | POA: Diagnosis not present

## 2019-11-30 DIAGNOSIS — D3131 Benign neoplasm of right choroid: Secondary | ICD-10-CM | POA: Diagnosis not present

## 2019-11-30 DIAGNOSIS — Z961 Presence of intraocular lens: Secondary | ICD-10-CM | POA: Diagnosis not present

## 2019-11-30 DIAGNOSIS — H04123 Dry eye syndrome of bilateral lacrimal glands: Secondary | ICD-10-CM | POA: Diagnosis not present

## 2019-12-25 ENCOUNTER — Ambulatory Visit: Payer: Medicare Other | Attending: Internal Medicine

## 2019-12-25 ENCOUNTER — Ambulatory Visit: Payer: PPO

## 2019-12-25 DIAGNOSIS — Z23 Encounter for immunization: Secondary | ICD-10-CM | POA: Insufficient documentation

## 2019-12-25 NOTE — Progress Notes (Signed)
   Covid-19 Vaccination Clinic  Name:  Cory Bright    MRN: [id number] DOB: May 08, 1936  12/25/2019  Mr. Cory Bright was observed post Covid-19 immunization for 15 minutes without incidence. He was provided with Vaccine Information Sheet and instruction to access the V-Safe system.   Mr. Cory Bright was instructed to call 911 with any severe reactions post vaccine: Marland Kitchen Difficulty breathing  . Swelling of your face and throat  . A fast heartbeat  . A bad rash all over your body  . Dizziness and weakness    Immunizations Administered    Name Date Dose VIS Date Route   Pfizer COVID-19 Vaccine 12/25/2019  8:34 AM 0.3 mL 11/20/2019 Intramuscular   Manufacturer: [place]   Lot: [id number]   [place]: [id number]

## 2020-01-12 ENCOUNTER — Ambulatory Visit: Payer: PPO | Attending: Internal Medicine

## 2020-01-12 DIAGNOSIS — Z23 Encounter for immunization: Secondary | ICD-10-CM | POA: Insufficient documentation

## 2020-01-12 NOTE — Progress Notes (Signed)
   Covid-19 Vaccination Clinic  Name:  Cory Bright    MRN: [id number] DOB: 1936/08/26  01/12/2020  [name] was observed post Covid-19 immunization for 15 minutes without incidence. He was provided with Vaccine Information Sheet and instruction to access the V-Safe system.   [name] was instructed to call 911 with any severe reactions post vaccine: Marland Kitchen Difficulty breathing  . Swelling of your face and throat  . A fast heartbeat  . A bad rash all over your body  . Dizziness and weakness    Immunizations Administered    Name Date Dose VIS Date Route   Pfizer COVID-19 Vaccine 01/12/2020  9:54 AM 0.3 mL 11/20/2019 Intramuscular   Manufacturer: [place]   Lot: [id number]   [place]: [id number]

## 2020-02-02 DIAGNOSIS — Z125 Encounter for screening for malignant neoplasm of prostate: Secondary | ICD-10-CM | POA: Diagnosis not present

## 2020-02-02 DIAGNOSIS — I10 Essential (primary) hypertension: Secondary | ICD-10-CM | POA: Diagnosis not present

## 2020-02-02 DIAGNOSIS — R82998 Other abnormal findings in urine: Secondary | ICD-10-CM | POA: Diagnosis not present

## 2020-02-02 DIAGNOSIS — E7849 Other hyperlipidemia: Secondary | ICD-10-CM | POA: Diagnosis not present

## 2020-02-02 DIAGNOSIS — E538 Deficiency of other specified B group vitamins: Secondary | ICD-10-CM | POA: Diagnosis not present

## 2020-02-09 DIAGNOSIS — R221 Localized swelling, mass and lump, neck: Secondary | ICD-10-CM | POA: Diagnosis not present

## 2020-02-09 DIAGNOSIS — H04129 Dry eye syndrome of unspecified lacrimal gland: Secondary | ICD-10-CM | POA: Diagnosis not present

## 2020-02-09 DIAGNOSIS — E785 Hyperlipidemia, unspecified: Secondary | ICD-10-CM | POA: Diagnosis not present

## 2020-02-09 DIAGNOSIS — E538 Deficiency of other specified B group vitamins: Secondary | ICD-10-CM | POA: Diagnosis not present

## 2020-02-09 DIAGNOSIS — D649 Anemia, unspecified: Secondary | ICD-10-CM | POA: Diagnosis not present

## 2020-02-09 DIAGNOSIS — I10 Essential (primary) hypertension: Secondary | ICD-10-CM | POA: Diagnosis not present

## 2020-02-09 DIAGNOSIS — N401 Enlarged prostate with lower urinary tract symptoms: Secondary | ICD-10-CM | POA: Diagnosis not present

## 2020-02-09 DIAGNOSIS — Z0000 Encounter for general adult medical examination without abnormal findings: Secondary | ICD-10-CM | POA: Diagnosis not present

## 2020-02-09 DIAGNOSIS — K219 Gastro-esophageal reflux disease without esophagitis: Secondary | ICD-10-CM | POA: Diagnosis not present

## 2020-02-09 DIAGNOSIS — Z1331 Encounter for screening for depression: Secondary | ICD-10-CM | POA: Diagnosis not present

## 2020-02-09 DIAGNOSIS — C189 Malignant neoplasm of colon, unspecified: Secondary | ICD-10-CM | POA: Diagnosis not present

## 2020-02-25 DIAGNOSIS — L570 Actinic keratosis: Secondary | ICD-10-CM | POA: Diagnosis not present

## 2020-02-25 DIAGNOSIS — L304 Erythema intertrigo: Secondary | ICD-10-CM | POA: Diagnosis not present

## 2020-02-25 DIAGNOSIS — D1801 Hemangioma of skin and subcutaneous tissue: Secondary | ICD-10-CM | POA: Diagnosis not present

## 2020-02-25 DIAGNOSIS — D225 Melanocytic nevi of trunk: Secondary | ICD-10-CM | POA: Diagnosis not present

## 2020-02-25 DIAGNOSIS — L298 Other pruritus: Secondary | ICD-10-CM | POA: Diagnosis not present

## 2020-02-25 DIAGNOSIS — L821 Other seborrheic keratosis: Secondary | ICD-10-CM | POA: Diagnosis not present

## 2020-02-26 DIAGNOSIS — Z1152 Encounter for screening for COVID-19: Secondary | ICD-10-CM | POA: Diagnosis not present

## 2020-03-03 DIAGNOSIS — K209 Esophagitis, unspecified without bleeding: Secondary | ICD-10-CM | POA: Diagnosis not present

## 2020-03-03 DIAGNOSIS — K222 Esophageal obstruction: Secondary | ICD-10-CM | POA: Diagnosis not present

## 2020-03-03 DIAGNOSIS — R4702 Dysphasia: Secondary | ICD-10-CM | POA: Diagnosis not present

## 2020-03-03 DIAGNOSIS — K9171 Accidental puncture and laceration of a digestive system organ or structure during a digestive system procedure: Secondary | ICD-10-CM | POA: Diagnosis not present

## 2020-03-03 DIAGNOSIS — K228 Other specified diseases of esophagus: Secondary | ICD-10-CM | POA: Diagnosis not present

## 2020-03-03 DIAGNOSIS — K219 Gastro-esophageal reflux disease without esophagitis: Secondary | ICD-10-CM | POA: Diagnosis not present

## 2020-03-03 DIAGNOSIS — R1013 Epigastric pain: Secondary | ICD-10-CM | POA: Diagnosis not present

## 2020-03-03 DIAGNOSIS — R1310 Dysphagia, unspecified: Secondary | ICD-10-CM | POA: Diagnosis not present

## 2020-04-01 DIAGNOSIS — R1013 Epigastric pain: Secondary | ICD-10-CM | POA: Diagnosis not present

## 2020-04-01 DIAGNOSIS — R1310 Dysphagia, unspecified: Secondary | ICD-10-CM | POA: Diagnosis not present

## 2020-04-01 DIAGNOSIS — Q398 Other congenital malformations of esophagus: Secondary | ICD-10-CM | POA: Diagnosis not present

## 2020-04-01 DIAGNOSIS — E538 Deficiency of other specified B group vitamins: Secondary | ICD-10-CM | POA: Diagnosis not present

## 2020-04-01 DIAGNOSIS — R112 Nausea with vomiting, unspecified: Secondary | ICD-10-CM | POA: Diagnosis not present

## 2020-04-01 DIAGNOSIS — K56600 Partial intestinal obstruction, unspecified as to cause: Secondary | ICD-10-CM | POA: Diagnosis not present

## 2020-04-01 DIAGNOSIS — D519 Vitamin B12 deficiency anemia, unspecified: Secondary | ICD-10-CM | POA: Diagnosis not present

## 2020-04-01 DIAGNOSIS — K219 Gastro-esophageal reflux disease without esophagitis: Secondary | ICD-10-CM | POA: Diagnosis not present

## 2020-04-06 ENCOUNTER — Other Ambulatory Visit: Payer: Self-pay | Admitting: Internal Medicine

## 2020-04-08 ENCOUNTER — Other Ambulatory Visit: Payer: Self-pay | Admitting: Internal Medicine

## 2020-04-25 DIAGNOSIS — N138 Other obstructive and reflux uropathy: Secondary | ICD-10-CM | POA: Diagnosis not present

## 2020-04-25 DIAGNOSIS — N401 Enlarged prostate with lower urinary tract symptoms: Secondary | ICD-10-CM | POA: Diagnosis not present

## 2020-06-14 DIAGNOSIS — R112 Nausea with vomiting, unspecified: Secondary | ICD-10-CM | POA: Diagnosis not present

## 2020-06-14 DIAGNOSIS — Z8719 Personal history of other diseases of the digestive system: Secondary | ICD-10-CM | POA: Diagnosis not present

## 2020-08-30 DIAGNOSIS — L718 Other rosacea: Secondary | ICD-10-CM | POA: Diagnosis not present

## 2020-08-30 DIAGNOSIS — L819 Disorder of pigmentation, unspecified: Secondary | ICD-10-CM | POA: Diagnosis not present

## 2020-08-30 DIAGNOSIS — L570 Actinic keratosis: Secondary | ICD-10-CM | POA: Diagnosis not present

## 2020-08-30 DIAGNOSIS — L304 Erythema intertrigo: Secondary | ICD-10-CM | POA: Diagnosis not present

## 2020-09-07 DIAGNOSIS — K219 Gastro-esophageal reflux disease without esophagitis: Secondary | ICD-10-CM | POA: Diagnosis not present

## 2020-09-07 DIAGNOSIS — R1013 Epigastric pain: Secondary | ICD-10-CM | POA: Diagnosis not present

## 2020-09-07 DIAGNOSIS — Z85038 Personal history of other malignant neoplasm of large intestine: Secondary | ICD-10-CM | POA: Diagnosis not present

## 2020-09-07 DIAGNOSIS — D649 Anemia, unspecified: Secondary | ICD-10-CM | POA: Diagnosis not present

## 2020-09-07 DIAGNOSIS — I10 Essential (primary) hypertension: Secondary | ICD-10-CM | POA: Diagnosis not present

## 2020-09-07 DIAGNOSIS — E538 Deficiency of other specified B group vitamins: Secondary | ICD-10-CM | POA: Diagnosis not present

## 2020-10-19 ENCOUNTER — Encounter: Payer: PPO | Admitting: Dermatology

## 2020-11-07 DIAGNOSIS — D3131 Benign neoplasm of right choroid: Secondary | ICD-10-CM | POA: Diagnosis not present

## 2020-11-07 DIAGNOSIS — Z961 Presence of intraocular lens: Secondary | ICD-10-CM | POA: Diagnosis not present

## 2020-11-07 DIAGNOSIS — H532 Diplopia: Secondary | ICD-10-CM | POA: Diagnosis not present

## 2020-11-07 DIAGNOSIS — H02403 Unspecified ptosis of bilateral eyelids: Secondary | ICD-10-CM | POA: Diagnosis not present

## 2020-11-07 DIAGNOSIS — H4921 Sixth [abducent] nerve palsy, right eye: Secondary | ICD-10-CM | POA: Diagnosis not present

## 2021-02-07 DIAGNOSIS — H532 Diplopia: Secondary | ICD-10-CM | POA: Diagnosis not present

## 2021-02-07 DIAGNOSIS — H4921 Sixth [abducent] nerve palsy, right eye: Secondary | ICD-10-CM | POA: Diagnosis not present

## 2021-02-07 DIAGNOSIS — E785 Hyperlipidemia, unspecified: Secondary | ICD-10-CM | POA: Diagnosis not present

## 2021-02-07 DIAGNOSIS — Z125 Encounter for screening for malignant neoplasm of prostate: Secondary | ICD-10-CM | POA: Diagnosis not present

## 2021-02-07 DIAGNOSIS — H02403 Unspecified ptosis of bilateral eyelids: Secondary | ICD-10-CM | POA: Diagnosis not present

## 2021-02-13 DIAGNOSIS — Z961 Presence of intraocular lens: Secondary | ICD-10-CM | POA: Diagnosis not present

## 2021-02-13 DIAGNOSIS — D3132 Benign neoplasm of left choroid: Secondary | ICD-10-CM | POA: Diagnosis not present

## 2021-02-13 DIAGNOSIS — H5203 Hypermetropia, bilateral: Secondary | ICD-10-CM | POA: Diagnosis not present

## 2021-02-13 DIAGNOSIS — H524 Presbyopia: Secondary | ICD-10-CM | POA: Diagnosis not present

## 2021-02-13 DIAGNOSIS — H52203 Unspecified astigmatism, bilateral: Secondary | ICD-10-CM | POA: Diagnosis not present

## 2021-02-14 DIAGNOSIS — I10 Essential (primary) hypertension: Secondary | ICD-10-CM | POA: Diagnosis not present

## 2021-02-14 DIAGNOSIS — R1013 Epigastric pain: Secondary | ICD-10-CM | POA: Diagnosis not present

## 2021-02-14 DIAGNOSIS — D649 Anemia, unspecified: Secondary | ICD-10-CM | POA: Diagnosis not present

## 2021-02-14 DIAGNOSIS — H532 Diplopia: Secondary | ICD-10-CM | POA: Diagnosis not present

## 2021-02-14 DIAGNOSIS — K219 Gastro-esophageal reflux disease without esophagitis: Secondary | ICD-10-CM | POA: Diagnosis not present

## 2021-02-14 DIAGNOSIS — E538 Deficiency of other specified B group vitamins: Secondary | ICD-10-CM | POA: Diagnosis not present

## 2021-02-14 DIAGNOSIS — M48061 Spinal stenosis, lumbar region without neurogenic claudication: Secondary | ICD-10-CM | POA: Diagnosis not present

## 2021-02-14 DIAGNOSIS — Z85038 Personal history of other malignant neoplasm of large intestine: Secondary | ICD-10-CM | POA: Diagnosis not present

## 2021-02-14 DIAGNOSIS — Z0000 Encounter for general adult medical examination without abnormal findings: Secondary | ICD-10-CM | POA: Diagnosis not present

## 2021-02-14 DIAGNOSIS — R82998 Other abnormal findings in urine: Secondary | ICD-10-CM | POA: Diagnosis not present

## 2021-02-14 DIAGNOSIS — E785 Hyperlipidemia, unspecified: Secondary | ICD-10-CM | POA: Diagnosis not present

## 2021-02-14 DIAGNOSIS — N401 Enlarged prostate with lower urinary tract symptoms: Secondary | ICD-10-CM | POA: Diagnosis not present

## 2021-02-28 DIAGNOSIS — L570 Actinic keratosis: Secondary | ICD-10-CM | POA: Diagnosis not present

## 2021-02-28 DIAGNOSIS — L819 Disorder of pigmentation, unspecified: Secondary | ICD-10-CM | POA: Diagnosis not present

## 2021-02-28 DIAGNOSIS — L304 Erythema intertrigo: Secondary | ICD-10-CM | POA: Diagnosis not present

## 2021-02-28 DIAGNOSIS — L821 Other seborrheic keratosis: Secondary | ICD-10-CM | POA: Diagnosis not present

## 2021-02-28 DIAGNOSIS — L718 Other rosacea: Secondary | ICD-10-CM | POA: Diagnosis not present

## 2021-02-28 DIAGNOSIS — B351 Tinea unguium: Secondary | ICD-10-CM | POA: Diagnosis not present

## 2021-02-28 DIAGNOSIS — D1801 Hemangioma of skin and subcutaneous tissue: Secondary | ICD-10-CM | POA: Diagnosis not present

## 2021-02-28 DIAGNOSIS — L814 Other melanin hyperpigmentation: Secondary | ICD-10-CM | POA: Diagnosis not present

## 2021-05-18 DIAGNOSIS — N138 Other obstructive and reflux uropathy: Secondary | ICD-10-CM | POA: Diagnosis not present

## 2021-05-18 DIAGNOSIS — N401 Enlarged prostate with lower urinary tract symptoms: Secondary | ICD-10-CM | POA: Diagnosis not present

## 2021-08-31 DIAGNOSIS — L814 Other melanin hyperpigmentation: Secondary | ICD-10-CM | POA: Diagnosis not present

## 2021-08-31 DIAGNOSIS — L570 Actinic keratosis: Secondary | ICD-10-CM | POA: Diagnosis not present

## 2021-08-31 DIAGNOSIS — L821 Other seborrheic keratosis: Secondary | ICD-10-CM | POA: Diagnosis not present

## 2021-08-31 DIAGNOSIS — D225 Melanocytic nevi of trunk: Secondary | ICD-10-CM | POA: Diagnosis not present

## 2022-02-14 DIAGNOSIS — H52203 Unspecified astigmatism, bilateral: Secondary | ICD-10-CM | POA: Diagnosis not present

## 2022-02-14 DIAGNOSIS — H524 Presbyopia: Secondary | ICD-10-CM | POA: Diagnosis not present

## 2022-02-14 DIAGNOSIS — H5203 Hypermetropia, bilateral: Secondary | ICD-10-CM | POA: Diagnosis not present

## 2022-02-14 DIAGNOSIS — Z961 Presence of intraocular lens: Secondary | ICD-10-CM | POA: Diagnosis not present

## 2022-02-16 DIAGNOSIS — Z125 Encounter for screening for malignant neoplasm of prostate: Secondary | ICD-10-CM | POA: Diagnosis not present

## 2022-02-16 DIAGNOSIS — Z7689 Persons encountering health services in other specified circumstances: Secondary | ICD-10-CM | POA: Diagnosis not present

## 2022-02-16 DIAGNOSIS — E785 Hyperlipidemia, unspecified: Secondary | ICD-10-CM | POA: Diagnosis not present

## 2022-02-16 DIAGNOSIS — I10 Essential (primary) hypertension: Secondary | ICD-10-CM | POA: Diagnosis not present

## 2022-02-23 DIAGNOSIS — M48061 Spinal stenosis, lumbar region without neurogenic claudication: Secondary | ICD-10-CM | POA: Diagnosis not present

## 2022-02-23 DIAGNOSIS — Z1331 Encounter for screening for depression: Secondary | ICD-10-CM | POA: Diagnosis not present

## 2022-02-23 DIAGNOSIS — Z85038 Personal history of other malignant neoplasm of large intestine: Secondary | ICD-10-CM | POA: Diagnosis not present

## 2022-02-23 DIAGNOSIS — Z1339 Encounter for screening examination for other mental health and behavioral disorders: Secondary | ICD-10-CM | POA: Diagnosis not present

## 2022-02-23 DIAGNOSIS — R972 Elevated prostate specific antigen [PSA]: Secondary | ICD-10-CM | POA: Diagnosis not present

## 2022-02-23 DIAGNOSIS — N401 Enlarged prostate with lower urinary tract symptoms: Secondary | ICD-10-CM | POA: Diagnosis not present

## 2022-02-23 DIAGNOSIS — E785 Hyperlipidemia, unspecified: Secondary | ICD-10-CM | POA: Diagnosis not present

## 2022-02-23 DIAGNOSIS — E538 Deficiency of other specified B group vitamins: Secondary | ICD-10-CM | POA: Diagnosis not present

## 2022-02-23 DIAGNOSIS — I10 Essential (primary) hypertension: Secondary | ICD-10-CM | POA: Diagnosis not present

## 2022-02-23 DIAGNOSIS — Z0000 Encounter for general adult medical examination without abnormal findings: Secondary | ICD-10-CM | POA: Diagnosis not present

## 2022-02-23 DIAGNOSIS — R82998 Other abnormal findings in urine: Secondary | ICD-10-CM | POA: Diagnosis not present

## 2022-02-23 DIAGNOSIS — K219 Gastro-esophageal reflux disease without esophagitis: Secondary | ICD-10-CM | POA: Diagnosis not present

## 2022-04-12 DIAGNOSIS — N138 Other obstructive and reflux uropathy: Secondary | ICD-10-CM | POA: Diagnosis not present

## 2022-04-12 DIAGNOSIS — N401 Enlarged prostate with lower urinary tract symptoms: Secondary | ICD-10-CM | POA: Diagnosis not present

## 2022-07-17 DIAGNOSIS — K219 Gastro-esophageal reflux disease without esophagitis: Secondary | ICD-10-CM | POA: Diagnosis not present

## 2022-07-17 DIAGNOSIS — R194 Change in bowel habit: Secondary | ICD-10-CM | POA: Diagnosis not present

## 2022-07-17 DIAGNOSIS — R1310 Dysphagia, unspecified: Secondary | ICD-10-CM | POA: Diagnosis not present

## 2022-09-03 DIAGNOSIS — L814 Other melanin hyperpigmentation: Secondary | ICD-10-CM | POA: Diagnosis not present

## 2022-09-03 DIAGNOSIS — D225 Melanocytic nevi of trunk: Secondary | ICD-10-CM | POA: Diagnosis not present

## 2022-09-03 DIAGNOSIS — L298 Other pruritus: Secondary | ICD-10-CM | POA: Diagnosis not present

## 2022-09-03 DIAGNOSIS — L570 Actinic keratosis: Secondary | ICD-10-CM | POA: Diagnosis not present

## 2022-09-03 DIAGNOSIS — L821 Other seborrheic keratosis: Secondary | ICD-10-CM | POA: Diagnosis not present

## 2023-02-09 ENCOUNTER — Inpatient Hospital Stay (HOSPITAL_COMMUNITY)
Admission: EM | Admit: 2023-02-09 | Discharge: 2023-02-13 | DRG: 392 | Disposition: A | Discharge status: Home / Self Care | Payer: PPO | Attending: Internal Medicine | Admitting: Internal Medicine

## 2023-02-09 ENCOUNTER — Encounter (HOSPITAL_COMMUNITY): Payer: Self-pay

## 2023-02-09 ENCOUNTER — Other Ambulatory Visit: Payer: Self-pay

## 2023-02-09 DIAGNOSIS — D649 Anemia, unspecified: Secondary | ICD-10-CM | POA: Diagnosis not present

## 2023-02-09 DIAGNOSIS — R1084 Generalized abdominal pain: Secondary | ICD-10-CM | POA: Diagnosis not present

## 2023-02-09 DIAGNOSIS — K529 Noninfective gastroenteritis and colitis, unspecified: Secondary | ICD-10-CM | POA: Diagnosis not present

## 2023-02-09 DIAGNOSIS — I959 Hypotension, unspecified: Secondary | ICD-10-CM | POA: Diagnosis not present

## 2023-02-09 DIAGNOSIS — Z79899 Other long term (current) drug therapy: Secondary | ICD-10-CM | POA: Diagnosis not present

## 2023-02-09 DIAGNOSIS — I10 Essential (primary) hypertension: Secondary | ICD-10-CM | POA: Diagnosis not present

## 2023-02-09 DIAGNOSIS — R410 Disorientation, unspecified: Secondary | ICD-10-CM

## 2023-02-09 DIAGNOSIS — Z1152 Encounter for screening for COVID-19: Secondary | ICD-10-CM

## 2023-02-09 DIAGNOSIS — K6389 Other specified diseases of intestine: Secondary | ICD-10-CM | POA: Diagnosis not present

## 2023-02-09 DIAGNOSIS — R509 Fever, unspecified: Secondary | ICD-10-CM | POA: Diagnosis not present

## 2023-02-09 DIAGNOSIS — R1031 Right lower quadrant pain: Secondary | ICD-10-CM | POA: Diagnosis not present

## 2023-02-09 DIAGNOSIS — R109 Unspecified abdominal pain: Secondary | ICD-10-CM

## 2023-02-09 DIAGNOSIS — N400 Enlarged prostate without lower urinary tract symptoms: Secondary | ICD-10-CM | POA: Diagnosis not present

## 2023-02-09 DIAGNOSIS — Z85038 Personal history of other malignant neoplasm of large intestine: Secondary | ICD-10-CM | POA: Diagnosis not present

## 2023-02-09 DIAGNOSIS — R6510 Systemic inflammatory response syndrome (SIRS) of non-infectious origin without acute organ dysfunction: Secondary | ICD-10-CM | POA: Diagnosis not present

## 2023-02-09 DIAGNOSIS — Z9889 Other specified postprocedural states: Secondary | ICD-10-CM | POA: Diagnosis not present

## 2023-02-09 DIAGNOSIS — E785 Hyperlipidemia, unspecified: Secondary | ICD-10-CM | POA: Diagnosis present

## 2023-02-09 NOTE — ED Triage Notes (Signed)
Pt. BIB [facility] for a fever and htn. Pt.'s fever with EMS was 103.9. pt. Had a bp of 188/60 pt states that he is having abdominal pain and has had a hard time having a BM. Pt. Gait was unsteady with EMS

## 2023-02-10 ENCOUNTER — Emergency Department (HOSPITAL_COMMUNITY): Payer: PPO

## 2023-02-10 DIAGNOSIS — K529 Noninfective gastroenteritis and colitis, unspecified: Secondary | ICD-10-CM | POA: Diagnosis present

## 2023-02-10 DIAGNOSIS — R6510 Systemic inflammatory response syndrome (SIRS) of non-infectious origin without acute organ dysfunction: Secondary | ICD-10-CM | POA: Diagnosis not present

## 2023-02-10 DIAGNOSIS — E785 Hyperlipidemia, unspecified: Secondary | ICD-10-CM

## 2023-02-10 DIAGNOSIS — N400 Enlarged prostate without lower urinary tract symptoms: Secondary | ICD-10-CM

## 2023-02-10 DIAGNOSIS — I10 Essential (primary) hypertension: Secondary | ICD-10-CM | POA: Diagnosis present

## 2023-02-10 DIAGNOSIS — I959 Hypotension, unspecified: Secondary | ICD-10-CM | POA: Diagnosis present

## 2023-02-10 DIAGNOSIS — R509 Fever, unspecified: Secondary | ICD-10-CM | POA: Insufficient documentation

## 2023-02-10 LAB — URINALYSIS, W/ REFLEX TO CULTURE (INFECTION SUSPECTED)
Bacteria, UA: NONE SEEN
Bilirubin Urine: NEGATIVE
Glucose, UA: NEGATIVE mg/dL
Ketones, ur: 20 mg/dL — AB
Leukocytes,Ua: NEGATIVE
Nitrite: NEGATIVE
Protein, ur: NEGATIVE mg/dL
Specific Gravity, Urine: 1.013 (ref 1.005–1.030)
pH: 6 (ref 5.0–8.0)

## 2023-02-10 LAB — RESP PANEL BY RT-PCR (RSV, FLU A&B, COVID)  RVPGX2
Influenza A by PCR: NEGATIVE
Influenza B by PCR: NEGATIVE
Resp Syncytial Virus by PCR: NEGATIVE
SARS Coronavirus 2 by RT PCR: NEGATIVE

## 2023-02-10 LAB — I-STAT CHEM 8, ED
BUN: 17 mg/dL (ref 8–23)
Calcium, Ion: 1.17 mmol/L (ref 1.15–1.40)
Chloride: 103 mmol/L (ref 98–111)
Creatinine, Ser: 1.1 mg/dL (ref 0.61–1.24)
Glucose, Bld: 132 mg/dL — ABNORMAL HIGH (ref 70–99)
HCT: 35 % — ABNORMAL LOW (ref 39.0–52.0)
Hemoglobin: 11.9 g/dL — ABNORMAL LOW (ref 13.0–17.0)
Potassium: 4 mmol/L (ref 3.5–5.1)
Sodium: 139 mmol/L (ref 135–145)
TCO2: 26 mmol/L (ref 22–32)

## 2023-02-10 LAB — COMPREHENSIVE METABOLIC PANEL
ALT: 17 U/L (ref 0–44)
AST: 27 U/L (ref 15–41)
Albumin: 3.7 g/dL (ref 3.5–5.0)
Alkaline Phosphatase: 65 U/L (ref 38–126)
Anion gap: 7 (ref 5–15)
BUN: 17 mg/dL (ref 8–23)
CO2: 24 mmol/L (ref 22–32)
Calcium: 8.8 mg/dL — ABNORMAL LOW (ref 8.9–10.3)
Chloride: 104 mmol/L (ref 98–111)
Creatinine, Ser: 1.16 mg/dL (ref 0.61–1.24)
GFR, Estimated: >60 mL/min (ref >60)
Glucose, Bld: 132 mg/dL — ABNORMAL HIGH (ref 70–99)
Potassium: 3.8 mmol/L (ref 3.5–5.1)
Sodium: 135 mmol/L (ref 135–145)
Total Bilirubin: 0.7 mg/dL (ref 0.3–1.2)
Total Protein: 8.1 g/dL (ref 6.5–8.1)

## 2023-02-10 LAB — CBC
HCT: 31.4 % — ABNORMAL LOW (ref 39.0–52.0)
Hemoglobin: 10.1 g/dL — ABNORMAL LOW (ref 13.0–17.0)
MCH: 29.9 pg (ref 26.0–34.0)
MCHC: 32.2 g/dL (ref 30.0–36.0)
MCV: 92.9 fL (ref 80.0–100.0)
Platelets: 203 10*3/uL (ref 150–400)
RBC: 3.38 MIL/uL — ABNORMAL LOW (ref 4.22–5.81)
RDW: 13.4 % (ref 11.5–15.5)
WBC: 19.8 10*3/uL — ABNORMAL HIGH (ref 4.0–10.5)
nRBC: 0 % (ref 0.0–0.2)

## 2023-02-10 LAB — CBC WITH DIFFERENTIAL/PLATELET
Abs Immature Granulocytes: 0.06 10*3/uL (ref 0.00–0.07)
Basophils Absolute: 0 10*3/uL (ref 0.0–0.1)
Basophils Relative: 0 %
Eosinophils Absolute: 0 10*3/uL (ref 0.0–0.5)
Eosinophils Relative: 0 %
HCT: 34.3 % — ABNORMAL LOW (ref 39.0–52.0)
Hemoglobin: 11 g/dL — ABNORMAL LOW (ref 13.0–17.0)
Immature Granulocytes: 1 %
Lymphocytes Relative: 4 %
Lymphs Abs: 0.6 10*3/uL — ABNORMAL LOW (ref 0.7–4.0)
MCH: 29.5 pg (ref 26.0–34.0)
MCHC: 32.1 g/dL (ref 30.0–36.0)
MCV: 92 fL (ref 80.0–100.0)
Monocytes Absolute: 0.8 10*3/uL (ref 0.1–1.0)
Monocytes Relative: 6 %
Neutro Abs: 11.6 10*3/uL — ABNORMAL HIGH (ref 1.7–7.7)
Neutrophils Relative %: 89 %
Platelets: 251 10*3/uL (ref 150–400)
RBC: 3.73 MIL/uL — ABNORMAL LOW (ref 4.22–5.81)
RDW: 13.1 % (ref 11.5–15.5)
WBC: 13 10*3/uL — ABNORMAL HIGH (ref 4.0–10.5)
nRBC: 0 % (ref 0.0–0.2)

## 2023-02-10 LAB — LACTIC ACID, PLASMA
Lactic Acid, Venous: 1.6 mmol/L (ref 0.5–1.9)
Lactic Acid, Venous: 1 mmol/L (ref 0.5–1.9)

## 2023-02-10 LAB — CREATININE, SERUM
Creatinine, Ser: 0.88 mg/dL (ref 0.61–1.24)
GFR, Estimated: >60 mL/min (ref >60)

## 2023-02-10 LAB — PROTIME-INR
INR: 1.2 (ref 0.8–1.2)
Prothrombin Time: 15.2 seconds (ref 11.4–15.2)

## 2023-02-10 LAB — APTT: aPTT: 29 seconds (ref 24–36)

## 2023-02-10 MED ORDER — METRONIDAZOLE 500 MG/100ML IV SOLN
500.0000 mg | Freq: Once | INTRAVENOUS | Status: AC
  Administered 2023-02-10: 500 mg via INTRAVENOUS
  Filled 2023-02-10: qty 100

## 2023-02-10 MED ORDER — TAMSULOSIN HCL 0.4 MG PO CAPS
0.4000 mg | ORAL_CAPSULE | Freq: Two times a day (BID) | ORAL | Status: DC
  Administered 2023-02-10 – 2023-02-13 (×7): 0.4 mg via ORAL
  Filled 2023-02-10 (×7): qty 1

## 2023-02-10 MED ORDER — TRAZODONE HCL 50 MG PO TABS: 25.0000 mg | ORAL_TABLET | Freq: Every evening | ORAL | Status: DC | PRN

## 2023-02-10 MED ORDER — HYDROMORPHONE HCL 1 MG/ML IJ SOLN: 0.5000 mg | INTRAMUSCULAR | Status: DC | PRN

## 2023-02-10 MED ORDER — TRAMADOL HCL 50 MG PO TABS
50.0000 mg | ORAL_TABLET | Freq: Four times a day (QID) | ORAL | Status: DC | PRN
  Administered 2023-02-10 – 2023-02-13 (×3): 50 mg via ORAL
  Filled 2023-02-10 (×3): qty 1

## 2023-02-10 MED ORDER — SODIUM CHLORIDE 0.9 % IV SOLN
2.0000 g | Freq: Two times a day (BID) | INTRAVENOUS | Status: DC
  Administered 2023-02-10 – 2023-02-13 (×7): 2 g via INTRAVENOUS
  Filled 2023-02-10 (×7): qty 12.5

## 2023-02-10 MED ORDER — SODIUM CHLORIDE 0.9 % IV SOLN: 2.0000 g | Freq: Three times a day (TID) | INTRAVENOUS | Status: DC

## 2023-02-10 MED ORDER — SODIUM CHLORIDE (PF) 0.9 % IJ SOLN
INTRAMUSCULAR | Status: AC
  Filled 2023-02-10: qty 50

## 2023-02-10 MED ORDER — SODIUM CHLORIDE 0.9 % IV BOLUS (SEPSIS)
1000.0000 mL | Freq: Once | INTRAVENOUS | Status: AC
  Administered 2023-02-10: 1000 mL via INTRAVENOUS

## 2023-02-10 MED ORDER — ACETAMINOPHEN 325 MG PO TABS
650.0000 mg | ORAL_TABLET | Freq: Four times a day (QID) | ORAL | Status: DC | PRN
  Administered 2023-02-10: 650 mg via ORAL
  Filled 2023-02-10: qty 2

## 2023-02-10 MED ORDER — SODIUM CHLORIDE 0.9 % IV SOLN
2.0000 g | Freq: Once | INTRAVENOUS | Status: AC
  Administered 2023-02-10: 2 g via INTRAVENOUS
  Filled 2023-02-10: qty 12.5

## 2023-02-10 MED ORDER — POTASSIUM CHLORIDE IN NACL 20-0.9 MEQ/L-% IV SOLN
INTRAVENOUS | Status: DC
  Filled 2023-02-10 (×7): qty 1000

## 2023-02-10 MED ORDER — HYDRALAZINE HCL 20 MG/ML IJ SOLN: 5.0000 mg | Freq: Four times a day (QID) | INTRAMUSCULAR | Status: DC | PRN

## 2023-02-10 MED ORDER — LACTATED RINGERS IV SOLN: INTRAVENOUS | Status: AC

## 2023-02-10 MED ORDER — ONDANSETRON HCL 4 MG PO TABS: 4.0000 mg | ORAL_TABLET | Freq: Four times a day (QID) | ORAL | Status: DC | PRN

## 2023-02-10 MED ORDER — CYCLOBENZAPRINE HCL 5 MG PO TABS
5.0000 mg | ORAL_TABLET | Freq: Three times a day (TID) | ORAL | Status: DC | PRN
  Administered 2023-02-11 (×2): 5 mg via ORAL
  Filled 2023-02-10 (×2): qty 1

## 2023-02-10 MED ORDER — SODIUM CHLORIDE 0.9 % IV SOLN
1000.0000 mL | INTRAVENOUS | Status: DC
  Administered 2023-02-10: 1000 mL via INTRAVENOUS

## 2023-02-10 MED ORDER — PRAVASTATIN SODIUM 20 MG PO TABS
40.0000 mg | ORAL_TABLET | Freq: Every day | ORAL | Status: DC
  Administered 2023-02-10 – 2023-02-13 (×4): 40 mg via ORAL
  Filled 2023-02-10 (×5): qty 2

## 2023-02-10 MED ORDER — ONDANSETRON HCL 4 MG/2ML IJ SOLN: 4.0000 mg | Freq: Four times a day (QID) | INTRAMUSCULAR | Status: DC | PRN

## 2023-02-10 MED ORDER — METRONIDAZOLE 500 MG/100ML IV SOLN
500.0000 mg | Freq: Two times a day (BID) | INTRAVENOUS | Status: DC
  Administered 2023-02-10 – 2023-02-13 (×7): 500 mg via INTRAVENOUS
  Filled 2023-02-10 (×7): qty 100

## 2023-02-10 MED ORDER — MORPHINE SULFATE (PF) 4 MG/ML IV SOLN: 4.0000 mg | INTRAVENOUS | Status: DC | PRN

## 2023-02-10 MED ORDER — IOHEXOL 300 MG/ML  SOLN
100.0000 mL | Freq: Once | INTRAMUSCULAR | Status: AC | PRN
  Administered 2023-02-10: 100 mL via INTRAVENOUS

## 2023-02-10 MED ORDER — ENOXAPARIN SODIUM 40 MG/0.4ML IJ SOSY
40.0000 mg | PREFILLED_SYRINGE | Freq: Every day | INTRAMUSCULAR | Status: DC
  Administered 2023-02-10 – 2023-02-13 (×4): 40 mg via SUBCUTANEOUS
  Filled 2023-02-10 (×4): qty 0.4

## 2023-02-10 MED ORDER — ACETAMINOPHEN 500 MG PO TABS
1000.0000 mg | ORAL_TABLET | Freq: Once | ORAL | Status: AC
  Administered 2023-02-10: 1000 mg via ORAL
  Filled 2023-02-10: qty 2

## 2023-02-10 NOTE — H&P (Signed)
History and Physical  Cory Bright [id number] DOB: 1936/04/11 DOA: 02/09/2023  PCP: Velna Hatchet, MD   Patient coming from: Home   Chief Complaint: Abdominal pain  HPI: Cory Bright is a 87 y.o. male with medical history significant for chronic anemia, back pain, prior history of colon cancer and hypertension admitted to the hospital with abdominal pain.  Apparently initially the patient was somewhat confused, but this morning at the time of my interview he is a pretty good historian.  He lives with his wife.  He tells me that he has a history of somewhat chronic constipation with some partial small bowel obstruction.  He says that for the last day or so, he has been having some gradually worsening abdominal pain which is epigastric, with some associated abdominal distention.  He said that he had a pretty normal bowel movement yesterday morning about 10 AM.  Did well during the day, but last evening the pain became severe and prompted a call to EMS.  He denies any associated nausea or vomiting, fevers.  On initial arrival in the emergency department, he was found to be febrile and slightly confused.  Currently there is no family present, but apparently wife later presented to the ER and stated that he got confused later in the evening when his abdominal pain got worse.  Currently the patient is actually resting comfortably, he has no complaints.  He appears to be a pretty reliable historian.  ED Course: On evaluation in the emergency department, he was found to have temperature of 103.2, heart rate 102, blood pressure 170/66.  He was saturating 100% on room air.  He was given IV fluids, temperature was controlled and he was kept NPO.  CT scan as noted below, without acute findings.  Review of Systems: Please see HPI for pertinent positives and negatives. A complete 10 system review of systems are otherwise negative.  Past Medical History:  Diagnosis Date   Anemia    Arthritis    Back  pain    Cancer ([place])    colon   Hypertension    Past Surgical History:  Procedure Laterality Date   COLON SURGERY     colectomy, R   COLONOSCOPY WITH PROPOFOL N/A 04/25/2015   Procedure: COLONOSCOPY WITH PROPOFOL;  Surgeon: Garlan Fair, MD;  Location: [facility];  Service: Endoscopy;  Laterality: N/A;   ESOPHAGOGASTRODUODENOSCOPY (EGD) WITH PROPOFOL N/A 10/02/2016   Procedure: ESOPHAGOGASTRODUODENOSCOPY (EGD) WITH PROPOFOL;  Surgeon: Garlan Fair, MD;  Location: [facility];  Service: Endoscopy;  Laterality: N/A;   EYE SURGERY     bilateral cataracts with lens implants   SPINE SURGERY      Social History:  reports that he has never smoked. He has never used smokeless tobacco. He reports current alcohol use. He reports that he does not use drugs.   No Known Allergies  History reviewed. No pertinent family history.   Prior to Admission medications   Medication Sig Start Date End Date Taking? Authorizing Provider  cyclobenzaprine (FLEXERIL) 5 MG tablet Take 5 mg by mouth 3 (three) times daily as needed for muscle spasms.    [provider]  gabapentin (NEURONTIN) 300 MG capsule TAKE ONE CAPSULE BY MOUTH EVERY MORNING AND TAKE TWO CAPSULES BY MOUTH EVERY NIGHT AT BEDTIME 11/05/16   Gerda Diss, DO  HYDROcodone-acetaminophen (NORCO) 10-325 MG per tablet Take 1 tablet by mouth every 6 (six) hours as needed for moderate pain.     [provider]  lisinopril (PRINIVIL,ZESTRIL) 20 MG tablet Take 20 mg by mouth at bedtime.     [provider]  pravastatin (PRAVACHOL) 40 MG tablet Take 40 mg by mouth daily.    [provider]  tamsulosin (FLOMAX) 0.4 MG CAPS capsule Take 0.4 mg by mouth 2 (two) times daily.     [provider]  traMADol (ULTRAM) 50 MG tablet Take 50 mg by mouth every 6 (six) hours as needed.    [provider]    Physical Exam: BP (!) 119/50   Pulse 75   Temp 98.7 F (37.1 C) (Oral)   Resp 19   SpO2  97%   General:  Alert, oriented, calm, in no acute distress, seen lying in fracture in the emergency department.  Looks pretty comfortable.  Endorses some slight abdominal distention but no pain. Eyes: EOMI, clear conjuctivae, white sclerea Neck: supple, no masses, trachea mildline  Cardiovascular: RRR, no murmurs or rubs, no peripheral edema  Respiratory: clear to auscultation bilaterally, no wheezes, no crackles  Abdomen: soft, slightly distended but nontender, normal bowel tones heard  Skin: dry, no rashes  Musculoskeletal: no joint effusions, normal range of motion  Psychiatric: appropriate affect, normal speech  Neurologic: extraocular muscles intact, clear speech, moving all extremities with intact sensorium          Labs on Admission:  Basic Metabolic Panel: Recent Labs  Lab 02/10/23 0038 02/10/23 0109  NA 135 139  K 3.8 4.0  CL 104 103  CO2 24  --   GLUCOSE 132* 132*  BUN 17 17  CREATININE 1.16 1.10  CALCIUM 8.8*  --    Liver Function Tests: Recent Labs  Lab 02/10/23 0038  AST 27  ALT 17  ALKPHOS 65  BILITOT 0.7  PROT 8.1  ALBUMIN 3.7   No results for input(s): "LIPASE", "AMYLASE" in the last 168 hours. No results for input(s): "AMMONIA" in the last 168 hours. CBC: Recent Labs  Lab 02/10/23 0038 02/10/23 0109  WBC 13.0*  --   NEUTROABS 11.6*  --   HGB 11.0* 11.9*  HCT 34.3* 35.0*  MCV 92.0  --   PLT 251  --    Cardiac Enzymes: No results for input(s): "CKTOTAL", "CKMB", "CKMBINDEX", "TROPONINI" in the last 168 hours.  BNP (last 3 results) No results for input(s): "BNP" in the last 8760 hours.  ProBNP (last 3 results) No results for input(s): "PROBNP" in the last 8760 hours.  CBG: No results for input(s): "GLUCAP" in the last 168 hours.  Radiological Exams on Admission: DG Chest Port 1 View  Result Date: 02/10/2023 CLINICAL DATA:  Possible sepsis, fevers EXAM: PORTABLE CHEST 1 VIEW COMPARISON:  06/22/2019 FINDINGS: Cardiac shadow is at the  upper limits of normal in size. Lungs are well aerated bilaterally. No focal infiltrate or effusion is seen. No bony abnormality is noted. IMPRESSION: No active disease. Electronically Signed   By: Inez Catalina M.D.   On: 02/10/2023 02:54   CT ABDOMEN PELVIS W CONTRAST  Result Date: 02/10/2023 CLINICAL DATA:  Right lower quadrant pain, fever EXAM: CT ABDOMEN AND PELVIS WITH CONTRAST TECHNIQUE: Multidetector CT imaging of the abdomen and pelvis was performed using the standard protocol following bolus administration of intravenous contrast. RADIATION DOSE REDUCTION: This exam was performed according to the departmental dose-optimization program which includes automated exposure control, adjustment of the mA and/or kV according to patient size and/or use of iterative reconstruction technique. CONTRAST:  182m OMNIPAQUE IOHEXOL 300 MG/ML  SOLN  COMPARISON:  10/05/2016 FINDINGS: Lower chest: No acute abnormality. Scarring in the lung bases. Coronary artery and aortic calcifications. Hepatobiliary: No focal hepatic abnormality. Gallbladder unremarkable. Pancreas: No focal abnormality or ductal dilatation. Spleen: No focal abnormality.  Normal size. Adrenals/Urinary Tract: Small scattered renal cysts bilaterally. No follow-up imaging recommended. No hydronephrosis. Adrenal glands and urinary bladder unremarkable. Stomach/Bowel: Postoperative changes in right colon. Sigmoid diverticulosis. No active diverticulitis. Large calcification again noted within a dilated distal small bowel loops is essentially unchanged when compared to prior study and dating back to 2013, likely chronic fecalith. No evidence of bowel obstruction. Vascular/Lymphatic: Aortic atherosclerosis. No aneurysm or adenopathy. Reproductive: No visible focal abnormality. Other: No free fluid or free air. Musculoskeletal: No acute bony abnormality. Postoperative changes in the lumbar spine. IMPRESSION: Stable postoperative changes in the right colon. No  evidence of bowel obstruction. Chronic fecalith within a dilated distal small bowel loop, unchanged dating back to 2013. Coronary artery disease, aortic atherosclerosis. No acute findings. Electronically Signed   By: Rolm Baptise M.D.   On: 02/10/2023 02:40    Assessment/Plan Principal Problem:   SIRS (systemic inflammatory response syndrome) ([place]) -patient presents with abdominal pain, relative hypotension (low diastolic), high fever of [id number], meeting SIRS criteria without obvious source.  He has been treated with IV fluids, empiric IV antibiotics.  He is now hemodynamically stable.  Note normal initial lactate.  Urinalysis is not impressive, respiratory panel negative.  Does not seem that peripheral blood cultures were obtained. -Observation admission -Continue IV fluids and supportive care -Pain and nausea control as needed -Continue empiric broad-spectrum antibiotics, will check MRSA screen and add vancomycin if indicated Active Problems:   Fever   Hypotension   HTN (hypertension)   HLD (hyperlipidemia)   BPH (benign prostatic hyperplasia)  DVT prophylaxis: Lovenox  Code Status: Full code  Admission status: Observation   Time spent: 42 minutes  Britain Anagnos Neva Seat MD [facility] Pager [phone]  If 7PM-7AM, please contact night-coverage [email or web address] Password [facility]  02/10/2023, 8:33 AM

## 2023-02-10 NOTE — Progress Notes (Signed)
   02/10/23 1759  Assess: MEWS Score  Temp (!) 102.6 F (39.2 C)  BP 120/68  MAP (mmHg) 75  Pulse Rate 89  Resp 20  SpO2 94 %  O2 Device Room Air  Assess: MEWS Score  MEWS Temp 2  MEWS Systolic 0  MEWS Pulse 0  MEWS RR 0  MEWS LOC 0  MEWS Score 2  MEWS Score Color Yellow  Assess: if the MEWS score is Yellow or Red  Were vital signs taken at a resting state? Yes  Focused Assessment No change from prior assessment  Does the patient meet 2 or more of the SIRS criteria? No  MEWS guidelines implemented  Yes, yellow  Treat  MEWS Interventions Considered administering scheduled or prn medications/treatments as ordered  Take Vital Signs  Increase Vital Sign Frequency  Yellow: Q2hr x1, continue Q4hrs until patient remains green for 12hrs  Escalate  MEWS: Escalate Yellow: Discuss with charge nurse and consider notifying provider and/or RRT  Notify: Charge Nurse/RN  Name of Charge Nurse/RN Notified [facility] [place]  Provider Notification  Provider Name/Title Hollice Gong  Date Provider Notified 02/10/23  Time Provider Notified 1805  Method of Notification Page  Notification Reason Other (Comment) (increased)  Provider response No new orders  Date of Provider Response 02/10/23  Time of Provider Response 1807  Assess: SIRS CRITERIA  SIRS Temperature  1  SIRS Pulse 0  SIRS Respirations  0  SIRS WBC 0  SIRS Score Sum  1

## 2023-02-10 NOTE — Plan of Care (Signed)
  Problem: Coping: Goal: Level of anxiety will decrease Outcome: Progressing   Problem: Pain Managment: Goal: General experience of comfort will improve Outcome: Progressing   Problem: Safety: Goal: Ability to remain free from injury will improve Outcome: Progressing   

## 2023-02-10 NOTE — ED Notes (Signed)
Patient ripped off all of monitor leads, BP cuff, SPO2 and IV while meds infusing. Redirected patient and explained to him that was not safe to do that. Placed patient back in bed and placed back on monitor. Will have to replace IV.

## 2023-02-10 NOTE — Progress Notes (Signed)
A consult was received from an ED physician for Cefepime per pharmacy dosing.  The patient's profile has been reviewed for ht/wt/allergies/indication/available labs.   A one time order has been placed for Cefepime 2gm IV.  Further antibiotics/pharmacy consults should be ordered by admitting physician if indicated.                       Thank you, Netta Cedars PharmD 02/10/2023  12:37 AM

## 2023-02-10 NOTE — Plan of Care (Signed)
  Problem: Education: Goal: Knowledge of General Education information will improve Description: Including pain rating scale, medication(s)/side effects and non-pharmacologic comfort measures Outcome: Progressing   Problem: Clinical Measurements: Goal: Diagnostic test results will improve Outcome: Progressing   Problem: Activity: Goal: Risk for activity intolerance will decrease Outcome: Progressing   

## 2023-02-10 NOTE — Progress Notes (Signed)
PHARMACY NOTE:  ANTIMICROBIAL RENAL DOSAGE ADJUSTMENT  Current antimicrobial regimen includes a mismatch between antimicrobial dosage and estimated renal function.  As per policy approved by the Pharmacy & Therapeutics and Medical Executive Committees, the antimicrobial dosage will be adjusted accordingly.  Current antimicrobial dosage:  cefepime 2 g IV every 8 hours  Indication: Intra-abdominal  Renal Function:  estimated CrCl 48 ml/min using SCr 1.1 and IBW of 68.4 kg  CrCl cannot be calculated (Unknown ideal weight.). '[]'$      On intermittent HD, scheduled: '[]'$      On CRRT    Antimicrobial dosage has been changed to:   cefepime 2 g IV q12h  Additional comments:   Thank you for allowing pharmacy to be a part of this patient's care.  Suzzanne Cloud, [facility] 02/10/2023 8:39 AM

## 2023-02-10 NOTE — Sepsis Progress Note (Signed)
Elink following code sepsis °

## 2023-02-10 NOTE — ED Provider Notes (Signed)
[facility] Provider Note  CSN: [id number] Arrival date & time: 02/09/23 2324  Chief Complaint(s) Fever  HPI Cory Bright is a 87 y.o. male with a past medical history listed below who presents to the emergency department with 1 day of gradually worsening abdominal pain.  Pain became severe this evening prompting a call to EMS.  No nausea or vomiting.  Patient had 1 episode of diarrhea this morning but has had difficulty having a BM after that.  Patient was noted to be febrile and appears to be confused.  Wife arrived after my initial evaluation and reported that his confusion began this evening.  He was noted to be febrile with EMS.  They deny any cough or congestion.  No known sick contacts.  The history is provided by the patient.    Past Medical History Past Medical History:  Diagnosis Date   Anemia    Arthritis    Back pain    Cancer ([place])    colon   Hypertension    Patient Active Problem List   Diagnosis Date Noted   Small bowel obstruction ([place]) 07/22/2014   SBO (small bowel obstruction) ([place]) 07/22/2014   Home Medication(s) Prior to Admission medications   Medication Sig Start Date End Date Taking? Authorizing Provider  cyclobenzaprine (FLEXERIL) 5 MG tablet Take 5 mg by mouth 3 (three) times daily as needed for muscle spasms.    [provider]  gabapentin (NEURONTIN) 300 MG capsule TAKE ONE CAPSULE BY MOUTH EVERY MORNING AND TAKE TWO CAPSULES BY MOUTH EVERY NIGHT AT BEDTIME 11/05/16   Gerda Diss, DO  HYDROcodone-acetaminophen (NORCO) 10-325 MG per tablet Take 1 tablet by mouth every 6 (six) hours as needed for moderate pain.     [provider]  lisinopril (PRINIVIL,ZESTRIL) 20 MG tablet Take 20 mg by mouth at bedtime.     [provider]  pravastatin (PRAVACHOL) 40 MG tablet Take 40 mg by mouth daily.    [provider]  tamsulosin (FLOMAX) 0.4 MG CAPS capsule Take 0.4 mg by  mouth 2 (two) times daily.     [provider]  traMADol (ULTRAM) 50 MG tablet Take 50 mg by mouth every 6 (six) hours as needed.    [provider]                                                                                                                                    Allergies Patient has no known allergies.  Review of Systems Review of Systems As noted in HPI  Physical Exam Vital Signs  I have reviewed the triage vital signs BP (!) 114/53   Pulse 92   Temp (!) 100.4 F (38 C) (Oral)   Resp 18   SpO2 96%   Physical Exam Vitals reviewed.  Constitutional:      General: He is not in acute distress.  Appearance: He is well-developed. He is not diaphoretic.  HENT:     Head: Normocephalic and atraumatic.     Nose: Nose normal.  Eyes:     General: No scleral icterus.       Right eye: No discharge.        Left eye: No discharge.     Conjunctiva/sclera: Conjunctivae normal.     Pupils: Pupils are equal, round, and reactive to light.  Cardiovascular:     Rate and Rhythm: Normal rate and regular rhythm.     Heart sounds: No murmur heard.    No friction rub. No gallop.  Pulmonary:     Effort: Pulmonary effort is normal. No respiratory distress.     Breath sounds: Normal breath sounds. No stridor. No rales.  Abdominal:     General: There is no distension.     Palpations: Abdomen is soft.     Tenderness: There is abdominal tenderness in the right lower quadrant and suprapubic area.  Musculoskeletal:        General: No tenderness.     Cervical back: Normal range of motion and neck supple.  Skin:    General: Skin is warm and dry.     Findings: No erythema or rash.  Neurological:     Mental Status: He is alert. He is confused.     ED Results and Treatments Labs (all labs ordered are listed, but only abnormal results are displayed) Labs Reviewed  COMPREHENSIVE METABOLIC PANEL - Abnormal; Notable for the following components:      Result Value    Glucose, Bld 132 (*)    Calcium 8.8 (*)    All other components within normal limits  CBC WITH DIFFERENTIAL/PLATELET - Abnormal; Notable for the following components:   WBC 13.0 (*)    RBC 3.73 (*)    Hemoglobin 11.0 (*)    HCT 34.3 (*)    Neutro Abs 11.6 (*)    Lymphs Abs 0.6 (*)    All other components within normal limits  URINALYSIS, W/ REFLEX TO CULTURE (INFECTION SUSPECTED) - Abnormal; Notable for the following components:   Hgb urine dipstick MODERATE (*)    Ketones, ur 20 (*)    All other components within normal limits  I-STAT CHEM 8, ED - Abnormal; Notable for the following components:   Glucose, Bld 132 (*)    Hemoglobin 11.9 (*)    HCT 35.0 (*)    All other components within normal limits  RESP PANEL BY RT-PCR (RSV, FLU A&B, COVID)  RVPGX2  CULTURE, BLOOD (ROUTINE X 2)  CULTURE, BLOOD (ROUTINE X 2)  LACTIC ACID, PLASMA  LACTIC ACID, PLASMA  PROTIME-INR  APTT                                                                                                                         EKG   Radiology DG Chest Port 1 View  Result Date: 02/10/2023 CLINICAL DATA:  Possible sepsis, fevers EXAM:  PORTABLE CHEST 1 VIEW COMPARISON:  06/22/2019 FINDINGS: Cardiac shadow is at the upper limits of normal in size. Lungs are well aerated bilaterally. No focal infiltrate or effusion is seen. No bony abnormality is noted. IMPRESSION: No active disease. Electronically Signed   By: Inez Catalina M.D.   On: 02/10/2023 02:54   CT ABDOMEN PELVIS W CONTRAST  Result Date: 02/10/2023 CLINICAL DATA:  Right lower quadrant pain, fever EXAM: CT ABDOMEN AND PELVIS WITH CONTRAST TECHNIQUE: Multidetector CT imaging of the abdomen and pelvis was performed using the standard protocol following bolus administration of intravenous contrast. RADIATION DOSE REDUCTION: This exam was performed according to the departmental dose-optimization program which includes automated exposure control, adjustment of the mA  and/or kV according to patient size and/or use of iterative reconstruction technique. CONTRAST:  161m OMNIPAQUE IOHEXOL 300 MG/ML  SOLN COMPARISON:  10/05/2016 FINDINGS: Lower chest: No acute abnormality. Scarring in the lung bases. Coronary artery and aortic calcifications. Hepatobiliary: No focal hepatic abnormality. Gallbladder unremarkable. Pancreas: No focal abnormality or ductal dilatation. Spleen: No focal abnormality.  Normal size. Adrenals/Urinary Tract: Small scattered renal cysts bilaterally. No follow-up imaging recommended. No hydronephrosis. Adrenal glands and urinary bladder unremarkable. Stomach/Bowel: Postoperative changes in right colon. Sigmoid diverticulosis. No active diverticulitis. Large calcification again noted within a dilated distal small bowel loops is essentially unchanged when compared to prior study and dating back to 2013, likely chronic fecalith. No evidence of bowel obstruction. Vascular/Lymphatic: Aortic atherosclerosis. No aneurysm or adenopathy. Reproductive: No visible focal abnormality. Other: No free fluid or free air. Musculoskeletal: No acute bony abnormality. Postoperative changes in the lumbar spine. IMPRESSION: Stable postoperative changes in the right colon. No evidence of bowel obstruction. Chronic fecalith within a dilated distal small bowel loop, unchanged dating back to 2013. Coronary artery disease, aortic atherosclerosis. No acute findings. Electronically Signed   By: KRolm BaptiseM.D.   On: 02/10/2023 02:40    Medications Ordered in ED Medications  sodium chloride 0.9 % bolus 1,000 mL (0 mLs Intravenous Stopped 02/10/23 0253)    Followed by  0.9 %  sodium chloride infusion (1,000 mLs Intravenous New Bag/Given 02/10/23 0250)  morphine (PF) 4 MG/ML injection 4 mg (has no administration in time range)  lactated ringers infusion (has no administration in time range)  sodium chloride (PF) 0.9 % injection (  Canceled Entry 02/10/23 0252)  ceFEPIme (MAXIPIME) 2 g in  sodium chloride 0.9 % 100 mL IVPB (0 g Intravenous Stopped 02/10/23 0230)  metroNIDAZOLE (FLAGYL) IVPB 500 mg (0 mg Intravenous Stopped 02/10/23 0341)  iohexol (OMNIPAQUE) 300 MG/ML solution 100 mL (100 mLs Intravenous Contrast Given 02/10/23 0231)  acetaminophen (TYLENOL) tablet 1,000 mg (1,000 mg Oral Given 02/10/23 0325)                                                                                                                                     Procedures .Critical Care  Performed  by: Fatima Blank, MD Authorized by: Fatima Blank, MD   Critical care provider statement:    Critical care time (minutes):  45   Critical care time was exclusive of:  Separately billable procedures and treating other patients   Critical care was necessary to treat or prevent imminent or life-threatening deterioration of the following conditions:  Sepsis   Critical care was time spent personally by me on the following activities:  Development of treatment plan with patient or surrogate, discussions with consultants, evaluation of patient's response to treatment, examination of patient, obtaining history from patient or surrogate, review of old charts, re-evaluation of patient's condition, pulse oximetry, ordering and review of radiographic studies, ordering and review of laboratory studies and ordering and performing treatments and interventions   Care discussed with: admitting provider     (including critical care time)  Medical Decision Making / ED Course  Click here for ABCD2, HEART and other calculators  Medical Decision Making Amount and/or Complexity of Data Reviewed Labs: ordered. Radiology: ordered. ECG/medicine tests: ordered.  Risk OTC drugs. Prescription drug management. Decision regarding hospitalization.    Patient presents with abdominal pain and noted to be febrile with confusion  Code sepsis initiated and patient started on empiric antibiotics pending workup. Will  assess for possible viral infection but will look for intra-abdominal inflammatory/infectious process such as appendicitis, acute cholecystitis, or colitis.  Will also assess for urinary tract infection. Will check for pneumonia.  Patient provided with IV fluids.  CBC with leukocytosis.  Mild anemia with stable hemoglobin. Metabolic panel without significant electrolyte derangements or renal sufficiency.  No evidence of biliary obstruction. UA without evidence of infection Viral panel negative for COVID/influenza/RSV Lactic acid normal  Chest x-ray without evidence of pneumonia CT of the abdomen negative  Patient admitted for sepsis of unknown origin.      Final Clinical Impression(s) / ED Diagnoses Final diagnoses:  Fever in adult  Abdominal discomfort  Confusion           This chart was dictated using voice recognition software.  Despite best efforts to proofread,  errors can occur which can change the documentation meaning.    Fatima Blank, MD 02/10/23 [phone]

## 2023-02-11 ENCOUNTER — Observation Stay (HOSPITAL_COMMUNITY): Payer: PPO

## 2023-02-11 DIAGNOSIS — E785 Hyperlipidemia, unspecified: Secondary | ICD-10-CM | POA: Diagnosis present

## 2023-02-11 DIAGNOSIS — R6510 Systemic inflammatory response syndrome (SIRS) of non-infectious origin without acute organ dysfunction: Secondary | ICD-10-CM | POA: Diagnosis present

## 2023-02-11 DIAGNOSIS — K529 Noninfective gastroenteritis and colitis, unspecified: Secondary | ICD-10-CM | POA: Diagnosis present

## 2023-02-11 DIAGNOSIS — R410 Disorientation, unspecified: Secondary | ICD-10-CM | POA: Diagnosis present

## 2023-02-11 DIAGNOSIS — Z1152 Encounter for screening for COVID-19: Secondary | ICD-10-CM | POA: Diagnosis not present

## 2023-02-11 DIAGNOSIS — I10 Essential (primary) hypertension: Secondary | ICD-10-CM | POA: Diagnosis present

## 2023-02-11 DIAGNOSIS — R109 Unspecified abdominal pain: Secondary | ICD-10-CM | POA: Diagnosis not present

## 2023-02-11 DIAGNOSIS — Z79899 Other long term (current) drug therapy: Secondary | ICD-10-CM | POA: Diagnosis not present

## 2023-02-11 DIAGNOSIS — Z85038 Personal history of other malignant neoplasm of large intestine: Secondary | ICD-10-CM | POA: Diagnosis not present

## 2023-02-11 DIAGNOSIS — N400 Enlarged prostate without lower urinary tract symptoms: Secondary | ICD-10-CM | POA: Diagnosis present

## 2023-02-11 DIAGNOSIS — D649 Anemia, unspecified: Secondary | ICD-10-CM | POA: Diagnosis present

## 2023-02-11 DIAGNOSIS — I959 Hypotension, unspecified: Secondary | ICD-10-CM | POA: Diagnosis present

## 2023-02-11 LAB — COMPREHENSIVE METABOLIC PANEL
ALT: 12 U/L (ref 0–44)
AST: 21 U/L (ref 15–41)
Albumin: 2.7 g/dL — ABNORMAL LOW (ref 3.5–5.0)
Alkaline Phosphatase: 44 U/L (ref 38–126)
Anion gap: 6 (ref 5–15)
BUN: 25 mg/dL — ABNORMAL HIGH (ref 8–23)
CO2: 22 mmol/L (ref 22–32)
Calcium: 7.6 mg/dL — ABNORMAL LOW (ref 8.9–10.3)
Chloride: 108 mmol/L (ref 98–111)
Creatinine, Ser: 1.02 mg/dL (ref 0.61–1.24)
GFR, Estimated: >60 mL/min (ref >60)
Glucose, Bld: 133 mg/dL — ABNORMAL HIGH (ref 70–99)
Potassium: 3.8 mmol/L (ref 3.5–5.1)
Sodium: 136 mmol/L (ref 135–145)
Total Bilirubin: 0.8 mg/dL (ref 0.3–1.2)
Total Protein: 6.2 g/dL — ABNORMAL LOW (ref 6.5–8.1)

## 2023-02-11 LAB — C DIFFICILE QUICK SCREEN W PCR REFLEX
C Diff antigen: NEGATIVE
C Diff interpretation: NOT DETECTED
C Diff toxin: NEGATIVE

## 2023-02-11 LAB — CBC
HCT: 27.2 % — ABNORMAL LOW (ref 39.0–52.0)
Hemoglobin: 8.6 g/dL — ABNORMAL LOW (ref 13.0–17.0)
MCH: 29.6 pg (ref 26.0–34.0)
MCHC: 31.6 g/dL (ref 30.0–36.0)
MCV: 93.5 fL (ref 80.0–100.0)
Platelets: 174 10*3/uL (ref 150–400)
RBC: 2.91 MIL/uL — ABNORMAL LOW (ref 4.22–5.81)
RDW: 13.7 % (ref 11.5–15.5)
WBC: 15.1 10*3/uL — ABNORMAL HIGH (ref 4.0–10.5)
nRBC: 0 % (ref 0.0–0.2)

## 2023-02-11 MED ORDER — PANTOPRAZOLE SODIUM 40 MG PO TBEC
40.0000 mg | DELAYED_RELEASE_TABLET | Freq: Every day | ORAL | Status: DC
  Administered 2023-02-11 – 2023-02-13 (×3): 40 mg via ORAL
  Filled 2023-02-11 (×3): qty 1

## 2023-02-11 MED ORDER — GABAPENTIN 300 MG PO CAPS
600.0000 mg | ORAL_CAPSULE | Freq: Every day | ORAL | Status: DC
  Administered 2023-02-11 – 2023-02-12 (×2): 600 mg via ORAL
  Filled 2023-02-11 (×2): qty 2

## 2023-02-11 MED ORDER — GABAPENTIN 300 MG PO CAPS
300.0000 mg | ORAL_CAPSULE | Freq: Every day | ORAL | Status: DC
  Administered 2023-02-12 – 2023-02-13 (×2): 300 mg via ORAL
  Filled 2023-02-11 (×2): qty 1

## 2023-02-11 MED ORDER — MORPHINE SULFATE (PF) 2 MG/ML IV SOLN: 2.0000 mg | INTRAVENOUS | Status: DC | PRN

## 2023-02-11 MED ORDER — GABAPENTIN 300 MG PO CAPS: 300.0000 mg | ORAL_CAPSULE | ORAL | Status: DC

## 2023-02-11 NOTE — Progress Notes (Signed)
Progress Note   Patient: Cory Bright [id number] DOB: October 02, 1936 DOA: 02/09/2023     0 DOS: the patient was seen and examined on 02/11/2023   Brief hospital course: Patient with h/o back pain, prior colon cancer, and HTN who presented with abdominal pain.  Patient was found to have SIRS with fever of 103.2 without obvious sounce.  He was treated with IVF and broad spectrum antibiotics.    Assessment and Plan: * SIRS (systemic inflammatory response syndrome) (HCC) -SIRS criteria in this patient includes: Leukocytosis, fever -Patient has no current evidence of acute organ failure -While awaiting blood cultures, this may be a preseptic condition. -Sepsis protocol initiated -Suspected source is GI infection -Blood and urine cultures pending -Will admit and continue to closely follow -Treat with IV Cefepime and Flagyl -C diff negative -Stool studies pending  Abdominal pain -Patient with h/o SBO and abdominal surgery -His description this AM was concerning for SBO despite negative CT from the night prior so KUB ordered -KUB was unremarkable but prompted reconsideration of the CT -CT re-read shows nonspecific abdominal wall inflammation in the area of his pain -Will treat for colitis based on fever and symptoms -Clear liquids for now  BPH (benign prostatic hyperplasia) -Continue tamsulosin  HLD (hyperlipidemia) -Continue pravastatin  HTN (hypertension) -Hold lisinopril in the setting of marginal BPs -Likely ok to resume in the next day or two     Subjective: He reports remote h/o SBO and was told at that time (2015) not to over-eat.  He walks 5 miles a day and yesterday AM before his walk he had a big breakfast of blueberry pancakes.  He went for his walk and developed severe abdominal pain and weakness with about 2 miles to go.  He barely made it home due to symptoms.  Since this started, he has had 2-3 liquid stools.  Persistent abdominal pain with bloating.  He also had  fevers to 104 at home and is continuing to have fevers (last was 102.6 at 1759).   Physical Exam: Vitals:   02/11/23 0159 02/11/23 0531 02/11/23 1054 02/11/23 1333  BP: 139/60 132/60 (!) 147/55 (!) 137/59  Pulse: 85 84 76 73  Resp: '17 18 18 18  '$ Temp: 100 F (37.8 C) 99.4 F (37.4 C) 98.7 F (37.1 C) 99 F (37.2 C)  TempSrc: Oral Oral  Oral  SpO2: 94% 95% 97% 96%  Weight:      Height:       General:  Appears calm and comfortable and is in NAD Eyes:  normal lids, iris ENT:  grossly normal hearing, lips & tongue, mmm Neck:  no LAD, masses or thyromegaly Cardiovascular:  RRR, no m/r/g. No LE edema.  Respiratory:   CTA bilaterally with no wheezes/rales/rhonchi.  Normal respiratory effort. Abdomen:  distended abdomen with high-pitched tingling bowel sounds in the LUQ, generalized TTP Skin:  no rash or induration seen on limited exam Musculoskeletal:  grossly normal tone BUE/BLE, good ROM, no bony abnormality Psychiatric:  grossly normal mood and affect, speech fluent and appropriate, AOx3 Neurologic:  CN 2-12 grossly intact, moves all extremities in coordinated fashion   Radiological Exams on Admission: Independently reviewed - see discussion in A/P where applicable  CT ABDOMEN PELVIS W CONTRAST  Addendum Date: 02/11/2023   ADDENDUM REPORT: 02/11/2023 10:00 ADDENDUM: Again noted are postsurgical changes involving the right colon but there is new mesenteric edema particularly in the right lower quadrant near the right colon and near the distal small bowel containing the  large calcified fecalith. Concern for small bowel wall thickening adjacent to the large fecalith on image 52/2. This small bowel wall thickening is near the small bowel to colonic anastomosis. Trace free fluid. Impression: New inflammatory changes in the right lower quadrant of the abdomen with probable small bowel wall thickening in the distal small bowel near the colonic anastomosis. There is also trace free fluid in  the abdomen and pelvis. These results were communicated on 02/11/2023 at 9:58 am to provider Karmen Bongo, MD who acknowledged these results. Electronically Signed   By: Markus Daft M.D.   On: 02/11/2023 10:00   Result Date: 02/11/2023 CLINICAL DATA:  Right lower quadrant pain, fever EXAM: CT ABDOMEN AND PELVIS WITH CONTRAST TECHNIQUE: Multidetector CT imaging of the abdomen and pelvis was performed using the standard protocol following bolus administration of intravenous contrast. RADIATION DOSE REDUCTION: This exam was performed according to the departmental dose-optimization program which includes automated exposure control, adjustment of the mA and/or kV according to patient size and/or use of iterative reconstruction technique. CONTRAST:  18m OMNIPAQUE IOHEXOL 300 MG/ML  SOLN COMPARISON:  10/05/2016 FINDINGS: Lower chest: No acute abnormality. Scarring in the lung bases. Coronary artery and aortic calcifications. Hepatobiliary: No focal hepatic abnormality. Gallbladder unremarkable. Pancreas: No focal abnormality or ductal dilatation. Spleen: No focal abnormality.  Normal size. Adrenals/Urinary Tract: Small scattered renal cysts bilaterally. No follow-up imaging recommended. No hydronephrosis. Adrenal glands and urinary bladder unremarkable. Stomach/Bowel: Postoperative changes in right colon. Sigmoid diverticulosis. No active diverticulitis. Large calcification again noted within a dilated distal small bowel loops is essentially unchanged when compared to prior study and dating back to 2013, likely chronic fecalith. No evidence of bowel obstruction. Vascular/Lymphatic: Aortic atherosclerosis. No aneurysm or adenopathy. Reproductive: No visible focal abnormality. Other: No free fluid or free air. Musculoskeletal: No acute bony abnormality. Postoperative changes in the lumbar spine. IMPRESSION: Stable postoperative changes in the right colon. No evidence of bowel obstruction. Chronic fecalith within a dilated  distal small bowel loop, unchanged dating back to 2013. Coronary artery disease, aortic atherosclerosis. No acute findings. Electronically Signed: By: KRolm BaptiseM.D. On: 02/10/2023 02:40   DG Abd 1 View  Result Date: 02/11/2023 CLINICAL DATA:  Abdominal pain. EXAM: ABDOMEN - 1 VIEW COMPARISON:  CT 02/10/2023 FINDINGS: Again noted are gas-filled loops of bowel throughout the abdomen. The degree of bowel gas distension is similar to the recent CT and there is evidence for gas in the right colon. Postsurgical changes in the lower lumbar spine. Again noted is a large calcified structure in the right lower quadrant compatible with a calcified fecalith. Postsurgical changes in the right lower quadrant of the abdomen. Probable chronic changes at the left costophrenic angle. IMPRESSION: 1. Gas-filled loops of bowel throughout the abdomen are similar to the recent CT. There is evidence for gas in the right colon. Overall, nonobstructive bowel gas pattern. 2. Review of recent CT demonstrates new inflammatory changes in the right lower quadrant with trace fluid in the abdomen and pelvis. An addendum will be made to the recent CT examination. Electronically Signed   By: AMarkus DaftM.D.   On: 02/11/2023 09:35   DG Chest Port 1 View  Result Date: 02/10/2023 CLINICAL DATA:  Possible sepsis, fevers EXAM: PORTABLE CHEST 1 VIEW COMPARISON:  06/22/2019 FINDINGS: Cardiac shadow is at the upper limits of normal in size. Lungs are well aerated bilaterally. No focal infiltrate or effusion is seen. No bony abnormality is noted. IMPRESSION: No active disease. Electronically Signed  By: Inez Catalina M.D.   On: 02/10/2023 02:54    EKG: none today   Labs on Admission: I have personally reviewed the available labs and imaging studies at the time of the admission.  Pertinent labs:    Glucose 133 BUN 25 Calcium 7.6 Albumin 2.7 Lactate 1.6, 1.0 WBC 15.1 Hgb 8.6, 10.1 on 3/3 UA: moderate Hgb, 20 ketones COVID/flu/RSV  negative  Family Communication: None present; I attempted to reach his wife later in the day after admission without success.   Disposition: Status is: Admit - still on IV antibiotics  Admit - It is my clinical opinion that admission to INPATIENT is reasonable and necessary because of the expectation that this patient will require hospital care that crosses at least 2 midnights to treat this condition based on the medical complexity of the problems presented.  Given the aforementioned information, the predictability of an adverse outcome is felt to be significant.    Planned Discharge Destination: Home   Time spent: 50 minutes  Author: Karmen Bongo, MD 02/11/2023 5:42 PM  For on call review [email or web address].

## 2023-02-11 NOTE — Assessment & Plan Note (Deleted)
-  Patient with h/o SBO and abdominal surgery -His description this AM was concerning for SBO despite negative CT from the night prior so KUB ordered -KUB was unremarkable but prompted reconsideration of the CT -CT re-read shows nonspecific abdominal wall inflammation in the area of his pain -Will treat for colitis based on fever and symptoms -Clear liquids for now

## 2023-02-11 NOTE — Assessment & Plan Note (Signed)
-  Lisinopril held initially in the setting of marginal BPs -BP has trended back up -Lisinopril has been resumed -Also with prn IV hydralazine

## 2023-02-11 NOTE — Hospital Course (Signed)
Patient with h/o back pain, prior colon cancer, and HTN who presented with abdominal pain.  Patient was found to have SIRS with fever of 103.2 without obvious sounce.  He was treated with IVF and broad spectrum antibiotics.  CT was re-read and had inflammatory changes in the RLQ with probable small bowel wall thickening.  Likely colitis vs. SB enteritis.  Has improved with Cefepime/Flagyl.

## 2023-02-11 NOTE — Assessment & Plan Note (Addendum)
-  SIRS criteria in this patient on presentation included: Leukocytosis, fever - both have resolved -Patient has no current evidence of acute organ failure -Sepsis ruled out -Patient with h/o SBO and abdominal surgery -CT re-read showed nonspecific abdominal wall inflammation in the area of his pain -Suspected source is GI infection, likely colitis vs. Small bowel entertis -Blood cultures negative to date -Patient admitted, will continue to closely follow -Treated with IV Cefepime and Flagyl, will transition to PO antibiotics for discharge as per pharmacy -C diff negative -Stool studies negative

## 2023-02-11 NOTE — Assessment & Plan Note (Signed)
Continue tamsulosin.

## 2023-02-11 NOTE — Plan of Care (Signed)

## 2023-02-11 NOTE — Assessment & Plan Note (Signed)
Continue pravastatin 

## 2023-02-12 DIAGNOSIS — K529 Noninfective gastroenteritis and colitis, unspecified: Secondary | ICD-10-CM | POA: Diagnosis not present

## 2023-02-12 LAB — GASTROINTESTINAL PANEL BY PCR, STOOL (REPLACES STOOL CULTURE)

## 2023-02-12 LAB — BASIC METABOLIC PANEL
Anion gap: 6 (ref 5–15)
BUN: 19 mg/dL (ref 8–23)
CO2: 19 mmol/L — ABNORMAL LOW (ref 22–32)
Calcium: 7.6 mg/dL — ABNORMAL LOW (ref 8.9–10.3)
Chloride: 112 mmol/L — ABNORMAL HIGH (ref 98–111)
Creatinine, Ser: 0.99 mg/dL (ref 0.61–1.24)
GFR, Estimated: >60 mL/min (ref >60)
Glucose, Bld: 96 mg/dL (ref 70–99)
Potassium: 3.5 mmol/L (ref 3.5–5.1)
Sodium: 137 mmol/L (ref 135–145)

## 2023-02-12 LAB — CBC WITH DIFFERENTIAL/PLATELET
Abs Immature Granulocytes: 0.05 10*3/uL (ref 0.00–0.07)
Basophils Absolute: 0 10*3/uL (ref 0.0–0.1)
Basophils Relative: 0 %
Eosinophils Absolute: 0.3 10*3/uL (ref 0.0–0.5)
Eosinophils Relative: 3 %
HCT: 27.2 % — ABNORMAL LOW (ref 39.0–52.0)
Hemoglobin: 8.4 g/dL — ABNORMAL LOW (ref 13.0–17.0)
Immature Granulocytes: 1 %
Lymphocytes Relative: 14 %
Lymphs Abs: 1.5 10*3/uL (ref 0.7–4.0)
MCH: 29.6 pg (ref 26.0–34.0)
MCHC: 30.9 g/dL (ref 30.0–36.0)
MCV: 95.8 fL (ref 80.0–100.0)
Monocytes Absolute: 0.9 10*3/uL (ref 0.1–1.0)
Monocytes Relative: 8 %
Neutro Abs: 7.8 10*3/uL — ABNORMAL HIGH (ref 1.7–7.7)
Neutrophils Relative %: 74 %
Platelets: 173 10*3/uL (ref 150–400)
RBC: 2.84 MIL/uL — ABNORMAL LOW (ref 4.22–5.81)
RDW: 13.7 % (ref 11.5–15.5)
WBC: 10.6 10*3/uL — ABNORMAL HIGH (ref 4.0–10.5)
nRBC: 0 % (ref 0.0–0.2)

## 2023-02-12 LAB — GLUCOSE, CAPILLARY: Glucose-Capillary: 84 mg/dL (ref 70–99)

## 2023-02-12 MED ORDER — LISINOPRIL 20 MG PO TABS
20.0000 mg | ORAL_TABLET | Freq: Every day | ORAL | Status: DC
  Administered 2023-02-12 – 2023-02-13 (×2): 20 mg via ORAL
  Filled 2023-02-12 (×2): qty 1

## 2023-02-12 NOTE — TOC CM/SW Note (Signed)
  Transition of Care [facility]) Screening Note   Patient Details  Name: Cory Bright Date of Birth: 07/05/1936   Transition of Care [facility]) CM/SW Contact:    Lennart Pall, LCSW Phone Number: 02/12/2023, 3:36 PM    Transition of Care Department [facility]) has reviewed patient and no TOC needs have been identified at this time. We will continue to monitor patient advancement through interdisciplinary progression rounds. If new patient transition needs arise, please place a TOC consult.

## 2023-02-12 NOTE — Progress Notes (Signed)
Progress Note   Patient: Cory Bright [id number] DOB: 12-Mar-1936 DOA: 02/09/2023     1 DOS: the patient was seen and examined on 02/12/2023   Brief hospital course: Patient with h/o back pain, prior colon cancer, and HTN who presented with abdominal pain.  Patient was found to have SIRS with fever of 103.2 without obvious sounce.  He was treated with IVF and broad spectrum antibiotics.    Assessment and Plan: * Colitis -SIRS criteria in this patient on presentation included: Leukocytosis, fever - both have resolved -Patient has no current evidence of acute organ failure -Sepsis ruled out -Patient with h/o SBO and abdominal surgery -CT re-read showed nonspecific abdominal wall inflammation in the area of his pain -Suspected source is GI infection, likely colitis vs. Small bowel entertis -Blood cultures negative to date -Patient admitted, will continue to closely follow -Treat with IV Cefepime and Flagyl -C diff negative -Stool studies negative  BPH (benign prostatic hyperplasia) -Continue tamsulosin  HLD (hyperlipidemia) -Continue pravastatin  HTN (hypertension) -Lisinopril held initially in the setting of marginal BPs -BP has trended back up -Lisinopril has been resumed -Also with prn IV hydralazine     Subjective: The patient is feeling better today but still having some soft stools.  He is moving around but not walking as much as usual yet.  He still lacks some energy as well as an appetite.  Physical Exam: Vitals:   02/11/23 1333 02/11/23 2202 02/12/23 0613 02/12/23 1359  BP: (!) 137/59 (!) 156/66 (!) 153/65 (!) 153/68  Pulse: 73 73 71 70  Resp: '18 18 18 17  '$ Temp: 99 F (37.2 C) 99.1 F (37.3 C) 99.2 F (37.3 C) 98 F (36.7 C)  TempSrc: Oral Oral Oral Oral  SpO2: 96% 95% 95% 97%  Weight:      Height:        General:  Appears calm and comfortable and is in NAD Eyes:  normal lids, iris ENT:  grossly normal hearing, lips & tongue, mmm Neck:  no LAD,  masses or thyromegaly Cardiovascular:  RRR, no m/r/g. No LE edema.  Respiratory:   CTA bilaterally with no wheezes/rales/rhonchi.  Normal respiratory effort. Abdomen:  abdominal distention is improving, persistent mild TTP particularly in the RLQ Skin:  no rash or induration seen on limited exam Musculoskeletal:  grossly normal tone BUE/BLE, good ROM, no bony abnormality Psychiatric:  grossly normal mood and affect, speech fluent and appropriate, AOx3 Neurologic:  CN 2-12 grossly intact, moves all extremities in coordinated fashion   Radiological Exams on Admission: Independently reviewed - see discussion in A/P where applicable  DG Abd 1 View  Result Date: 02/11/2023 CLINICAL DATA:  Abdominal pain. EXAM: ABDOMEN - 1 VIEW COMPARISON:  CT 02/10/2023 FINDINGS: Again noted are gas-filled loops of bowel throughout the abdomen. The degree of bowel gas distension is similar to the recent CT and there is evidence for gas in the right colon. Postsurgical changes in the lower lumbar spine. Again noted is a large calcified structure in the right lower quadrant compatible with a calcified fecalith. Postsurgical changes in the right lower quadrant of the abdomen. Probable chronic changes at the left costophrenic angle. IMPRESSION: 1. Gas-filled loops of bowel throughout the abdomen are similar to the recent CT. There is evidence for gas in the right colon. Overall, nonobstructive bowel gas pattern. 2. Review of recent CT demonstrates new inflammatory changes in the right lower quadrant with trace fluid in the abdomen and pelvis. An addendum will be made  to the recent CT examination. Electronically Signed   By: Markus Daft M.D.   On: 02/11/2023 09:35    EKG: none today   Labs on Admission: I have personally reviewed the available labs and imaging studies at the time of the admission.  Pertinent labs:    CO2 19 WBC 10.6 Hgb 8.4; 10.1 on 3/3  Family Communication: I spoke with his wife by telephone the  afternoon after admission  Disposition: Status is: Inpatient Remains inpatient appropriate because: IV antibiotics  Planned Discharge Destination: Home   Time spent: 50 minutes  Author: Karmen Bongo, MD 02/12/2023 3:23 PM  For on call review [email or web address].

## 2023-02-12 NOTE — Plan of Care (Signed)
  Problem: Education: Goal: Knowledge of General Education information will improve Description: Including pain rating scale, medication(s)/side effects and non-pharmacologic comfort measures Outcome: Progressing   Problem: Health Behavior/Discharge Planning: Goal: Ability to manage health-related needs will improve Outcome: Progressing   Problem: Clinical Measurements: Goal: Ability to maintain clinical measurements within normal limits will improve Outcome: Progressing Goal: Will remain free from infection Outcome: Progressing Goal: Diagnostic test results will improve Outcome: Progressing Goal: Respiratory complications will improve Outcome: Progressing Goal: Cardiovascular complication will be avoided Outcome: Progressing   Problem: Activity: Goal: Risk for activity intolerance will decrease Outcome: Progressing   Problem: Nutrition: Goal: Adequate nutrition will be maintained Outcome: Progressing   

## 2023-02-13 DIAGNOSIS — K529 Noninfective gastroenteritis and colitis, unspecified: Secondary | ICD-10-CM | POA: Diagnosis not present

## 2023-02-13 MED ORDER — CIPROFLOXACIN HCL 500 MG PO TABS: 500.0000 mg | ORAL_TABLET | Freq: Two times a day (BID) | ORAL | 0 refills | Status: DC

## 2023-02-13 MED ORDER — METRONIDAZOLE 500 MG PO TABS: 500.0000 mg | ORAL_TABLET | Freq: Three times a day (TID) | ORAL | 0 refills | Status: DC

## 2023-02-13 NOTE — Discharge Summary (Signed)
Physician Discharge Summary   Patient: Cory Bright MRN: [id number] DOB: 24-Oct-1936  Admit date:     02/09/2023  Discharge date: 02/13/23  Discharge Physician: Karmen Bongo   PCP: Velna Hatchet, MD   Recommendations at discharge:   Continue antibiotics as prescribed until gone (Cipro twice daily and Flagyl three times daily through 02/17/2023) Follow up with PCP in 1-2 weeks  Discharge Diagnoses: Principal Problem:   Colitis Active Problems:   HTN (hypertension)   HLD (hyperlipidemia)   BPH (benign prostatic hyperplasia)    Hospital Course: Patient with h/o back pain, prior colon cancer, and HTN who presented with abdominal pain.  Patient was found to have SIRS with fever of 103.2 without obvious sounce.  He was treated with IVF and broad spectrum antibiotics.  CT was re-read and had inflammatory changes in the RLQ with probable small bowel wall thickening.  Likely colitis vs. SB enteritis.  Has improved with Cefepime/Flagyl.  Assessment and Plan: * Colitis -SIRS criteria in this patient on presentation included: Leukocytosis, fever - both have resolved -Patient has no current evidence of acute organ failure -Sepsis ruled out -Patient with h/o SBO and abdominal surgery -CT re-read showed nonspecific abdominal wall inflammation in the area of his pain -Suspected source is GI infection, likely colitis vs. Small bowel entertis -Blood cultures negative to date -Patient admitted, will continue to closely follow -Treated with IV Cefepime and Flagyl, will transition to PO antibiotics for discharge as per pharmacy -C diff negative -Stool studies negative  BPH (benign prostatic hyperplasia) -Continue tamsulosin  HLD (hyperlipidemia) -Continue pravastatin  HTN (hypertension) -Lisinopril held initially in the setting of marginal BPs -BP has trended back up -Lisinopril has been resumed     Consultants: None Procedures performed: None  Disposition: Home Diet  recommendation:  Regular diet DISCHARGE MEDICATION: Allergies as of 02/13/2023       Reactions   Tramadol Itching        Medication List     TAKE these medications    Benefiber Healthy Shape Powd Take 2 Capfuls by mouth daily.   ciprofloxacin 500 MG tablet Commonly known as: Cipro Take 1 tablet (500 mg total) by mouth 2 (two) times daily for 4 days.   fluticasone 0.05 % cream Commonly known as: CUTIVATE Apply 1 Application topically daily as needed (itching).   gabapentin 300 MG capsule Commonly known as: NEURONTIN TAKE ONE CAPSULE BY MOUTH EVERY MORNING AND TAKE TWO CAPSULES BY MOUTH EVERY NIGHT AT BEDTIME What changed: See the new instructions.   lisinopril 20 MG tablet Commonly known as: ZESTRIL Take 20 mg by mouth daily.   metroNIDAZOLE 500 MG tablet Commonly known as: Flagyl Take 1 tablet (500 mg total) by mouth 3 (three) times daily for 4 days.   omeprazole 20 MG capsule Commonly known as: PRILOSEC Take 20 mg by mouth daily.   pravastatin 40 MG tablet Commonly known as: PRAVACHOL Take 40 mg by mouth every evening.   tamsulosin 0.4 MG Caps capsule Commonly known as: FLOMAX Take 0.4 mg by mouth daily.        Discharge Exam:  Subjective:  He reports that he is feeling better - mild diffuse abdominal TTP but no specific pain.  He is tolerating diet but feels bloated with early satiety after eating.  He has been walking and feels ready for discharge.  Filed Weights   02/10/23 0931  Weight: 62 kg   General:  Appears calm and comfortable and is in NAD Eyes:  normal  lids, iris ENT:  grossly normal hearing, lips & tongue, mmm Neck:  no LAD, masses or thyromegaly Cardiovascular:  RRR, no m/r/g. No LE edema.  Respiratory:   CTA bilaterally with no wheezes/rales/rhonchi.  Normal respiratory effort. Abdomen:  abdominal distention resolved, minimal diffuse TTP Skin:  no rash or induration seen on limited exam Musculoskeletal:  grossly normal tone BUE/BLE,  good ROM, no bony abnormality Psychiatric:  grossly normal mood and affect, speech fluent and appropriate, AOx3 Neurologic:  CN 2-12 grossly intact, moves all extremities in coordinated fashion   Radiological Exams on Admission: Independently reviewed - see discussion in A/P where applicable  No results found.  EKG: none today   Labs on Admission: I have personally reviewed the available labs and imaging studies at the time of the admission.  Pertinent labs:    CO2 19 WBC 10.6, improved from 15.1 Hgb 8.4 - stable  Condition at discharge: good  The results of significant diagnostics from this hospitalization (including imaging, microbiology, ancillary and laboratory) are listed below for reference.   Imaging Studies: CT ABDOMEN PELVIS W CONTRAST  Addendum Date: 02/11/2023   ADDENDUM REPORT: 02/11/2023 10:00 ADDENDUM: Again noted are postsurgical changes involving the right colon but there is new mesenteric edema particularly in the right lower quadrant near the right colon and near the distal small bowel containing the large calcified fecalith. Concern for small bowel wall thickening adjacent to the large fecalith on image 52/2. This small bowel wall thickening is near the small bowel to colonic anastomosis. Trace free fluid. Impression: New inflammatory changes in the right lower quadrant of the abdomen with probable small bowel wall thickening in the distal small bowel near the colonic anastomosis. There is also trace free fluid in the abdomen and pelvis. These results were communicated on 02/11/2023 at 9:58 am to provider Karmen Bongo, MD who acknowledged these results. Electronically Signed   By: Markus Daft M.D.   On: 02/11/2023 10:00   Result Date: 02/11/2023 CLINICAL DATA:  Right lower quadrant pain, fever EXAM: CT ABDOMEN AND PELVIS WITH CONTRAST TECHNIQUE: Multidetector CT imaging of the abdomen and pelvis was performed using the standard protocol following bolus administration of  intravenous contrast. RADIATION DOSE REDUCTION: This exam was performed according to the departmental dose-optimization program which includes automated exposure control, adjustment of the mA and/or kV according to patient size and/or use of iterative reconstruction technique. CONTRAST:  119m OMNIPAQUE IOHEXOL 300 MG/ML  SOLN COMPARISON:  10/05/2016 FINDINGS: Lower chest: No acute abnormality. Scarring in the lung bases. Coronary artery and aortic calcifications. Hepatobiliary: No focal hepatic abnormality. Gallbladder unremarkable. Pancreas: No focal abnormality or ductal dilatation. Spleen: No focal abnormality.  Normal size. Adrenals/Urinary Tract: Small scattered renal cysts bilaterally. No follow-up imaging recommended. No hydronephrosis. Adrenal glands and urinary bladder unremarkable. Stomach/Bowel: Postoperative changes in right colon. Sigmoid diverticulosis. No active diverticulitis. Large calcification again noted within a dilated distal small bowel loops is essentially unchanged when compared to prior study and dating back to 2013, likely chronic fecalith. No evidence of bowel obstruction. Vascular/Lymphatic: Aortic atherosclerosis. No aneurysm or adenopathy. Reproductive: No visible focal abnormality. Other: No free fluid or free air. Musculoskeletal: No acute bony abnormality. Postoperative changes in the lumbar spine. IMPRESSION: Stable postoperative changes in the right colon. No evidence of bowel obstruction. Chronic fecalith within a dilated distal small bowel loop, unchanged dating back to 2013. Coronary artery disease, aortic atherosclerosis. No acute findings. Electronically Signed: By: KRolm BaptiseM.D. On: 02/10/2023 02:40   DG  Abd 1 View  Result Date: 02/11/2023 CLINICAL DATA:  Abdominal pain. EXAM: ABDOMEN - 1 VIEW COMPARISON:  CT 02/10/2023 FINDINGS: Again noted are gas-filled loops of bowel throughout the abdomen. The degree of bowel gas distension is similar to the recent CT and there  is evidence for gas in the right colon. Postsurgical changes in the lower lumbar spine. Again noted is a large calcified structure in the right lower quadrant compatible with a calcified fecalith. Postsurgical changes in the right lower quadrant of the abdomen. Probable chronic changes at the left costophrenic angle. IMPRESSION: 1. Gas-filled loops of bowel throughout the abdomen are similar to the recent CT. There is evidence for gas in the right colon. Overall, nonobstructive bowel gas pattern. 2. Review of recent CT demonstrates new inflammatory changes in the right lower quadrant with trace fluid in the abdomen and pelvis. An addendum will be made to the recent CT examination. Electronically Signed   By: Markus Daft M.D.   On: 02/11/2023 09:35   DG Chest Port 1 View  Result Date: 02/10/2023 CLINICAL DATA:  Possible sepsis, fevers EXAM: PORTABLE CHEST 1 VIEW COMPARISON:  06/22/2019 FINDINGS: Cardiac shadow is at the upper limits of normal in size. Lungs are well aerated bilaterally. No focal infiltrate or effusion is seen. No bony abnormality is noted. IMPRESSION: No active disease. Electronically Signed   By: Inez Catalina M.D.   On: 02/10/2023 02:54    Microbiology: Results for orders placed or performed during the hospital encounter of 02/09/23  Blood Culture (routine x 2)     Status: None (Preliminary result)   Collection Time: 02/10/23 12:39 AM   Specimen: BLOOD  Result Value Ref Range Status   Specimen Description   Final    BLOOD RIGHT ANTECUBITAL Performed at [facility] [address], [place], [place] [address]    Special Requests   Final    BOTTLES DRAWN AEROBIC AND ANAEROBIC Blood Culture adequate volume Performed at [facility] [address], [place], [place] [address]    Culture   Final    NO GROWTH 3 DAYS Performed at [facility], [place] [address] [place], [place] [address]    Report Status PENDING  Incomplete  Blood Culture  (routine x 2)     Status: None (Preliminary result)   Collection Time: 02/10/23 12:49 AM   Specimen: BLOOD  Result Value Ref Range Status   Specimen Description   Final    BLOOD BLOOD RIGHT FOREARM Performed at [facility] [address], [place], [place] [address]    Special Requests   Final    BOTTLES DRAWN AEROBIC AND ANAEROBIC Blood Culture results may not be optimal due to an inadequate volume of blood received in culture bottles Performed at [facility] [address] [place], [place] [address]    Culture   Final    NO GROWTH 3 DAYS Performed at [facility], [place] [address], [place], [place] [address]    Report Status PENDING  Incomplete  Resp panel by RT-PCR (RSV, Flu A&B, Covid) Anterior Nasal Swab     Status: None   Collection Time: 02/10/23  1:24 AM   Specimen: Anterior Nasal Swab  Result Value Ref Range Status   SARS Coronavirus 2 by RT PCR NEGATIVE NEGATIVE Final    Comment: (NOTE) SARS-CoV-2 target nucleic acids are NOT DETECTED.  The SARS-CoV-2 RNA is generally detectable in upper respiratory specimens during the  acute phase of infection. The lowest concentration of SARS-CoV-2 viral copies this assay can detect is 138 copies/mL. A negative result does not preclude SARS-Cov-2 infection and should not be used as the sole basis for treatment or other patient management decisions. A negative result may occur with  improper specimen collection/handling, submission of specimen other than nasopharyngeal swab, presence of viral mutation(s) within the areas targeted by this assay, and inadequate number of viral copies(<138 copies/mL). A negative result must be combined with clinical observations, patient history, and epidemiological information. The expected result is Negative.  Fact Sheet for Patients:  EntrepreneurPulse.com.au  Fact Sheet for Healthcare Providers:   IncredibleEmployment.be  This test is no t yet approved or cleared by the [place] FDA and  has been authorized for detection and/or diagnosis of SARS-CoV-2 by FDA under an Emergency Use Authorization (EUA). This EUA will remain  in effect (meaning this test can be used) for the duration of the COVID-19 declaration under Section 564(b)(1) of the Act, 21 U.S.C.section 360bbb-3(b)(1), unless the authorization is terminated  or revoked sooner.       Influenza A by PCR NEGATIVE NEGATIVE Final   Influenza B by PCR NEGATIVE NEGATIVE Final    Comment: (NOTE) The Xpert Xpress SARS-CoV-2/FLU/RSV plus assay is intended as an aid in the diagnosis of influenza from Nasopharyngeal swab specimens and should not be used as a sole basis for treatment. Nasal washings and aspirates are unacceptable for Xpert Xpress SARS-CoV-2/FLU/RSV testing.  Fact Sheet for Patients: EntrepreneurPulse.com.au  Fact Sheet for Healthcare Providers: IncredibleEmployment.be  This test is not yet approved or cleared by the [place] FDA and has been authorized for detection and/or diagnosis of SARS-CoV-2 by FDA under an Emergency Use Authorization (EUA). This EUA will remain in effect (meaning this test can be used) for the duration of the COVID-19 declaration under Section 564(b)(1) of the Act, 21 U.S.C. section 360bbb-3(b)(1), unless the authorization is terminated or revoked.     Resp Syncytial Virus by PCR NEGATIVE NEGATIVE Final    Comment: (NOTE) Fact Sheet for Patients: EntrepreneurPulse.com.au  Fact Sheet for Healthcare Providers: IncredibleEmployment.be  This test is not yet approved or cleared by the [place] FDA and has been authorized for detection and/or diagnosis of SARS-CoV-2 by FDA under an Emergency Use Authorization (EUA). This EUA will remain in effect (meaning this test can be used) for  the duration of the COVID-19 declaration under Section 564(b)(1) of the Act, 21 U.S.C. section 360bbb-3(b)(1), unless the authorization is terminated or revoked.  Performed at [facility], [place] [address] [place], [place] [address]   C Difficile Quick Screen w PCR reflex     Status: None   Collection Time: 02/11/23 10:57 AM   Specimen: STOOL  Result Value Ref Range Status   C Diff antigen NEGATIVE NEGATIVE Final   C Diff toxin NEGATIVE NEGATIVE Final   C Diff interpretation No C. difficile detected.  Final    Comment: Performed at [facility], [place] [address] [place], [place] [address]  Gastrointestinal Panel by PCR , Stool     Status: None   Collection Time: 02/11/23 10:57 AM   Specimen: STOOL  Result Value Ref Range Status   Campylobacter species NOT DETECTED NOT DETECTED Final   Plesimonas shigelloides NOT DETECTED NOT DETECTED Final   Salmonella species NOT DETECTED NOT DETECTED Final   Yersinia enterocolitica NOT DETECTED NOT DETECTED Final   Vibrio species NOT DETECTED NOT DETECTED Final   Vibrio  cholerae NOT DETECTED NOT DETECTED Final   Enteroaggregative E coli (EAEC) NOT DETECTED NOT DETECTED Final   Enteropathogenic E coli (EPEC) NOT DETECTED NOT DETECTED Final   Enterotoxigenic E coli (ETEC) NOT DETECTED NOT DETECTED Final   Shiga like toxin producing E coli (STEC) NOT DETECTED NOT DETECTED Final   Shigella/Enteroinvasive E coli (EIEC) NOT DETECTED NOT DETECTED Final   Cryptosporidium NOT DETECTED NOT DETECTED Final   Cyclospora cayetanensis NOT DETECTED NOT DETECTED Final   Entamoeba histolytica NOT DETECTED NOT DETECTED Final   Giardia lamblia NOT DETECTED NOT DETECTED Final   Adenovirus F40/41 NOT DETECTED NOT DETECTED Final   Astrovirus NOT DETECTED NOT DETECTED Final   Norovirus GI/GII NOT DETECTED NOT DETECTED Final   Rotavirus A NOT DETECTED NOT DETECTED Final   Sapovirus (I, II, IV, and V) NOT DETECTED NOT DETECTED  Final    Comment: Performed at [facility], [address] [address] [place], [place] [address]       Discharge time spent: greater than 30 minutes.  Signed: Karmen Bongo, MD [facility] 02/13/2023

## 2023-02-13 NOTE — Plan of Care (Signed)
  Problem: Education: Goal: Knowledge of General Education information will improve Description: Including pain rating scale, medication(s)/side effects and non-pharmacologic comfort measures Outcome: Progressing   Problem: Health Behavior/Discharge Planning: Goal: Ability to manage health-related needs will improve Outcome: Progressing   Problem: Clinical Measurements: Goal: Ability to maintain clinical measurements within normal limits will improve Outcome: Progressing Goal: Will remain free from infection Outcome: Progressing Goal: Diagnostic test results will improve Outcome: Progressing Goal: Respiratory complications will improve Outcome: Progressing Goal: Cardiovascular complication will be avoided Outcome: Progressing   Problem: Activity: Goal: Risk for activity intolerance will decrease Outcome: Progressing   Problem: Nutrition: Goal: Adequate nutrition will be maintained Outcome: Progressing   

## 2023-02-15 LAB — CULTURE, BLOOD (ROUTINE X 2)
Culture: NO GROWTH
Culture: NO GROWTH
Special Requests: ADEQUATE

## 2023-02-17 ENCOUNTER — Other Ambulatory Visit: Payer: Self-pay

## 2023-02-17 ENCOUNTER — Emergency Department (HOSPITAL_COMMUNITY): Payer: PPO

## 2023-02-17 ENCOUNTER — Encounter (HOSPITAL_COMMUNITY): Payer: Self-pay

## 2023-02-17 ENCOUNTER — Encounter (HOSPITAL_COMMUNITY): Payer: Self-pay | Admitting: Internal Medicine

## 2023-02-17 ENCOUNTER — Inpatient Hospital Stay (HOSPITAL_COMMUNITY): Payer: PPO

## 2023-02-17 ENCOUNTER — Ambulatory Visit (HOSPITAL_COMMUNITY)
Admission: EM | Admit: 2023-02-17 | Discharge: 2023-02-17 | Disposition: A | Discharge status: Home / Self Care | Payer: PPO | Attending: Family Medicine | Admitting: Family Medicine

## 2023-02-17 ENCOUNTER — Inpatient Hospital Stay (HOSPITAL_COMMUNITY)
Admission: EM | Admit: 2023-02-17 | Discharge: 2023-02-21 | DRG: 390 | Disposition: A | Discharge status: Home / Self Care | Payer: PPO | Attending: Internal Medicine | Admitting: Internal Medicine

## 2023-02-17 DIAGNOSIS — E876 Hypokalemia: Secondary | ICD-10-CM | POA: Diagnosis not present

## 2023-02-17 DIAGNOSIS — Z85038 Personal history of other malignant neoplasm of large intestine: Secondary | ICD-10-CM

## 2023-02-17 DIAGNOSIS — E785 Hyperlipidemia, unspecified: Secondary | ICD-10-CM | POA: Diagnosis not present

## 2023-02-17 DIAGNOSIS — Z79899 Other long term (current) drug therapy: Secondary | ICD-10-CM

## 2023-02-17 DIAGNOSIS — Z885 Allergy status to narcotic agent status: Secondary | ICD-10-CM | POA: Diagnosis not present

## 2023-02-17 DIAGNOSIS — D649 Anemia, unspecified: Secondary | ICD-10-CM | POA: Diagnosis not present

## 2023-02-17 DIAGNOSIS — Z9049 Acquired absence of other specified parts of digestive tract: Secondary | ICD-10-CM | POA: Diagnosis not present

## 2023-02-17 DIAGNOSIS — K56600 Partial intestinal obstruction, unspecified as to cause: Secondary | ICD-10-CM | POA: Diagnosis not present

## 2023-02-17 DIAGNOSIS — N400 Enlarged prostate without lower urinary tract symptoms: Secondary | ICD-10-CM | POA: Diagnosis not present

## 2023-02-17 DIAGNOSIS — I771 Stricture of artery: Secondary | ICD-10-CM | POA: Diagnosis present

## 2023-02-17 DIAGNOSIS — K529 Noninfective gastroenteritis and colitis, unspecified: Secondary | ICD-10-CM | POA: Diagnosis present

## 2023-02-17 DIAGNOSIS — K56609 Unspecified intestinal obstruction, unspecified as to partial versus complete obstruction: Secondary | ICD-10-CM | POA: Diagnosis not present

## 2023-02-17 DIAGNOSIS — Z1152 Encounter for screening for COVID-19: Secondary | ICD-10-CM

## 2023-02-17 DIAGNOSIS — Z4682 Encounter for fitting and adjustment of non-vascular catheter: Secondary | ICD-10-CM | POA: Diagnosis not present

## 2023-02-17 DIAGNOSIS — I10 Essential (primary) hypertension: Secondary | ICD-10-CM | POA: Diagnosis not present

## 2023-02-17 DIAGNOSIS — R1084 Generalized abdominal pain: Secondary | ICD-10-CM | POA: Diagnosis not present

## 2023-02-17 DIAGNOSIS — N289 Disorder of kidney and ureter, unspecified: Secondary | ICD-10-CM

## 2023-02-17 DIAGNOSIS — Z66 Do not resuscitate: Secondary | ICD-10-CM | POA: Diagnosis not present

## 2023-02-17 LAB — CBC WITH DIFFERENTIAL/PLATELET
Abs Immature Granulocytes: 0.09 10*3/uL — ABNORMAL HIGH (ref 0.00–0.07)
Basophils Absolute: 0 10*3/uL (ref 0.0–0.1)
Basophils Relative: 0 %
Eosinophils Absolute: 0.1 10*3/uL (ref 0.0–0.5)
Eosinophils Relative: 1 %
HCT: 33.2 % — ABNORMAL LOW (ref 39.0–52.0)
Hemoglobin: 10.7 g/dL — ABNORMAL LOW (ref 13.0–17.0)
Immature Granulocytes: 1 %
Lymphocytes Relative: 14 %
Lymphs Abs: 1.5 10*3/uL (ref 0.7–4.0)
MCH: 29.7 pg (ref 26.0–34.0)
MCHC: 32.2 g/dL (ref 30.0–36.0)
MCV: 92.2 fL (ref 80.0–100.0)
Monocytes Absolute: 1 10*3/uL (ref 0.1–1.0)
Monocytes Relative: 10 %
Neutro Abs: 7.8 10*3/uL — ABNORMAL HIGH (ref 1.7–7.7)
Neutrophils Relative %: 74 %
Platelets: 408 10*3/uL — ABNORMAL HIGH (ref 150–400)
RBC: 3.6 MIL/uL — ABNORMAL LOW (ref 4.22–5.81)
RDW: 13.5 % (ref 11.5–15.5)
WBC: 10.6 10*3/uL — ABNORMAL HIGH (ref 4.0–10.5)
nRBC: 0 % (ref 0.0–0.2)

## 2023-02-17 LAB — RESP PANEL BY RT-PCR (RSV, FLU A&B, COVID)  RVPGX2
Influenza A by PCR: NEGATIVE
Influenza B by PCR: NEGATIVE
Resp Syncytial Virus by PCR: NEGATIVE
SARS Coronavirus 2 by RT PCR: NEGATIVE

## 2023-02-17 LAB — URINALYSIS, ROUTINE W REFLEX MICROSCOPIC
Bilirubin Urine: NEGATIVE
Glucose, UA: NEGATIVE mg/dL
Ketones, ur: NEGATIVE mg/dL
Leukocytes,Ua: NEGATIVE
Nitrite: NEGATIVE
Specific Gravity, Urine: 1.02 (ref 1.005–1.030)
pH: 5.5 (ref 5.0–8.0)

## 2023-02-17 LAB — COMPREHENSIVE METABOLIC PANEL
ALT: 25 U/L (ref 0–44)
AST: 41 U/L (ref 15–41)
Albumin: 3.4 g/dL — ABNORMAL LOW (ref 3.5–5.0)
Alkaline Phosphatase: 54 U/L (ref 38–126)
Anion gap: 9 (ref 5–15)
BUN: 15 mg/dL (ref 8–23)
CO2: 25 mmol/L (ref 22–32)
Calcium: 8.7 mg/dL — ABNORMAL LOW (ref 8.9–10.3)
Chloride: 105 mmol/L (ref 98–111)
Creatinine, Ser: 1.37 mg/dL — ABNORMAL HIGH (ref 0.61–1.24)
GFR, Estimated: 51 mL/min — ABNORMAL LOW (ref >60)
Glucose, Bld: 137 mg/dL — ABNORMAL HIGH (ref 70–99)
Potassium: 4.1 mmol/L (ref 3.5–5.1)
Sodium: 139 mmol/L (ref 135–145)
Total Bilirubin: 0.8 mg/dL (ref 0.3–1.2)
Total Protein: 7.5 g/dL (ref 6.5–8.1)

## 2023-02-17 LAB — LACTIC ACID, PLASMA: Lactic Acid, Venous: 1.3 mmol/L (ref 0.5–1.9)

## 2023-02-17 LAB — URINALYSIS, MICROSCOPIC (REFLEX)
Bacteria, UA: NONE SEEN
Squamous Epithelial / HPF: NONE SEEN /HPF (ref 0–5)
WBC, UA: NONE SEEN WBC/hpf (ref 0–5)

## 2023-02-17 LAB — LIPASE, BLOOD: Lipase: 32 U/L (ref 11–51)

## 2023-02-17 MED ORDER — METRONIDAZOLE 500 MG PO TABS: 500.0000 mg | ORAL_TABLET | Freq: Two times a day (BID) | ORAL | Status: DC

## 2023-02-17 MED ORDER — PHENOL 1.4 % MT LIQD
1.0000 | OROMUCOSAL | Status: DC | PRN
  Filled 2023-02-17: qty 177

## 2023-02-17 MED ORDER — LACTATED RINGERS IV SOLN: INTRAVENOUS | Status: DC

## 2023-02-17 MED ORDER — ONDANSETRON 4 MG PO TBDP: 4.0000 mg | ORAL_TABLET | Freq: Four times a day (QID) | ORAL | Status: DC | PRN

## 2023-02-17 MED ORDER — GABAPENTIN 250 MG/5ML PO SOLN
300.0000 mg | Freq: Every day | ORAL | Status: DC
  Administered 2023-02-19 – 2023-02-20 (×2): 300 mg
  Filled 2023-02-17 (×4): qty 6

## 2023-02-17 MED ORDER — IOHEXOL 350 MG/ML SOLN
100.0000 mL | Freq: Once | INTRAVENOUS | Status: AC | PRN
  Administered 2023-02-17: 100 mL via INTRAVENOUS

## 2023-02-17 MED ORDER — SODIUM CHLORIDE 0.9 % IV BOLUS
500.0000 mL | Freq: Once | INTRAVENOUS | Status: AC
  Administered 2023-02-17: 500 mL via INTRAVENOUS

## 2023-02-17 MED ORDER — LIDOCAINE VISCOUS HCL 2 % MT SOLN
15.0000 mL | Freq: Once | OROMUCOSAL | Status: AC
  Administered 2023-02-17: 15 mL via OROMUCOSAL
  Filled 2023-02-17: qty 15

## 2023-02-17 MED ORDER — CIPROFLOXACIN HCL 500 MG PO TABS: 500.0000 mg | ORAL_TABLET | Freq: Two times a day (BID) | ORAL | Status: DC

## 2023-02-17 MED ORDER — GABAPENTIN 300 MG PO CAPS: 300.0000 mg | ORAL_CAPSULE | ORAL | Status: DC

## 2023-02-17 MED ORDER — ONDANSETRON HCL 4 MG/2ML IJ SOLN: 4.0000 mg | Freq: Four times a day (QID) | INTRAMUSCULAR | Status: DC | PRN

## 2023-02-17 MED ORDER — DIATRIZOATE MEGLUMINE & SODIUM 66-10 % PO SOLN
90.0000 mL | Freq: Once | ORAL | Status: AC
  Administered 2023-02-17: 90 mL via NASOGASTRIC
  Filled 2023-02-17 (×2): qty 90

## 2023-02-17 MED ORDER — ACETAMINOPHEN 325 MG PO TABS: 650.0000 mg | ORAL_TABLET | Freq: Four times a day (QID) | ORAL | Status: DC | PRN

## 2023-02-17 MED ORDER — ACETAMINOPHEN 160 MG/5ML PO SOLN: 650.0000 mg | Freq: Four times a day (QID) | ORAL | Status: DC | PRN

## 2023-02-17 MED ORDER — ACETAMINOPHEN 650 MG RE SUPP: 650.0000 mg | Freq: Four times a day (QID) | RECTAL | Status: DC | PRN

## 2023-02-17 MED ORDER — HYDRALAZINE HCL 20 MG/ML IJ SOLN
10.0000 mg | INTRAMUSCULAR | Status: DC | PRN
  Administered 2023-02-17 – 2023-02-19 (×2): 10 mg via INTRAVENOUS
  Filled 2023-02-17 (×2): qty 1

## 2023-02-17 MED ORDER — METRONIDAZOLE 500 MG/100ML IV SOLN
500.0000 mg | Freq: Two times a day (BID) | INTRAVENOUS | Status: DC
  Administered 2023-02-18 (×2): 500 mg via INTRAVENOUS
  Filled 2023-02-17 (×2): qty 100

## 2023-02-17 MED ORDER — TAMSULOSIN HCL 0.4 MG PO CAPS
0.4000 mg | ORAL_CAPSULE | Freq: Every day | ORAL | Status: DC
  Filled 2023-02-17: qty 1

## 2023-02-17 MED ORDER — GABAPENTIN 250 MG/5ML PO SOLN
600.0000 mg | Freq: Every day | ORAL | Status: DC
  Administered 2023-02-17 – 2023-02-20 (×4): 600 mg
  Filled 2023-02-17 (×4): qty 12

## 2023-02-17 MED ORDER — PRAVASTATIN SODIUM 20 MG PO TABS: 40.0000 mg | ORAL_TABLET | Freq: Every evening | ORAL | Status: DC

## 2023-02-17 MED ORDER — OXYCODONE HCL 5 MG PO TABS: 5.0000 mg | ORAL_TABLET | ORAL | Status: DC | PRN

## 2023-02-17 MED ORDER — MORPHINE SULFATE (PF) 2 MG/ML IV SOLN
2.0000 mg | INTRAVENOUS | Status: DC | PRN
  Administered 2023-02-17: 2 mg via INTRAVENOUS
  Filled 2023-02-17: qty 1

## 2023-02-17 MED ORDER — CIPROFLOXACIN IN D5W 400 MG/200ML IV SOLN
400.0000 mg | Freq: Two times a day (BID) | INTRAVENOUS | Status: DC
  Administered 2023-02-17 – 2023-02-18 (×2): 400 mg via INTRAVENOUS
  Filled 2023-02-17 (×2): qty 200

## 2023-02-17 NOTE — H&P (Signed)
History and Physical    Patient: Cory Bright [id number] DOB: 06/03/1936 DOA: 02/17/2023 DOS: the patient was seen and examined on 02/17/2023 PCP: Velna Hatchet, MD  Patient coming from: Home - lives with wife; NOK: Wife, Graysyn Freedland, [phone]   Chief Complaint: Abdominal pain  HPI: Cory Bright is a 87 y.o. male with medical history significant of back pain, prior colon cancer, and HTN presenting with abdominal pain.  He was last admitted from 3/2-6 with colitis, treated with Cipro/Flagyl.   He was discharged on 3/6, felt good with no pain until 3/9.  He awoke yesterday AM and he had a "wonderful omelet".  He felt fine and only ate half to avoid overeating.  About 2pm, he was walking his dogs and was about 1/2 mile from home and he started with acute severe abdominal pain.  He managed to make it home and the pain lessened some.  He ate a veggie burger about 5pm, "that was a big mistake."  It worsened again after the [facility] game about 9pm.  He had diarrhea a few times during the day and again last night about 10pm.  It was so intense that he broke out in cold sweat and couldn't stand.  He had a terrible night with pain off and on throughout the night.  Severe pain when lying on his L side, could only sleep on his back.  They called his PCP, who sent them to urgent care and then they sent him to the ER.  His wife reports that he didn't bounce back as quickly as expected (although he was improving) so she isn't really surprised about his need for repeat admission.    ER Course:  Concern for SBO.  NG tube placed.  Surgery will see.  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       Review of Systems: As mentioned in the history of present illness. All other systems reviewed and are negative. Past Medical History:  Diagnosis Date   Anemia    Arthritis    Back pain    Cancer ([place])    colon   Hypertension    Past Surgical History:  Procedure Laterality Date    COLON SURGERY     colectomy, R   COLONOSCOPY WITH PROPOFOL N/A 04/25/2015   Procedure: COLONOSCOPY WITH PROPOFOL;  Surgeon: Garlan Fair, MD;  Location: [facility];  Service: Endoscopy;  Laterality: N/A;   ESOPHAGOGASTRODUODENOSCOPY (EGD) WITH PROPOFOL N/A 10/02/2016   Procedure: ESOPHAGOGASTRODUODENOSCOPY (EGD) WITH PROPOFOL;  Surgeon: Garlan Fair, MD;  Location: [facility];  Service: Endoscopy;  Laterality: N/A;   EYE SURGERY     bilateral cataracts with lens implants   SPINE SURGERY     Social History:  reports that he has never smoked. He has never used smokeless tobacco. He reports current alcohol use. He reports that he does not use drugs.  Allergies  Allergen Reactions   Tramadol Itching    No family history on file.  Prior to Admission medications   Medication Sig Start Date End Date Taking? Authorizing Provider  ciprofloxacin (CIPRO) 500 MG tablet Take 1 tablet (500 mg total) by mouth 2 (two) times daily for 4 days. 02/13/23 02/17/23  Karmen Bongo, MD  fluticasone (CUTIVATE) 0.05 % cream Apply 1 Application topically daily as needed (itching).    [provider]  gabapentin (NEURONTIN) 300 MG capsule TAKE ONE CAPSULE BY MOUTH EVERY MORNING AND TAKE TWO CAPSULES BY MOUTH EVERY NIGHT  AT BEDTIME Patient taking differently: Take 300-600 mg by mouth See admin instructions. Takes 300 mg in the morning and 600 mg at night. 11/05/16   Gerda Diss, DO  lisinopril (PRINIVIL,ZESTRIL) 20 MG tablet Take 20 mg by mouth daily.    [provider]  metroNIDAZOLE (FLAGYL) 500 MG tablet Take 1 tablet (500 mg total) by mouth 3 (three) times daily for 4 days. 02/13/23 02/17/23  Karmen Bongo, MD  omeprazole (PRILOSEC) 20 MG capsule Take 20 mg by mouth daily.    [provider]  pravastatin (PRAVACHOL) 40 MG tablet Take 40 mg by mouth every evening.    [provider]  tamsulosin (FLOMAX) 0.4 MG CAPS capsule Take 0.4 mg by mouth daily.    [provider]  Wheat Dextrin (BENEFIBER HEALTHY SHAPE) POWD Take 2 Capfuls by mouth daily.    [provider]    Physical Exam: Vitals:   02/17/23 1430 02/17/23 1500 02/17/23 1516 02/17/23 1546  BP: (!) 144/76 (!) 160/78  (!) 177/68  Pulse: 77 79  79  Resp:    18  Temp:   98.4 F (36.9 C) 98.7 F (37.1 C)  TempSrc:   Oral Oral  SpO2: 98% 97%  97%  Weight:      Height:       General:  Appears calm and comfortable and is in NAD Eyes:  EOMI, normal lids, iris ENT:  grossly normal hearing, lips & tongue, mmm; NG tube in place Neck:  no LAD, masses or thyromegaly Cardiovascular:  RRR, no m/r/g. No LE edema.  Respiratory:   CTA bilaterally with no wheezes/rales/rhonchi.  Normal respiratory effort. Abdomen:  distended, somewhat tympanic, generalized mild TTP Skin:  no rash or induration seen on limited exam Musculoskeletal:  grossly normal tone BUE/BLE, good ROM, no bony abnormality Psychiatric: blunted mood and affect, speech fluent and appropriate, AOx3 Neurologic:  CN 2-12 grossly intact, moves all extremities in coordinated fashion   Radiological Exams on Admission: Independently reviewed - see discussion in A/P where applicable  DG Abdomen 1 View  Result Date: 02/17/2023 CLINICAL DATA:  NG tube placement. EXAM: ABDOMEN - 1 VIEW COMPARISON:  02/11/2023 FINDINGS: NG tube tip is in the stomach with proximal side port in the region of the GE junction. Gaseous small bowel distension noted left upper quadrant. IMPRESSION: NG tube tip is in the proximal to mid stomach with side port of the NG tube in the region of the GE junction. Tube could be advanced 3-4 cm to ensure proximal side port placement below the GE junction as clinically warranted. Electronically Signed   By: Misty Stanley M.D.   On: 02/17/2023 15:13   CT Angio Abd/Pel W and/or Wo Contrast  Result Date: 02/17/2023 CLINICAL DATA:  Left upper quadrant pain.  Mesenteric ischemia. EXAM: CTA ABDOMEN AND PELVIS WITHOUT  AND WITH CONTRAST TECHNIQUE: Multidetector CT imaging of the abdomen and pelvis was performed using the standard protocol during bolus administration of intravenous contrast. Multiplanar reconstructed images and MIPs were obtained and reviewed to evaluate the vascular anatomy. RADIATION DOSE REDUCTION: This exam was performed according to the departmental dose-optimization program which includes automated exposure control, adjustment of the mA and/or kV according to patient size and/or use of iterative reconstruction technique. CONTRAST:  113m OMNIPAQUE IOHEXOL 350 MG/ML SOLN COMPARISON:  02/10/2023 FINDINGS: VASCULAR Aorta: Normal caliber aorta without aneurysm, dissection, vasculitis or significant stenosis. 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 Appearance is  not characteristic for median arcuate ligament mass-effect although no appreciable atherosclerotic plaque is seen at this level. Celiac lumen is well opacified distal to the stenosis with normal opacification of the common hepatic artery and splenic artery. SMA: Patent without evidence of aneurysm, dissection, vasculitis or significant stenosis. Renals: Both renal arteries are patent without evidence of aneurysm, dissection, vasculitis, fibromuscular dysplasia or significant stenosis. IMA: Patent without evidence of aneurysm, dissection, vasculitis or significant stenosis. Inflow: Patent without evidence of aneurysm, dissection, vasculitis or significant stenosis. Proximal Outflow: Bilateral common femoral and visualized portions of the superficial and profunda femoral arteries are patent without evidence of aneurysm, dissection, vasculitis or significant stenosis. Veins: Portal vein and superior mesenteric vein are patent. No portal venous gas. No obvious venous abnormality within the limitations of this arterial phase study. Review of the MIP images confirms the above findings. NON-VASCULAR  Lower chest: Tiny bilateral pleural effusions with dependent atelectasis. The heart is enlarged. Hepatobiliary: No suspicious focal abnormality within the liver parenchyma. There is no evidence for gallstones, gallbladder wall thickening, or pericholecystic fluid. No intrahepatic or extrahepatic biliary dilation. Pancreas: No focal mass lesion. No dilatation of the main duct. No intraparenchymal cyst. No peripancreatic edema. Spleen: No splenomegaly. No focal mass lesion. Adrenals/Urinary Tract: No adrenal nodule or mass. Small cyst noted both kidneys. No followup imaging is recommended. 1.8 x 1.9 cm enhancing lesion identified medial interpolar left kidney (image 80/5) this cannot definitely be seen to arise from the cortex although renal cell carcinoma is a concern. Transitional cell/urothelial neoplasm also a consideration. No evidence for hydroureter. The urinary bladder appears normal for the degree of distention. Midline posterior bladder diverticulum evident. Stomach/Bowel: Stomach is distended with fluid. Duodenum is normally positioned as is the ligament of Treitz. Small bowel loops in the abdomen and pelvis are dilated up to 3.6 cm diameter. Small bowel in the right pelvis shows fecalization of enteric contents suggesting decreased transit. This is just proximal to what appears to be a enterocolic anastomosis (image 122/5). An apparent blind-ending small bowel structure with associated staple line again noted and 3.8 x 2.2 cm enterolith/fecalith (image 127/5). This is filled with gas and debris in the lumen with ill-defined wall and adjacent edema/inflammation, similar to prior study. No small bowel pneumatosis or substantial small bowel wall thickening. No definite areas of small-bowel wall non enhancement. No gross colonic mass. No colonic wall thickening. Diverticular changes noted left colon without diverticulitis. Lymphatic: A collar of soft tissue attenuation in cases the infrarenal abdominal aorta  and tracks down along the right common iliac artery. No pelvic sidewall lymphadenopathy. Reproductive: The prostate gland and seminal vesicles are unremarkable. Other: Vascular congestion with interloop mesenteric fluid noted in the small bowel mesentery subtending the dilated small bowel loops. Small volume free fluid noted around the liver and spleen with small volume free fluid in the pelvis. Musculoskeletal: Left groin hernia contains only fat. No worrisome lytic or sclerotic osseous abnormality. Lumbosacral fusion hardware evident. IMPRESSION: 1. 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 Appearance is not characteristic for median arcuate ligament mass-effect although no appreciable atherosclerotic plaque is seen at this level. Celiac lumen is well opacified distal to the stenosis with normal opacification of the common hepatic artery and splenic artery. 2. A collar of soft tissue attenuation in cases the infrarenal abdominal aorta and tracks down along the right common iliac artery. Imaging features could reflect vasculitis or retroperitoneal fibrosis. 3. Interval progression of small bowel dilatation since 02/10/2023. Dilated small  bowel in the right pelvis demonstrates fecalization of enteric contents suggesting decreased transit and small bowel obstruction. This dilated abnormal appearing loop of small bowel appears to track into the ileocolic anastomosis. 4. Vascular congestion with interloop mesenteric fluid seen in small bowel mesentery of the abdomen and pelvis. There is no overt small bowel wall thickening and no discernible small bowel pneumatosis to suggest overt small bowel ischemia on this study. No portal venous gas. 5. Apparent blind-ending small bowel structure with associated staple line and 3.8 x 2.2 cm enterolith/fecalith. This may represent a blind-ending stump related to anastomosis given the extensive bowel surgery noted in the right lower quadrant. This  appearance has been present on studies dating back to 10/05/2016 although ill-defined wall thickening is new since prior studies. Infectious/inflammatory enteritis would be a consideration. Ischemia considered less likely but not excluded. 6. 1.8 x 1.9 cm enhancing lesion medial interpolar left kidney cannot definitely be seen to arise from the cortex although renal cell carcinoma is a consideration. Transitional cell/urothelial neoplasm also a consideration. MRI of the abdomen with and without contrast recommended to further evaluate. 7. Tiny bilateral pleural effusions with dependent atelectasis. 8. Left groin hernia contains only fat. Electronically Signed   By: Misty Stanley M.D.   On: 02/17/2023 13:58    EKG: not done   Labs on Admission: I have personally reviewed the available labs and imaging studies at the time of the admission.  Pertinent labs:    Glucose 137 BUN 15/Creatinine 1.37/GFR 51; normal on 3/5 Lactate 1.3 WBC 10.6 Hgb 10.7 Platelets 408 COVID/flu/RSV negative   Assessment and Plan: Principal Problem:   SBO (small bowel obstruction) (HCC) Active Problems:   HTN (hypertension)   HLD (hyperlipidemia)   BPH (benign prostatic hyperplasia)   Renal lesion   DNR (do not resuscitate)    Assessment and Plan: No notes have been filed under this hospital service. Service: Hospitalist  SBO -Patient with prior h/o SBO and extensive bowel surgery -He was admitted last week with what was thought to be colitis and he seemed to be improving -However, symptoms recurred yesterday AM with severe abdominal pain -Imaging with flow-limiting stenosis at the celiac origin but with normal flow distal to this so uncertain how clinically significant this is -No apparent ischemia on imaging and negative lactate so ischemic colitis seems less likely as the cause -CT also shows worsening SB dilatation, apparent SBO tracking to the ileocolic anastomosis with ?blind-ending stump, fecalith,  and wall thickening -Will admit to Med Surg -Gen Surg consulted  -NPO for bowel rest -NG tube in place -IVF hydration -Pain control with morphine -Completed Cipro and Flagyl today  Renal lesion -1.8 x 1.9 cm  -Needs MRI abdomen with/without contrast for further evaluation once his acute issues have improved   BPH (benign prostatic hyperplasia) -Continue tamsulosin   HLD (hyperlipidemia) -Continue pravastatin   HTN (hypertension) -Lisinopril held for now -Likely to need once his BPs trend back up (as with last hospitalization)  DNR -I have discussed code status with the patient and he would not desire resuscitation and would prefer to die a natural death should that situation arise.    Advance Care Planning:   Code Status: DNR   Consults: Surgery  DVT Prophylaxis: SCDs  Family Communication: None present; I spoke with his wife by telephone at the time of admission  Severity of Illness: The appropriate patient status for this patient is INPATIENT. Inpatient status is judged to be reasonable and necessary in order  to provide the required intensity of service to ensure the patient's safety. The patient's presenting symptoms, physical exam findings, and initial radiographic and laboratory data in the context of their chronic comorbidities is felt to place them at high risk for further clinical deterioration. Furthermore, it is not anticipated that the patient will be medically stable for discharge from the hospital within 2 midnights of admission.   * I certify that at the point of admission it is my clinical judgment that the patient will require inpatient hospital care spanning beyond 2 midnights from the point of admission due to high intensity of service, high risk for further deterioration and high frequency of surveillance required.*  Author: Karmen Bongo, MD 02/17/2023 5:38 PM  For on call review [email or web address].

## 2023-02-17 NOTE — ED Provider Notes (Signed)
[facility] Provider Note   CSN: [id number] Arrival date & time: 02/17/23  1107     History  Chief Complaint  Patient presents with   Abdominal Pain    Cory Bright is a 87 y.o. male history significant for anemia, color cancer, hypertension.  Patient presented with his wife today for evaluation abdominal pain and distention onset last night, he reports pain has been mild and intermittent, no clear aggravating or alleviating factors, no radiation of pain he was noted the pain was mostly on the left side earlier.  He is pain-free at this time.  He reports symptoms are associated with some abdominal swelling/distention that is new for him.  He denies any nausea or vomiting, he reports last bowel movement was just prior to arrival and was watery.  Patient was admitted to the hospital for colitis last week, he reports that he had felt well on discharge and then symptoms began last night.  He denies any fever/chills, chest pain/shortness of breath, vomiting or any additional concerns.  HPI     Home Medications Prior to Admission medications   Medication Sig Start Date End Date Taking? Authorizing Provider  ciprofloxacin (CIPRO) 500 MG tablet Take 1 tablet (500 mg total) by mouth 2 (two) times daily for 4 days. 02/13/23 02/17/23  Karmen Bongo, MD  fluticasone (CUTIVATE) 0.05 % cream Apply 1 Application topically daily as needed (itching).    [provider]  gabapentin (NEURONTIN) 300 MG capsule TAKE ONE CAPSULE BY MOUTH EVERY MORNING AND TAKE TWO CAPSULES BY MOUTH EVERY NIGHT AT BEDTIME Patient taking differently: Take 300-600 mg by mouth See admin instructions. Takes 300 mg in the morning and 600 mg at night. 11/05/16   Gerda Diss, DO  lisinopril (PRINIVIL,ZESTRIL) 20 MG tablet Take 20 mg by mouth daily.    [provider]  metroNIDAZOLE (FLAGYL) 500 MG tablet Take 1 tablet (500 mg total) by mouth 3 (three) times  daily for 4 days. 02/13/23 02/17/23  Karmen Bongo, MD  omeprazole (PRILOSEC) 20 MG capsule Take 20 mg by mouth daily.    [provider]  pravastatin (PRAVACHOL) 40 MG tablet Take 40 mg by mouth every evening.    [provider]  tamsulosin (FLOMAX) 0.4 MG CAPS capsule Take 0.4 mg by mouth daily.    [provider]  Wheat Dextrin (BENEFIBER HEALTHY SHAPE) POWD Take 2 Capfuls by mouth daily.    [provider]      Allergies    Tramadol    Review of Systems   Review of Systems Ten systems are reviewed and are negative for acute change except as noted in the HPI  Physical Exam Updated Vital Signs BP (!) 160/78   Pulse 79   Temp 98.4 F (36.9 C) (Oral)   Resp 18   Ht '5\' 8"'$  (1.727 m)   Wt 65 kg   SpO2 97%   BMI 21.79 kg/m  Physical Exam Constitutional:      General: He is not in acute distress.    Appearance: Normal appearance. He is well-developed. He is not ill-appearing or diaphoretic.  HENT:     Head: Normocephalic and atraumatic.  Eyes:     General: Vision grossly intact. Gaze aligned appropriately.     Pupils: Pupils are equal, round, and reactive to light.  Neck:     Trachea: Trachea and phonation normal.  Pulmonary:     Effort: Pulmonary effort is normal. No respiratory  distress.  Abdominal:     General: There is distension (Mildly distended).     Palpations: Abdomen is soft.     Tenderness: There is abdominal tenderness (Mild tenderness of the left abdomen). There is no guarding or rebound.  Musculoskeletal:        General: Normal range of motion.     Cervical back: Normal range of motion.  Skin:    General: Skin is warm and dry.  Neurological:     Mental Status: He is alert.     GCS: GCS eye subscore is 4. GCS verbal subscore is 5. GCS motor subscore is 6.     Comments: Speech is clear and goal oriented, follows commands Major Cranial nerves without deficit, no facial droop Moves extremities without ataxia, coordination  intact  Psychiatric:        Behavior: Behavior normal.     ED Results / Procedures / Treatments   Labs (all labs ordered are listed, but only abnormal results are displayed) Labs Reviewed  CBC WITH DIFFERENTIAL/PLATELET - Abnormal; Notable for the following components:      Result Value   WBC 10.6 (*)    RBC 3.60 (*)    Hemoglobin 10.7 (*)    HCT 33.2 (*)    Platelets 408 (*)    Neutro Abs 7.8 (*)    Abs Immature Granulocytes 0.09 (*)    All other components within normal limits  COMPREHENSIVE METABOLIC PANEL - Abnormal; Notable for the following components:   Glucose, Bld 137 (*)    Creatinine, Ser 1.37 (*)    Calcium 8.7 (*)    Albumin 3.4 (*)    GFR, Estimated 51 (*)    All other components within normal limits  RESP PANEL BY RT-PCR (RSV, FLU A&B, COVID)  RVPGX2  LIPASE, BLOOD  LACTIC ACID, PLASMA  URINALYSIS, ROUTINE W REFLEX MICROSCOPIC  LACTIC ACID, PLASMA    EKG None  Radiology DG Abdomen 1 View  Result Date: 02/17/2023 CLINICAL DATA:  NG tube placement. EXAM: ABDOMEN - 1 VIEW COMPARISON:  02/11/2023 FINDINGS: NG tube tip is in the stomach with proximal side port in the region of the GE junction. Gaseous small bowel distension noted left upper quadrant. IMPRESSION: NG tube tip is in the proximal to mid stomach with side port of the NG tube in the region of the GE junction. Tube could be advanced 3-4 cm to ensure proximal side port placement below the GE junction as clinically warranted. Electronically Signed   By: Misty Stanley M.D.   On: 02/17/2023 15:13   CT Angio Abd/Pel W and/or Wo Contrast  Result Date: 02/17/2023 CLINICAL DATA:  Left upper quadrant pain.  Mesenteric ischemia. EXAM: CTA ABDOMEN AND PELVIS WITHOUT AND WITH CONTRAST TECHNIQUE: Multidetector CT imaging of the abdomen and pelvis was performed using the standard protocol during bolus administration of intravenous contrast. Multiplanar reconstructed images and MIPs were obtained and reviewed to  evaluate the vascular anatomy. RADIATION DOSE REDUCTION: This exam was performed according to the departmental dose-optimization program which includes automated exposure control, adjustment of the mA and/or kV according to patient size and/or use of iterative reconstruction technique. CONTRAST:  170m OMNIPAQUE IOHEXOL 350 MG/ML SOLN COMPARISON:  02/10/2023 FINDINGS: VASCULAR Aorta: Normal caliber aorta without aneurysm, dissection, vasculitis or significant stenosis. 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 Appearance is not characteristic for median arcuate ligament mass-effect although no appreciable atherosclerotic plaque is seen at this level. Celiac lumen  is well opacified distal to the stenosis with normal opacification of the common hepatic artery and splenic artery. SMA: Patent without evidence of aneurysm, dissection, vasculitis or significant stenosis. Renals: Both renal arteries are patent without evidence of aneurysm, dissection, vasculitis, fibromuscular dysplasia or significant stenosis. IMA: Patent without evidence of aneurysm, dissection, vasculitis or significant stenosis. Inflow: Patent without evidence of aneurysm, dissection, vasculitis or significant stenosis. Proximal Outflow: Bilateral common femoral and visualized portions of the superficial and profunda femoral arteries are patent without evidence of aneurysm, dissection, vasculitis or significant stenosis. Veins: Portal vein and superior mesenteric vein are patent. No portal venous gas. No obvious venous abnormality within the limitations of this arterial phase study. Review of the MIP images confirms the above findings. NON-VASCULAR Lower chest: Tiny bilateral pleural effusions with dependent atelectasis. The heart is enlarged. Hepatobiliary: No suspicious focal abnormality within the liver parenchyma. There is no evidence for gallstones, gallbladder wall thickening, or  pericholecystic fluid. No intrahepatic or extrahepatic biliary dilation. Pancreas: No focal mass lesion. No dilatation of the main duct. No intraparenchymal cyst. No peripancreatic edema. Spleen: No splenomegaly. No focal mass lesion. Adrenals/Urinary Tract: No adrenal nodule or mass. Small cyst noted both kidneys. No followup imaging is recommended. 1.8 x 1.9 cm enhancing lesion identified medial interpolar left kidney (image 80/5) this cannot definitely be seen to arise from the cortex although renal cell carcinoma is a concern. Transitional cell/urothelial neoplasm also a consideration. No evidence for hydroureter. The urinary bladder appears normal for the degree of distention. Midline posterior bladder diverticulum evident. Stomach/Bowel: Stomach is distended with fluid. Duodenum is normally positioned as is the ligament of Treitz. Small bowel loops in the abdomen and pelvis are dilated up to 3.6 cm diameter. Small bowel in the right pelvis shows fecalization of enteric contents suggesting decreased transit. This is just proximal to what appears to be a enterocolic anastomosis (image 122/5). An apparent blind-ending small bowel structure with associated staple line again noted and 3.8 x 2.2 cm enterolith/fecalith (image 127/5). This is filled with gas and debris in the lumen with ill-defined wall and adjacent edema/inflammation, similar to prior study. No small bowel pneumatosis or substantial small bowel wall thickening. No definite areas of small-bowel wall non enhancement. No gross colonic mass. No colonic wall thickening. Diverticular changes noted left colon without diverticulitis. Lymphatic: A collar of soft tissue attenuation in cases the infrarenal abdominal aorta and tracks down along the right common iliac artery. No pelvic sidewall lymphadenopathy. Reproductive: The prostate gland and seminal vesicles are unremarkable. Other: Vascular congestion with interloop mesenteric fluid noted in the small  bowel mesentery subtending the dilated small bowel loops. Small volume free fluid noted around the liver and spleen with small volume free fluid in the pelvis. Musculoskeletal: Left groin hernia contains only fat. No worrisome lytic or sclerotic osseous abnormality. Lumbosacral fusion hardware evident. IMPRESSION: 1. 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 Appearance is not characteristic for median arcuate ligament mass-effect although no appreciable atherosclerotic plaque is seen at this level. Celiac lumen is well opacified distal to the stenosis with normal opacification of the common hepatic artery and splenic artery. 2. A collar of soft tissue attenuation in cases the infrarenal abdominal aorta and tracks down along the right common iliac artery. Imaging features could reflect vasculitis or retroperitoneal fibrosis. 3. Interval progression of small bowel dilatation since 02/10/2023. Dilated small bowel in the right pelvis demonstrates fecalization of enteric contents suggesting decreased transit and small bowel obstruction. This dilated  abnormal appearing loop of small bowel appears to track into the ileocolic anastomosis. 4. Vascular congestion with interloop mesenteric fluid seen in small bowel mesentery of the abdomen and pelvis. There is no overt small bowel wall thickening and no discernible small bowel pneumatosis to suggest overt small bowel ischemia on this study. No portal venous gas. 5. Apparent blind-ending small bowel structure with associated staple line and 3.8 x 2.2 cm enterolith/fecalith. This may represent a blind-ending stump related to anastomosis given the extensive bowel surgery noted in the right lower quadrant. This appearance has been present on studies dating back to 10/05/2016 although ill-defined wall thickening is new since prior studies. Infectious/inflammatory enteritis would be a consideration. Ischemia considered less likely but not excluded. 6.  1.8 x 1.9 cm enhancing lesion medial interpolar left kidney cannot definitely be seen to arise from the cortex although renal cell carcinoma is a consideration. Transitional cell/urothelial neoplasm also a consideration. MRI of the abdomen with and without contrast recommended to further evaluate. 7. Tiny bilateral pleural effusions with dependent atelectasis. 8. Left groin hernia contains only fat. Electronically Signed   By: Misty Stanley M.D.   On: 02/17/2023 13:58    Procedures Procedures    Medications Ordered in ED Medications  sodium chloride 0.9 % bolus 500 mL (0 mLs Intravenous Stopped 02/17/23 1436)  iohexol (OMNIPAQUE) 350 MG/ML injection 100 mL (100 mLs Intravenous Contrast Given 02/17/23 1306)  lidocaine (XYLOCAINE) 2 % viscous mouth solution 15 mL (15 mLs Mouth/Throat Given 02/17/23 1453)    ED Course/ Medical Decision Making/ A&P Clinical Course as of 02/17/23 1525  Sun Feb 17, 2023  1428 Dr. Redmond Pulling [BM]  1455 Dr. Lorin Mercy [BM]    Clinical Course User Index [BM] Deliah Boston, PA-C                             Medical Decision Making 17 old male history as above presented for left-sided abdominal pain and abdominal distention onset last night.  No vomiting.  Last bowel movement prior to arrival was watery.  No fever or additional concerns.  On evaluation patient is well-appearing and in no acute distress, his abdomen is mildly distended he is mildly tender to the left abdomen.  Will obtain the labs along with CT imaging.  Chart reviewed patient was recently admitted for colitis, he was discharged 4 days ago.  Differential includes but not limited to SBO, perforation, diverticulitis, gastritis, ischemic colitis.  Amount and/or Complexity of Data Reviewed External Data Reviewed: notes.    Details: Reviewed discharge summary from patient's hospitalization 3/2 - 02/13/2023.  Principal problem was colitis.  Patient met SIRS criteria on ER presentation with leukocytosis and fever.   Suspected source GI infection colitis versus small bowel enteritis.  Blood cultures were negative he was treated with IV cefepime and Flagyl and then transition to p.o. antibiotics Flagyl and Cipro.  C. difficile was negative.  Stool studies were negative. Labs: ordered.    Details: COVID/influenza panel and RSV negative Lipase within normal limits, doubt pancreatitis Lactic 1.3, doubt ischemic colitis CMP shows baseline creatinine of 1.37.  No emergent electrolyte derangement, AKI, LFT elevations or gap. CBC shows mild anemia of 10.7 and mild leukocytosis 10.6 similar to prior.  No thrombocytopenia, platelets 408. Urinalysis pending Radiology: ordered.    Details: I reviewed and interpreted patient CT abdomen pelvis.  I agree with radiologist that patient appears to have an SBO.  Please see radiology  interpretation for more details.  I have also reviewed interpreted patient's 1 view abdomen x-ray.  Nasogastric tube appears to be within the stomach. Discussion of management or test interpretation with external provider(s): Consulted with general surgeon Dr. Redmond Pulling who advised placement of NG tube and he will be by to see patient later on today, request hospitalist admission. Consulted with hospitalist Dr. Lorin Mercy who accepted patient for admission.  Risk Prescription drug management. Decision regarding hospitalization.   Patient was reassessed he is resting comfortably in no acute distress declines need for pain medication.  Updated patient on concerns above he stated understanding.  Patient is agreeable to placement of NG tube and admission to hospital.  All questions were answered.  Patient's case discussed w/ Dr. Mayra Neer today who agrees with workup and admission.  Note: Portions of this report may have been transcribed using voice recognition software. Every effort was made to ensure accuracy; however, inadvertent computerized transcription errors may still be present.         Final  Clinical Impression(s) / ED Diagnoses Final diagnoses:  SBO (small bowel obstruction) [facility])    Rx / DC Orders ED Discharge Orders     None         Deliah Boston, PA-C 02/17/23 1526    Audley Hose, MD 02/18/23 [phone]

## 2023-02-17 NOTE — ED Notes (Signed)
ED TO INPATIENT HANDOFF REPORT  ED Nurse Name and Phone #: Hebert Soho, RN [phone]  S Name/Age/Gender Cory Bright 87 y.o. male Room/Bed: WA06/WA06  Code Status   Code Status: Prior  Home/SNF/Other Home Patient oriented to: self, place, time, and situation Is this baseline? Yes   Triage Complete: Triage complete  Chief Complaint SBO (small bowel obstruction) ([place]) [K56.609]  Triage Note Pt c/o upper abd pain states was dc from hospital Wed, was admitted with colitis.    Allergies Allergies  Allergen Reactions   Tramadol Itching    Level of Care/Admitting Diagnosis ED Disposition     ED Disposition  Admit   Condition  --   Comment  Hospital Area: [facility] [id number]  Level of Care: Med-Surg [16]  May admit patient to [facility] or [facility] if equivalent level of care is available:: Yes  Covid Evaluation: Confirmed COVID Negative  Diagn[facility]: [facility]small [facility] obstruction) [id number]  Admitting Physician: Karmen Bongo [[id number]]  Attending Physician: Karmen Bongo [id number]  Certification:: I certify this patient will need inpatient services for at least 2 midnights  Estimated Length of Stay: 4          B Medical/Surgery History Past Medical History:  Diagnosis Date   Anemia    Arthritis    Back pain    Cancer ([place])    colon   Hypertension    Past Surgical History:  Procedure Laterality Date   COLON SURGERY     colectomy, R   COLONOSCOPY WITH PROPOFOL N/A 04/25/2015   Procedure: COLONOSCOPY WITH PROPOFOL;  Surgeon: Garlan Fair, MD;  [facility];  Service: Endoscopy;  Laterality: N/A;   ESOPHAGOGASTRODUODENOSCOPY (EGD) WITH PROPOFOL N/A 10/02/2016   Procedure: ESOPHAGOGASTRODUODENOSCOPY (EGD) WITH PROPOFOL;  Surgeon: Garlan Fair, MD;  [facility];  Service: Endoscopy;  Laterality: N/A;   EYE SURGERY     bilateral cataracts with lens implants   SPINE SURGERY       A IV  Location/Drains/Wounds Patient Lines/Drains/Airways Status     Active Line/Drains/Airways     Name Placement date Placement time Site Days   Peripheral IV 02/17/23 20 G 1" Right Antecubital 02/17/23  1150  Antecubital  less than 1   NG/OG Vented/Dual Lumen 16 Fr. Right nare External length of tube 57 cm 02/17/23  1435  Right nare  less than 1            Intake/Output Last 24 hours  Intake/Output Summary (Last 24 hours) at 02/17/2023 1515 Last data filed at 02/17/2023 1436 Gross per 24 hour  Intake 500 ml  Output --  Net 500 ml    Labs/Imaging Results for orders placed or performed during the hospital encounter of 02/17/23 (from the past 48 hour(s))  CBC with Differential     Status: Abnormal   Collection Time: 02/17/23 11:46 AM  Result Value Ref Range   WBC 10.6 (H) 4.0 - 10.5 K/uL   RBC 3.60 (L) 4.22 - 5.81 MIL/uL   Hemoglobin 10.7 (L) 13.0 - 17.0 g/dL   HCT 33.2 (L) 39.0 - 52.0 %   MCV 92.2 80.0 - 100.0 fL   MCH 29.7 26.0 - 34.0 pg   MCHC 32.2 30.0 - 36.0 g/dL   RDW 13.5 11.5 - 15.5 %   Platelets 408 (H) 150 - 400 K/uL   nRBC 0.0 0.0 - 0.2 %   Neutrophils Relative % 74 %   Neutro Abs 7.8 (H) 1.7 - 7.7  K/uL   Lymphocytes Relative 14 %   Lymphs Abs 1.5 0.7 - 4.0 K/uL   Monocytes Relative 10 %   Monocytes Absolute 1.0 0.1 - 1.0 K/uL   Eosinophils Relative 1 %   Eosinophils Absolute 0.1 0.0 - 0.5 K/uL   Basophils Relative 0 %   Basophils Absolute 0.0 0.0 - 0.1 K/uL   Immature Granulocytes 1 %   Abs Immature Granulocytes 0.09 (H) 0.00 - 0.07 K/uL    Comment: Performed at [facility], [place] [address] [place], [place] [address]  Comprehensive metabolic panel     Status: Abnormal   Collection Time: 02/17/23 11:46 AM  Result Value Ref Range   Sodium 139 135 - 145 mmol/L   Potassium 4.1 3.5 - 5.1 mmol/L   Chloride 105 98 - 111 mmol/L   CO2 25 22 - 32 mmol/L   Glucose, Bld 137 (H) 70 - 99 mg/dL    Comment: Glucose reference range applies only to  samples taken after fasting for at least 8 hours.   BUN 15 8 - 23 mg/dL   Creatinine, Ser 1.37 (H) 0.61 - 1.24 mg/dL   Calcium 8.7 (L) 8.9 - 10.3 mg/dL   Total Protein 7.5 6.5 - 8.1 g/dL   Albumin 3.4 (L) 3.5 - 5.0 g/dL   AST 41 15 - 41 U/L   ALT 25 0 - 44 U/L   Alkaline Phosphatase 54 38 - 126 U/L   Total Bilirubin 0.8 0.3 - 1.2 mg/dL   GFR, Estimated 51 (L) >60 mL/min    Comment: (NOTE) Calculated using the CKD-EPI Creatinine Equation (2021)    Anion gap 9 5 - 15    Comment: Performed at [facility], [place] [address], [place], [place] [address]  Lipase, blood     Status: None   Collection Time: 02/17/23 11:46 AM  Result Value Ref Range   Lipase 32 11 - 51 U/L    Comment: Performed at [facility], [place] [address] [place], [place] [address]  Lactic acid, plasma     Status: None   Collection Time: 02/17/23 11:46 AM  Result Value Ref Range   Lactic Acid, Venous 1.3 0.5 - 1.9 mmol/L    Comment: Performed at [facility], [place] [address] [place], [place] [address]  Resp panel by RT-PCR (RSV, Flu A&B, Covid) Anterior Nasal Swab     Status: None   Collection Time: 02/17/23 11:46 AM   Specimen: Anterior Nasal Swab  Result Value Ref Range   SARS Coronavirus 2 by RT PCR NEGATIVE NEGATIVE    Comment: (NOTE) SARS-CoV-2 target nucleic acids are NOT DETECTED.  The SARS-CoV-2 RNA is generally detectable in upper respiratory specimens during the acute phase of infection. The lowest concentration of SARS-CoV-2 viral copies this assay can detect is 138 copies/mL. A negative result does not preclude SARS-Cov-2 infection and should not be used as the sole basis for treatment or other patient management decisions. A negative result may occur with  improper specimen collection/handling, submission of specimen other than nasopharyngeal swab, presence of viral mutation(s) within the areas targeted by this assay, and inadequate number of  viral copies(<138 copies/mL). A negative result must be combined with clinical observations, patient history, and epidemiological information. The expected result is Negative.  Fact Sheet for Patients:  EntrepreneurPulse.com.au  Fact Sheet for Healthcare Providers:  IncredibleEmployment.be  This test is no t yet approved or cleared by the [place] FDA and  has  been authorized for detection and/or diagnosis of SARS-CoV-2 by FDA under an Emergency Use Authorization (EUA). This EUA will remain  in effect (meaning this test can be used) for the duration of the COVID-19 declaration under Section 564(b)(1) of the Act, 21 U.S.C.section 360bbb-3(b)(1), unless the authorization is terminated  or revoked sooner.       Influenza A by PCR NEGATIVE NEGATIVE   Influenza B by PCR NEGATIVE NEGATIVE    Comment: (NOTE) The Xpert Xpress SARS-CoV-2/FLU/RSV plus assay is intended as an aid in the diagnosis of influenza from Nasopharyngeal swab specimens and should not be used as a sole basis for treatment. Nasal washings and aspirates are unacceptable for Xpert Xpress SARS-CoV-2/FLU/RSV testing.  Fact Sheet for Patients: EntrepreneurPulse.com.au  Fact Sheet for Healthcare Providers: IncredibleEmployment.be  This test is not yet approved or cleared by the [place] FDA and has been authorized for detection and/or diagnosis of SARS-CoV-2 by FDA under an Emergency Use Authorization (EUA). This EUA will remain in effect (meaning this test can be used) for the duration of the COVID-19 declaration under Section 564(b)(1) of the Act, 21 U.S.C. section 360bbb-3(b)(1), unless the authorization is terminated or revoked.     Resp Syncytial Virus by PCR NEGATIVE NEGATIVE    Comment: (NOTE) Fact Sheet for Patients: EntrepreneurPulse.com.au  Fact Sheet for Healthcare  Providers: IncredibleEmployment.be  This test is not yet approved or cleared by the [place] FDA and has been authorized for detection and/or diagnosis of SARS-CoV-2 by FDA under an Emergency Use Authorization (EUA). This EUA will remain in effect (meaning this test can be used) for the duration of the COVID-19 declaration under Section 564(b)(1) of the Act, 21 U.S.C. section 360bbb-3(b)(1), unless the authorization is terminated or revoked.  Performed at [facility], [place] [address], [place], [place] [address]    CT Angio Abd/Pel W and/or Wo Contrast  Result Date: 02/17/2023 CLINICAL DATA:  Left upper quadrant pain.  Mesenteric ischemia. EXAM: CTA ABDOMEN AND PELVIS WITHOUT AND WITH CONTRAST TECHNIQUE: Multidetector CT imaging of the abdomen and pelvis was performed using the standard protocol during bolus administration of intravenous contrast. Multiplanar reconstructed images and MIPs were obtained and reviewed to evaluate the vascular anatomy. RADIATION DOSE REDUCTION: This exam was performed according to the departmental dose-optimization program which includes automated exposure control, adjustment of the mA and/or kV according to patient size and/or use of iterative reconstruction technique. CONTRAST:  177m OMNIPAQUE IOHEXOL 350 MG/ML SOLN COMPARISON:  02/10/2023 FINDINGS: VASCULAR Aorta: Normal caliber aorta without aneurysm, dissection, vasculitis or significant stenosis. 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 Appearance is not characteristic for median arcuate ligament mass-effect although no appreciable atherosclerotic plaque is seen at this level. Celiac lumen is well opacified distal to the stenosis with normal opacification of the common hepatic artery and splenic artery. SMA: Patent without evidence of aneurysm, dissection, vasculitis or significant stenosis. Renals: Both renal  arteries are patent without evidence of aneurysm, dissection, vasculitis, fibromuscular dysplasia or significant stenosis. IMA: Patent without evidence of aneurysm, dissection, vasculitis or significant stenosis. Inflow: Patent without evidence of aneurysm, dissection, vasculitis or significant stenosis. Proximal Outflow: Bilateral common femoral and visualized portions of the superficial and profunda femoral arteries are patent without evidence of aneurysm, dissection, vasculitis or significant stenosis. Veins: Portal vein and superior mesenteric vein are patent. No portal venous gas. No obvious venous abnormality within the limitations of this arterial phase study. Review of the MIP images confirms  the above findings. NON-VASCULAR Lower chest: Tiny bilateral pleural effusions with dependent atelectasis. The heart is enlarged. Hepatobiliary: No suspicious focal abnormality within the liver parenchyma. There is no evidence for gallstones, gallbladder wall thickening, or pericholecystic fluid. No intrahepatic or extrahepatic biliary dilation. Pancreas: No focal mass lesion. No dilatation of the main duct. No intraparenchymal cyst. No peripancreatic edema. Spleen: No splenomegaly. No focal mass lesion. Adrenals/Urinary Tract: No adrenal nodule or mass. Small cyst noted both kidneys. No followup imaging is recommended. 1.8 x 1.9 cm enhancing lesion identified medial interpolar left kidney (image 80/5) this cannot definitely be seen to arise from the cortex although renal cell carcinoma is a concern. Transitional cell/urothelial neoplasm also a consideration. No evidence for hydroureter. The urinary bladder appears normal for the degree of distention. Midline posterior bladder diverticulum evident. Stomach/Bowel: Stomach is distended with fluid. Duodenum is normally positioned as is the ligament of Treitz. Small bowel loops in the abdomen and pelvis are dilated up to 3.6 cm diameter. Small bowel in the right pelvis  shows fecalization of enteric contents suggesting decreased transit. This is just proximal to what appears to be a enterocolic anastomosis (image 122/5). An apparent blind-ending small bowel structure with associated staple line again noted and 3.8 x 2.2 cm enterolith/fecalith (image 127/5). This is filled with gas and debris in the lumen with ill-defined wall and adjacent edema/inflammation, similar to prior study. No small bowel pneumatosis or substantial small bowel wall thickening. No definite areas of small-bowel wall non enhancement. No gross colonic mass. No colonic wall thickening. Diverticular changes noted left colon without diverticulitis. Lymphatic: A collar of soft tissue attenuation in cases the infrarenal abdominal aorta and tracks down along the right common iliac artery. No pelvic sidewall lymphadenopathy. Reproductive: The prostate gland and seminal vesicles are unremarkable. Other: Vascular congestion with interloop mesenteric fluid noted in the small bowel mesentery subtending the dilated small bowel loops. Small volume free fluid noted around the liver and spleen with small volume free fluid in the pelvis. Musculoskeletal: Left groin hernia contains only fat. No worrisome lytic or sclerotic osseous abnormality. Lumbosacral fusion hardware evident. IMPRESSION: 1. 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 Appearance is not characteristic for median arcuate ligament mass-effect although no appreciable atherosclerotic plaque is seen at this level. Celiac lumen is well opacified distal to the stenosis with normal opacification of the common hepatic artery and splenic artery. 2. A collar of soft tissue attenuation in cases the infrarenal abdominal aorta and tracks down along the right common iliac artery. Imaging features could reflect vasculitis or retroperitoneal fibrosis. 3. Interval progression of small bowel dilatation since 02/10/2023. Dilated small bowel in the  right pelvis demonstrates fecalization of enteric contents suggesting decreased transit and small bowel obstruction. This dilated abnormal appearing loop of small bowel appears to track into the ileocolic anastomosis. 4. Vascular congestion with interloop mesenteric fluid seen in small bowel mesentery of the abdomen and pelvis. There is no overt small bowel wall thickening and no discernible small bowel pneumatosis to suggest overt small bowel ischemia on this study. No portal venous gas. 5. Apparent blind-ending small bowel structure with associated staple line and 3.8 x 2.2 cm enterolith/fecalith. This may represent a blind-ending stump related to anastomosis given the extensive bowel surgery noted in the right lower quadrant. This appearance has been present on studies dating back to 10/05/2016 although ill-defined wall thickening is new since prior studies. Infectious/inflammatory enteritis would be a consideration. Ischemia considered less likely but not  excluded. 6. 1.8 x 1.9 cm enhancing lesion medial interpolar left kidney cannot definitely be seen to arise from the cortex although renal cell carcinoma is a consideration. Transitional cell/urothelial neoplasm also a consideration. MRI of the abdomen with and without contrast recommended to further evaluate. 7. Tiny bilateral pleural effusions with dependent atelectasis. 8. Left groin hernia contains only fat. Electronically Signed   By: Misty Stanley M.D.   On: 02/17/2023 13:58    Pending Labs Unresulted Labs (From admission, onward)     Start     Ordered   02/17/23 1146  Urinalysis, Routine w reflex microscopic -Urine, Clean Catch  (ED Abdominal Pain)  Once,   URGENT       Question:  Specimen Source  Answer:  Urine, Clean Catch   02/17/23 1145   02/17/23 1146  Lactic acid, plasma  Now then every 2 hours,   R (with STAT occurrences)      02/17/23 1145            Vitals/Pain Today's Vitals   02/17/23 1345 02/17/23 1400 02/17/23 1430  02/17/23 1500  BP: (!) 170/71 (!) 171/76 (!) 144/76 (!) 160/78  Pulse: 71 77 77 79  Resp:  18    Temp:      TempSrc:      SpO2: 98% 100% 98% 97%  Weight:      Height:      PainSc:        Isolation Precautions Airborne and Contact precautions  Medications Medications  sodium chloride 0.9 % bolus 500 mL (0 mLs Intravenous Stopped 02/17/23 1436)  iohexol (OMNIPAQUE) 350 MG/ML injection 100 mL (100 mLs Intravenous Contrast Given 02/17/23 1306)  lidocaine (XYLOCAINE) 2 % viscous mouth solution 15 mL (15 mLs Mouth/Throat Given 02/17/23 1453)    Mobility walks     Focused Assessments N/A   R Recommendations: See Admitting Provider Note  Report given to:   Additional Notes: N/A

## 2023-02-17 NOTE — ED Triage Notes (Signed)
Patient here today for abdominal pain and cold sweats X 1 day. He is also experiencing bloating and diarrhea. He was recent diagnosed with Colitis and prescribed Cipro and metronidazole. He received Thursday morning and taking his last dose today. He was in [place] a week ago and stayed for 4 nights. Persistent nausea and dry heaves today.

## 2023-02-17 NOTE — ED Provider Notes (Signed)
[place]    CSN: [id number] Arrival date & time: 02/17/23  1007      History   Chief Complaint Chief Complaint  Patient presents with   Abdominal Pain    HPI Cory Bright is a 87 y.o. male.    Abdominal Pain  Here for abdominal pain that began last night and intensified overnight.  It is generalized but more so bothering him on the left.  He is also experienced some bloating.  He has not actually thrown up any material but he has dry heaves a lot and has had some diarrhea and stool output, last 1 earlier today.  No fever or chills  Patient was admitted in March 2 and discharged March 6.  Initially he met the criteria for SIRS and was treated for colitis/enteritis.  He has been taking his Cipro and Flagyl since discharge.    Past Medical History:  Diagnosis Date   Anemia    Arthritis    Back pain    Cancer ([place])    colon   Hypertension     Patient Active Problem List   Diagnosis Date Noted   Abdominal pain 02/11/2023   Fever 02/10/2023   Hypotension 02/10/2023   Colitis 02/10/2023   HTN (hypertension) 02/10/2023   HLD (hyperlipidemia) 02/10/2023   BPH (benign prostatic hyperplasia) 02/10/2023   Small bowel obstruction ([place]) 07/22/2014   SBO (small bowel obstruction) ([place]) 07/22/2014    Past Surgical History:  Procedure Laterality Date   COLON SURGERY     colectomy, R   COLONOSCOPY WITH PROPOFOL N/A 04/25/2015   Procedure: COLONOSCOPY WITH PROPOFOL;  Surgeon: Garlan Fair, MD;  [facility];  Service: Endoscopy;  Laterality: N/A;   ESOPHAGOGASTRODUODENOSCOPY (EGD) WITH PROPOFOL N/A 10/02/2016   Procedure: ESOPHAGOGASTRODUODENOSCOPY (EGD) WITH PROPOFOL;  Surgeon: Garlan Fair, MD;  [facility];  Service: Endoscopy;  Laterality: N/A;   EYE SURGERY     bilateral cataracts with lens implants   SPINE SURGERY         Home Medications    Prior to Admission medications   Medication Sig Start Date End Date  Taking? Authorizing Provider  ciprofloxacin (CIPRO) 500 MG tablet Take 1 tablet (500 mg total) by mouth 2 (two) times daily for 4 days. 02/13/23 02/17/23 Yes Karmen Bongo, MD  gabapentin (NEURONTIN) 300 MG capsule TAKE ONE CAPSULE BY MOUTH EVERY MORNING AND TAKE TWO CAPSULES BY MOUTH EVERY NIGHT AT BEDTIME Patient taking differently: Take 300-600 mg by mouth See admin instructions. Takes 300 mg in the morning and 600 mg at night. 11/05/16  Yes Gerda Diss, DO  lisinopril (PRINIVIL,ZESTRIL) 20 MG tablet Take 20 mg by mouth daily.   Yes [provider]  metroNIDAZOLE (FLAGYL) 500 MG tablet Take 1 tablet (500 mg total) by mouth 3 (three) times daily for 4 days. 02/13/23 02/17/23 Yes Karmen Bongo, MD  omeprazole (PRILOSEC) 20 MG capsule Take 20 mg by mouth daily.   Yes [provider]  pravastatin (PRAVACHOL) 40 MG tablet Take 40 mg by mouth every evening.   Yes [provider]  tamsulosin (FLOMAX) 0.4 MG CAPS capsule Take 0.4 mg by mouth daily.   Yes [provider]  Wheat Dextrin (BENEFIBER HEALTHY SHAPE) POWD Take 2 Capfuls by mouth daily.   Yes [provider]  fluticasone (CUTIVATE) 0.05 % cream Apply 1 Application topically daily as needed (itching).    [provider]    Family History History reviewed. No pertinent  family history.  Social History Social History   Tobacco Use   Smoking status: Never   Smokeless tobacco: Never  Substance Use Topics   Alcohol use: Yes    Comment: wine daily   Drug use: No     Allergies   Tramadol   Review of Systems Review of Systems  Gastrointestinal:  Positive for abdominal pain.     Physical Exam Triage Vital Signs ED Triage Vitals  Enc Vitals Group     BP 02/17/23 1037 (!) 148/66     Pulse Rate 02/17/23 1037 82     Resp 02/17/23 1037 16     Temp 02/17/23 1037 98.5 F (36.9 C)     Temp Source 02/17/23 1037 Oral     SpO2 02/17/23 1037 95 %     Weight 02/17/23 1037 142 lb  (64.4 kg)     Height 02/17/23 1037 '5\' 8"'$  (1.727 m)     Head Circumference --      Peak Flow --      Pain Score 02/17/23 1036 7     Pain Loc --      Pain Edu? --      Excl. in [place]? --    No data found.  Updated Vital Signs BP (!) 148/66 (BP Location: Left Arm)   Pulse 82   Temp 98.5 F (36.9 C) (Oral)   Resp 16   Ht '5\' 8"'$  (1.727 m)   Wt 64.4 kg   SpO2 95%   BMI 21.59 kg/m   Visual Acuity Right Eye Distance:   Left Eye Distance:   Bilateral Distance:    Right Eye Near:   Left Eye Near:    Bilateral Near:     Physical Exam Vitals reviewed.  Constitutional:      General: He is not in acute distress.    Appearance: He is not ill-appearing, toxic-appearing or diaphoretic.  HENT:     Mouth/Throat:     Mouth: Mucous membranes are moist.  Eyes:     Extraocular Movements: Extraocular movements intact.     Pupils: Pupils are equal, round, and reactive to light.  Cardiovascular:     Rate and Rhythm: Normal rate and regular rhythm.     Heart sounds: No murmur heard. Pulmonary:     Effort: Pulmonary effort is normal.     Breath sounds: Normal breath sounds.  Abdominal:     General: There is distension (mildly distended, not tympanitic).     Tenderness: There is abdominal tenderness (generalized).  Musculoskeletal:     Cervical back: Neck supple.  Lymphadenopathy:     Cervical: No cervical adenopathy.  Skin:    Capillary Refill: Capillary refill takes less than 2 seconds.     Coloration: Skin is not jaundiced or pale.  Neurological:     General: No focal deficit present.     Mental Status: He is alert and oriented to person, place, and time.  Psychiatric:        Behavior: Behavior normal.      UC Treatments / Results  Labs (all labs ordered are listed, but only abnormal results are displayed) Labs Reviewed - No data to display  EKG   Radiology No results found.  Procedures Procedures (including critical care time)  Medications Ordered in  UC Medications - No data to display  Initial Impression / Assessment and Plan / UC Course  I have reviewed the triage vital signs and the nursing notes.  Pertinent labs & imaging results  that were available during my care of the patient were reviewed by me and considered in my medical decision making (see chart for details).        Vital signs are reassuring here, but with his symptom complex I am concerned that he needs urgent evaluation in the emergency room, especially with his bloating and dry heaves and recent hospitalization; he and his wife are going to proceed there by private car Final Clinical Impressions(s) / UC Diagnoses   Final diagnoses:  Generalized abdominal pain     Discharge Instructions      Please proceed to the ER     ED Prescriptions   None    PDMP not reviewed this encounter.   [place] Henle, MD 02/17/23 1057

## 2023-02-17 NOTE — ED Notes (Signed)
Patient is being discharged from the Urgent Care and sent to the Emergency Department via POV . Per Dr Windy Carina, patient is in need of higher level of care due to limited resources. Patient is aware and verbalizes understanding of plan of care.  Vitals:   02/17/23 1037  BP: (!) 148/66  Pulse: 82  Resp: 16  Temp: 98.5 F (36.9 C)  SpO2: 95%

## 2023-02-17 NOTE — Discharge Instructions (Signed)
Please proceed to the ER 

## 2023-02-17 NOTE — ED Triage Notes (Signed)
Pt c/o upper abd pain states was dc from hospital Wed, was admitted with colitis.

## 2023-02-17 NOTE — Consult Note (Signed)
CC: abdominal pain. bloating  Requesting provider: Dr Windy Carina  HPI: Cory Bright is an 87 y.o. male who is here for recurrent and worsening abdominal pain.  The patient has a remote history of right colectomy in the 90s at [facility] for colon cancer.  The patient states his symptoms actually started last Saturday when he had a high fever abdominal pain and was admitted from March 2 through the 6 with colitis.  He was having bowel movements during that time.  He was sent out on Cipro and Flagyl.  His abdominal pain had resolved prior to him being discharged from the hospital.  Did having recurrent abdominal pain yesterday afternoon.  He again had a meal late in the afternoon and his abdominal pain worsened around 9 PM.  He had ongoing pain intermittently throughout the night.  He called his PCP who sent him to urgent care which then directed him to the emergency room.  His bowel movements were loose but soft.  He states that he is continue to have bowel movements although loose.  His last bowel movement was earlier today which she describes as loose.  The wife states that his abdominal bloating never got better throughout last week's hospitalization.  He has had retching but no frank vomiting.  He was having lots of flatus while he was in the hospital earlier this week.  No melena or hematochezia.  The wife states that his bowel movements are dark green no frank blood.  He had a remote admission in 2015 with partial small bowel obstruction which was managed conservatively.  Has had intermittent intestinal issues over the years.  He had a CT enterography that suggested some chronic fecalith near his anastomosis in 20 17 which was shortly evaluated with a colonoscopy by Dr. Wynetta Emery which revealed a normal surveillance colonoscopy and a normal ileocolonic anastomosis.  He saw [facility] a few times and he saw them in 2021 with several different GI complaints such as dysphagia, bloating and abdominal  discomfort.  He had an MRI enterography in July 2021 which revealed no obstruction, lesions or strictures  Today CT scan again suggested some type of fecaliths as well as fecalization of enteric contents near the ileocolonic anastomosis.  CT scan from March 3 showed edema in the mesentery in the right lower quadrant along with a large calcified fecalith with adjacent small bowel wall thickening near the ileocolonic anastomosis  Past Medical History:  Diagnosis Date   Anemia    Arthritis    Back pain    Cancer ([place])    colon   Hypertension     Past Surgical History:  Procedure Laterality Date   COLON SURGERY     colectomy, R   COLONOSCOPY WITH PROPOFOL N/A 04/25/2015   Procedure: COLONOSCOPY WITH PROPOFOL;  Surgeon: Garlan Fair, MD;  Location: [facility];  Service: Endoscopy;  Laterality: N/A;   ESOPHAGOGASTRODUODENOSCOPY (EGD) WITH PROPOFOL N/A 10/02/2016   Procedure: ESOPHAGOGASTRODUODENOSCOPY (EGD) WITH PROPOFOL;  Surgeon: Garlan Fair, MD;  Location: [facility];  Service: Endoscopy;  Laterality: N/A;   EYE SURGERY     bilateral cataracts with lens implants   SPINE SURGERY      No family history on file.  Social:  reports that he has never smoked. He has never used smokeless tobacco. He reports current alcohol use. He reports that he does not use drugs.  Allergies:  Allergies  Allergen Reactions   Tramadol Itching    Medications: I have reviewed  the patient's current medications.   ROS - all of the below systems have been reviewed with the patient and positives are indicated with bold text General: chills, fever or night sweats Eyes: blurry vision or double vision ENT: epistaxis or sore throat Allergy/Immunology: itchy/watery eyes or nasal congestion Hematologic/Lymphatic: bleeding problems, blood clots or swollen lymph nodes Endocrine: temperature intolerance or unexpected weight changes Breast: new or changing breast lumps or nipple discharge Resp:  cough, shortness of breath, or wheezing CV: chest pain or dyspnea on exertion GI: as per HPI GU: dysuria, trouble voiding, or hematuria MSK: joint pain or joint stiffness Neuro: TIA or stroke symptoms Derm: pruritus and skin lesion changes Psych: anxiety and depression  PE Blood pressure (!) 171/76, pulse 77, temperature 98.3 F (36.8 C), temperature source Oral, resp. rate 18, height '5\' 8"'$  (1.727 m), weight 65 kg, SpO2 100 %. Constitutional: NAD; conversant; no deformities Eyes: Moist conjunctiva; no lid lag; anicteric; PERRL Neck: Trachea midline; no thyromegaly Lungs: Normal respiratory effort; no tactile fremitus CV: RRR; no palpable thrills; no pitting edema GI: Abd soft, distended, nontender, old midline incision; no palpable hepatosplenomegaly MSK: Normal gait; no clubbing/cyanosis Psychiatric: Appropriate affect; alert and oriented x3 Lymphatic: No palpable cervical or axillary lymphadenopathy Skin:no rash/jaundice/lesions  Results for orders placed or performed during the hospital encounter of 02/17/23 (from the past 48 hour(s))  CBC with Differential     Status: Abnormal   Collection Time: 02/17/23 11:46 AM  Result Value Ref Range   WBC 10.6 (H) 4.0 - 10.5 K/uL   RBC 3.60 (L) 4.22 - 5.81 MIL/uL   Hemoglobin 10.7 (L) 13.0 - 17.0 g/dL   HCT 33.2 (L) 39.0 - 52.0 %   MCV 92.2 80.0 - 100.0 fL   MCH 29.7 26.0 - 34.0 pg   MCHC 32.2 30.0 - 36.0 g/dL   RDW 13.5 11.5 - 15.5 %   Platelets 408 (H) 150 - 400 K/uL   nRBC 0.0 0.0 - 0.2 %   Neutrophils Relative % 74 %   Neutro Abs 7.8 (H) 1.7 - 7.7 K/uL   Lymphocytes Relative 14 %   Lymphs Abs 1.5 0.7 - 4.0 K/uL   Monocytes Relative 10 %   Monocytes Absolute 1.0 0.1 - 1.0 K/uL   Eosinophils Relative 1 %   Eosinophils Absolute 0.1 0.0 - 0.5 K/uL   Basophils Relative 0 %   Basophils Absolute 0.0 0.0 - 0.1 K/uL   Immature Granulocytes 1 %   Abs Immature Granulocytes 0.09 (H) 0.00 - 0.07 K/uL    Comment: Performed at [facility], [place] [address], [place], [place] [address]  Comprehensive metabolic panel     Status: Abnormal   Collection Time: 02/17/23 11:46 AM  Result Value Ref Range   Sodium 139 135 - 145 mmol/L   Potassium 4.1 3.5 - 5.1 mmol/L   Chloride 105 98 - 111 mmol/L   CO2 25 22 - 32 mmol/L   Glucose, Bld 137 (H) 70 - 99 mg/dL    Comment: Glucose reference range applies only to samples taken after fasting for at least 8 hours.   BUN 15 8 - 23 mg/dL   Creatinine, Ser 1.37 (H) 0.61 - 1.24 mg/dL   Calcium 8.7 (L) 8.9 - 10.3 mg/dL   Total Protein 7.5 6.5 - 8.1 g/dL   Albumin 3.4 (L) 3.5 - 5.0 g/dL   AST 41 15 - 41 U/L   ALT 25 0 - 44 U/L   Alkaline Phosphatase  54 38 - 126 U/L   Total Bilirubin 0.8 0.3 - 1.2 mg/dL   GFR, Estimated 51 (L) >60 mL/min    Comment: (NOTE) Calculated using the CKD-EPI Creatinine Equation (2021)    Anion gap 9 5 - 15    Comment: Performed at [facility], [place] [address] [place], [place] [address]  Lipase, blood     Status: None   Collection Time: 02/17/23 11:46 AM  Result Value Ref Range   Lipase 32 11 - 51 U/L    Comment: Performed at [facility], [place] [address] [place], [place] [address]  Lactic acid, plasma     Status: None   Collection Time: 02/17/23 11:46 AM  Result Value Ref Range   Lactic Acid, Venous 1.3 0.5 - 1.9 mmol/L    Comment: Performed at [facility], [place] [address], [place], [place] [address]  Resp panel by RT-PCR (RSV, Flu A&B, Covid) Anterior Nasal Swab     Status: None   Collection Time: 02/17/23 11:46 AM   Specimen: Anterior Nasal Swab  Result Value Ref Range   SARS Coronavirus 2 by RT PCR NEGATIVE NEGATIVE    Comment: (NOTE) SARS-CoV-2 target nucleic acids are NOT DETECTED.  The SARS-CoV-2 RNA is generally detectable in upper respiratory specimens during the acute phase of infection. The lowest concentration of SARS-CoV-2 viral copies this assay can detect  is 138 copies/mL. A negative result does not preclude SARS-Cov-2 infection and should not be used as the sole basis for treatment or other patient management decisions. A negative result may occur with  improper specimen collection/handling, submission of specimen other than nasopharyngeal swab, presence of viral mutation(s) within the areas targeted by this assay, and inadequate number of viral copies(<138 copies/mL). A negative result must be combined with clinical observations, patient history, and epidemiological information. The expected result is Negative.  Fact Sheet for Patients:  EntrepreneurPulse.com.au  Fact Sheet for Healthcare Providers:  IncredibleEmployment.be  This test is no t yet approved or cleared by the [place] FDA and  has been authorized for detection and/or diagnosis of SARS-CoV-2 by FDA under an Emergency Use Authorization (EUA). This EUA will remain  in effect (meaning this test can be used) for the duration of the COVID-19 declaration under Section 564(b)(1) of the Act, 21 U.S.C.section 360bbb-3(b)(1), unless the authorization is terminated  or revoked sooner.       Influenza A by PCR NEGATIVE NEGATIVE   Influenza B by PCR NEGATIVE NEGATIVE    Comment: (NOTE) The Xpert Xpress SARS-CoV-2/FLU/RSV plus assay is intended as an aid in the diagnosis of influenza from Nasopharyngeal swab specimens and should not be used as a sole basis for treatment. Nasal washings and aspirates are unacceptable for Xpert Xpress SARS-CoV-2/FLU/RSV testing.  Fact Sheet for Patients: EntrepreneurPulse.com.au  Fact Sheet for Healthcare Providers: IncredibleEmployment.be  This test is not yet approved or cleared by the [place] FDA and has been authorized for detection and/or diagnosis of SARS-CoV-2 by FDA under an Emergency Use Authorization (EUA). This EUA will remain in effect (meaning this  test can be used) for the duration of the COVID-19 declaration under Section 564(b)(1) of the Act, 21 U.S.C. section 360bbb-3(b)(1), unless the authorization is terminated or revoked.     Resp Syncytial Virus by PCR NEGATIVE NEGATIVE    Comment: (NOTE) Fact Sheet for Patients: EntrepreneurPulse.com.au  Fact Sheet for Healthcare Providers: IncredibleEmployment.be  This test is not yet approved or cleared by the [place]  FDA and has been authorized for detection and/or diagnosis of SARS-CoV-2 by FDA under an Emergency Use Authorization (EUA). This EUA will remain in effect (meaning this test can be used) for the duration of the COVID-19 declaration under Section 564(b)(1) of the Act, 21 U.S.C. section 360bbb-3(b)(1), unless the authorization is terminated or revoked.  Performed at [facility], [place] [address] [place], [place] [address]     CT Angio Abd/Pel W and/or Wo Contrast  Result Date: 02/17/2023 CLINICAL DATA:  Left upper quadrant pain.  Mesenteric ischemia. EXAM: CTA ABDOMEN AND PELVIS WITHOUT AND WITH CONTRAST TECHNIQUE: Multidetector CT imaging of the abdomen and pelvis was performed using the standard protocol during bolus administration of intravenous contrast. Multiplanar reconstructed images and MIPs were obtained and reviewed to evaluate the vascular anatomy. RADIATION DOSE REDUCTION: This exam was performed according to the departmental dose-optimization program which includes automated exposure control, adjustment of the mA and/or kV according to patient size and/or use of iterative reconstruction technique. CONTRAST:  171m OMNIPAQUE IOHEXOL 350 MG/ML SOLN COMPARISON:  02/10/2023 FINDINGS: VASCULAR Aorta: Normal caliber aorta without aneurysm, dissection, vasculitis or significant stenosis. 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 Appearance is not  characteristic for median arcuate ligament mass-effect although no appreciable atherosclerotic plaque is seen at this level. Celiac lumen is well opacified distal to the stenosis with normal opacification of the common hepatic artery and splenic artery. SMA: Patent without evidence of aneurysm, dissection, vasculitis or significant stenosis. Renals: Both renal arteries are patent without evidence of aneurysm, dissection, vasculitis, fibromuscular dysplasia or significant stenosis. IMA: Patent without evidence of aneurysm, dissection, vasculitis or significant stenosis. Inflow: Patent without evidence of aneurysm, dissection, vasculitis or significant stenosis. Proximal Outflow: Bilateral common femoral and visualized portions of the superficial and profunda femoral arteries are patent without evidence of aneurysm, dissection, vasculitis or significant stenosis. Veins: Portal vein and superior mesenteric vein are patent. No portal venous gas. No obvious venous abnormality within the limitations of this arterial phase study. Review of the MIP images confirms the above findings. NON-VASCULAR Lower chest: Tiny bilateral pleural effusions with dependent atelectasis. The heart is enlarged. Hepatobiliary: No suspicious focal abnormality within the liver parenchyma. There is no evidence for gallstones, gallbladder wall thickening, or pericholecystic fluid. No intrahepatic or extrahepatic biliary dilation. Pancreas: No focal mass lesion. No dilatation of the main duct. No intraparenchymal cyst. No peripancreatic edema. Spleen: No splenomegaly. No focal mass lesion. Adrenals/Urinary Tract: No adrenal nodule or mass. Small cyst noted both kidneys. No followup imaging is recommended. 1.8 x 1.9 cm enhancing lesion identified medial interpolar left kidney (image 80/5) this cannot definitely be seen to arise from the cortex although renal cell carcinoma is a concern. Transitional cell/urothelial neoplasm also a consideration. No  evidence for hydroureter. The urinary bladder appears normal for the degree of distention. Midline posterior bladder diverticulum evident. Stomach/Bowel: Stomach is distended with fluid. Duodenum is normally positioned as is the ligament of Treitz. Small bowel loops in the abdomen and pelvis are dilated up to 3.6 cm diameter. Small bowel in the right pelvis shows fecalization of enteric contents suggesting decreased transit. This is just proximal to what appears to be a enterocolic anastomosis (image 122/5). An apparent blind-ending small bowel structure with associated staple line again noted and 3.8 x 2.2 cm enterolith/fecalith (image 127/5). This is filled with gas and debris in the lumen with ill-defined wall and adjacent edema/inflammation, similar to prior study. No small bowel  pneumatosis or substantial small bowel wall thickening. No definite areas of small-bowel wall non enhancement. No gross colonic mass. No colonic wall thickening. Diverticular changes noted left colon without diverticulitis. Lymphatic: A collar of soft tissue attenuation in cases the infrarenal abdominal aorta and tracks down along the right common iliac artery. No pelvic sidewall lymphadenopathy. Reproductive: The prostate gland and seminal vesicles are unremarkable. Other: Vascular congestion with interloop mesenteric fluid noted in the small bowel mesentery subtending the dilated small bowel loops. Small volume free fluid noted around the liver and spleen with small volume free fluid in the pelvis. Musculoskeletal: Left groin hernia contains only fat. No worrisome lytic or sclerotic osseous abnormality. Lumbosacral fusion hardware evident. IMPRESSION: 1. 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 Appearance is not characteristic for median arcuate ligament mass-effect although no appreciable atherosclerotic plaque is seen at this level. Celiac lumen is well opacified distal to the stenosis with normal  opacification of the common hepatic artery and splenic artery. 2. A collar of soft tissue attenuation in cases the infrarenal abdominal aorta and tracks down along the right common iliac artery. Imaging features could reflect vasculitis or retroperitoneal fibrosis. 3. Interval progression of small bowel dilatation since 02/10/2023. Dilated small bowel in the right pelvis demonstrates fecalization of enteric contents suggesting decreased transit and small bowel obstruction. This dilated abnormal appearing loop of small bowel appears to track into the ileocolic anastomosis. 4. Vascular congestion with interloop mesenteric fluid seen in small bowel mesentery of the abdomen and pelvis. There is no overt small bowel wall thickening and no discernible small bowel pneumatosis to suggest overt small bowel ischemia on this study. No portal venous gas. 5. Apparent blind-ending small bowel structure with associated staple line and 3.8 x 2.2 cm enterolith/fecalith. This may represent a blind-ending stump related to anastomosis given the extensive bowel surgery noted in the right lower quadrant. This appearance has been present on studies dating back to 10/05/2016 although ill-defined wall thickening is new since prior studies. Infectious/inflammatory enteritis would be a consideration. Ischemia considered less likely but not excluded. 6. 1.8 x 1.9 cm enhancing lesion medial interpolar left kidney cannot definitely be seen to arise from the cortex although renal cell carcinoma is a consideration. Transitional cell/urothelial neoplasm also a consideration. MRI of the abdomen with and without contrast recommended to further evaluate. 7. Tiny bilateral pleural effusions with dependent atelectasis. 8. Left groin hernia contains only fat. Electronically Signed   By: Misty Stanley M.D.   On: 02/17/2023 13:58    Imaging: Personally reviewed  A/P: Cory Bright is an 87 y.o. male with  pSBO H/o right colectomy 1993  [facility] HTN HLD BPH Recent admission for enteritis   I do not believe the patient needs urgent laparotomy.  He is nontender.  His vital signs are stable.  There is no sign of ischemia on imaging and a negative lactate.  Recommend nonoperative management to start with, bowel rest, NG tube, small bowel obstruction protocol.  Discussed this with patient and wife  Appears to have some type of intermittent process going on near his ileocolonic anastomosis as seen on intermittent imaging throughout the years but tends to resolve at times  Data reviewed - CT enterography 10/05/2016; Dr Durenda Age colonoscopy 10/02/16; MR [facility] 06/[facility] - no mass/stricture/obstruction;  H&P for sbo Dr Marcello Moores 07/2014, DC summary Dr Zella Richer 07/26/2014; [place] clinic note Riley Nearing 04/01/20 - n/v/bloating; CT 02/11/23  High level of medical decision making, extensive  data review,  acute problem with exacerbation with unclear prognosis  Leighton Ruff. Redmond Pulling, MD, FACS General, Bariatric, & Minimally Invasive Surgery [place]

## 2023-02-18 ENCOUNTER — Inpatient Hospital Stay (HOSPITAL_COMMUNITY): Payer: PPO

## 2023-02-18 DIAGNOSIS — E785 Hyperlipidemia, unspecified: Secondary | ICD-10-CM | POA: Diagnosis not present

## 2023-02-18 DIAGNOSIS — N400 Enlarged prostate without lower urinary tract symptoms: Secondary | ICD-10-CM | POA: Diagnosis not present

## 2023-02-18 DIAGNOSIS — I10 Essential (primary) hypertension: Secondary | ICD-10-CM

## 2023-02-18 DIAGNOSIS — D649 Anemia, unspecified: Secondary | ICD-10-CM | POA: Insufficient documentation

## 2023-02-18 DIAGNOSIS — K56609 Unspecified intestinal obstruction, unspecified as to partial versus complete obstruction: Secondary | ICD-10-CM | POA: Diagnosis not present

## 2023-02-18 LAB — CBC
HCT: 29.2 % — ABNORMAL LOW (ref 39.0–52.0)
Hemoglobin: 9.3 g/dL — ABNORMAL LOW (ref 13.0–17.0)
MCH: 29.5 pg (ref 26.0–34.0)
MCHC: 31.8 g/dL (ref 30.0–36.0)
MCV: 92.7 fL (ref 80.0–100.0)
Platelets: 409 10*3/uL — ABNORMAL HIGH (ref 150–400)
RBC: 3.15 MIL/uL — ABNORMAL LOW (ref 4.22–5.81)
RDW: 13.7 % (ref 11.5–15.5)
WBC: 9.8 10*3/uL (ref 4.0–10.5)
nRBC: 0 % (ref 0.0–0.2)

## 2023-02-18 LAB — BASIC METABOLIC PANEL
Anion gap: 3 — ABNORMAL LOW (ref 5–15)
BUN: 17 mg/dL (ref 8–23)
CO2: 23 mmol/L (ref 22–32)
Calcium: 8.1 mg/dL — ABNORMAL LOW (ref 8.9–10.3)
Chloride: 111 mmol/L (ref 98–111)
Creatinine, Ser: 1.16 mg/dL (ref 0.61–1.24)
GFR, Estimated: >60 mL/min (ref >60)
Glucose, Bld: 113 mg/dL — ABNORMAL HIGH (ref 70–99)
Potassium: 3.5 mmol/L (ref 3.5–5.1)
Sodium: 137 mmol/L (ref 135–145)

## 2023-02-18 MED ORDER — TAMSULOSIN HCL 0.4 MG PO CAPS
0.4000 mg | ORAL_CAPSULE | Freq: Every day | ORAL | Status: DC
  Administered 2023-02-18 – 2023-02-21 (×4): 0.4 mg via ORAL
  Filled 2023-02-18 (×4): qty 1

## 2023-02-18 MED ORDER — PRAVASTATIN SODIUM 20 MG PO TABS
40.0000 mg | ORAL_TABLET | Freq: Every evening | ORAL | Status: DC
  Administered 2023-02-18 – 2023-02-20 (×3): 40 mg via ORAL
  Filled 2023-02-18 (×3): qty 2

## 2023-02-18 NOTE — Plan of Care (Signed)
  Problem: Activity: Goal: Risk for activity intolerance will decrease Outcome: Progressing   Problem: Elimination: Goal: Will not experience complications related to bowel motility Outcome: Progressing   Problem: Pain Managment: Goal: General experience of comfort will improve Outcome: Progressing   

## 2023-02-18 NOTE — Progress Notes (Addendum)
Progress Note     Subjective: Pain present on admission resolved. Had episode of mild pain in lower abdomen yesterday evening which improved with pain medications. He has passed. Several loose bowel movements. He denies n/v this am. He is still bloated   Objective: Vital signs in last 24 hours: Temp:  [98.3 F (36.8 C)-98.7 F (37.1 C)] 98.4 F (36.9 C) (03/11 0336) Pulse Rate:  [66-84] 79 (03/11 0336) Resp:  [14-18] 18 (03/11 0336) BP: (116-180)/(55-78) 139/61 (03/11 0336) SpO2:  [95 %-100 %] 96 % (03/11 0336) Weight:  [64.4 kg-65 kg] 65 kg (03/10 1117) Last BM Date : 02/17/23  Intake/Output from previous day: 03/10 0701 - 03/11 0700 In: 1647.5 [I.V.:1017.5; NG/GT:30; IV Piggyback:600] Out: 900 [Urine:100; Emesis/NG output:800] Intake/Output this shift: Total I/O In: 0  Out: 25 [Emesis/NG output:25]  PE: General: pleasant, WD, male who is laying in bed in NAD Lungs: CTAB, no wheezes, rhonchi, or rales noted.  Respiratory effort nonlabored Abd: soft, NT, hyperactive BS. Moderately distended. NGT with scan bilious output in cannister  Skin: warm and dry Psych: A&Ox3 with an appropriate affect.    Lab Results:  Recent Labs    02/17/23 1146 02/18/23 0357  WBC 10.6* 9.8  HGB 10.7* 9.3*  HCT 33.2* 29.2*  PLT 408* 409*   BMET Recent Labs    02/17/23 1146 02/18/23 0357  NA 139 137  K 4.1 3.5  CL 105 111  CO2 25 23  GLUCOSE 137* 113*  BUN 15 17  CREATININE 1.37* 1.16  CALCIUM 8.7* 8.1*   PT/INR No results for input(s): "LABPROT", "INR" in the last 72 hours. CMP     Component Value Date/Time   NA 137 02/18/2023 0357   K 3.5 02/18/2023 0357   CL 111 02/18/2023 0357   CO2 23 02/18/2023 0357   GLUCOSE 113 (H) 02/18/2023 0357   BUN 17 02/18/2023 0357   CREATININE 1.16 02/18/2023 0357   CALCIUM 8.1 (L) 02/18/2023 0357   PROT 7.5 02/17/2023 1146   ALBUMIN 3.4 (L) 02/17/2023 1146   AST 41 02/17/2023 1146   ALT 25 02/17/2023 1146   ALKPHOS 54  02/17/2023 1146   BILITOT 0.8 02/17/2023 1146   GFRNONAA >60 02/18/2023 0357   GFRAA >90 07/25/2014 0542   Lipase     Component Value Date/Time   LIPASE 32 02/17/2023 1146       Studies/Results: DG Abd Portable 1V-Small Bowel Obstruction Protocol-initial, 8 hr delay  Result Date: 02/18/2023 CLINICAL DATA:  Small-bowel obstruction, 8 hour delay EXAM: PORTABLE ABDOMEN - 1 VIEW COMPARISON:  Radiograph 02/17/2023, CT 02/17/2023 FINDINGS: There are persistently dilated loops of small bowel, measuring up to 3.6 cm. There is radiopaque intraluminal material within the small bowel, ascending colon, and likely the rectum. There are postsurgical changes in the right lower hemiabdomen. Right lower quadrant fecalith noted. NG tube tip and side port overlie the stomach. Degenerative changes of the spine with prior lower lumbar fusion. IMPRESSION: Persistently dilated small bowel with passage of oral contrast material into the colon, suggesting partial obstruction or ileus. Electronically Signed   By: Maurine Simmering M.D.   On: 02/18/2023 08:09   DG Abd Portable 1V-Small Bowel Protocol-Position Verification  Result Date: 02/17/2023 CLINICAL DATA:  NG placement. EXAM: PORTABLE ABDOMEN - 1 VIEW COMPARISON:  Chest radiograph dated 07/21/2014 and CT abdomen pelvis dated 02/17/2023. FINDINGS: Enteric tube with tip and side-port in the left upper abdomen, likely in the body of the stomach. Mildly dilated small bowel  loops. IMPRESSION: Enteric tube in the body of the stomach. Electronically Signed   By: Anner Crete M.D.   On: 02/17/2023 20:54   DG Abdomen 1 View  Result Date: 02/17/2023 CLINICAL DATA:  NG tube placement. EXAM: ABDOMEN - 1 VIEW COMPARISON:  02/11/2023 FINDINGS: NG tube tip is in the stomach with proximal side port in the region of the GE junction. Gaseous small bowel distension noted left upper quadrant. IMPRESSION: NG tube tip is in the proximal to mid stomach with side port of the NG tube in  the region of the GE junction. Tube could be advanced 3-4 cm to ensure proximal side port placement below the GE junction as clinically warranted. Electronically Signed   By: Misty Stanley M.D.   On: 02/17/2023 15:13   CT Angio Abd/Pel W and/or Wo Contrast  Result Date: 02/17/2023 CLINICAL DATA:  Left upper quadrant pain.  Mesenteric ischemia. EXAM: CTA ABDOMEN AND PELVIS WITHOUT AND WITH CONTRAST TECHNIQUE: Multidetector CT imaging of the abdomen and pelvis was performed using the standard protocol during bolus administration of intravenous contrast. Multiplanar reconstructed images and MIPs were obtained and reviewed to evaluate the vascular anatomy. RADIATION DOSE REDUCTION: This exam was performed according to the departmental dose-optimization program which includes automated exposure control, adjustment of the mA and/or kV according to patient size and/or use of iterative reconstruction technique. CONTRAST:  113m OMNIPAQUE IOHEXOL 350 MG/ML SOLN COMPARISON:  02/10/2023 FINDINGS: VASCULAR Aorta: Normal caliber aorta without aneurysm, dissection, vasculitis or significant stenosis. 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 Appearance is not characteristic for median arcuate ligament mass-effect although no appreciable atherosclerotic plaque is seen at this level. Celiac lumen is well opacified distal to the stenosis with normal opacification of the common hepatic artery and splenic artery. SMA: Patent without evidence of aneurysm, dissection, vasculitis or significant stenosis. Renals: Both renal arteries are patent without evidence of aneurysm, dissection, vasculitis, fibromuscular dysplasia or significant stenosis. IMA: Patent without evidence of aneurysm, dissection, vasculitis or significant stenosis. Inflow: Patent without evidence of aneurysm, dissection, vasculitis or significant stenosis. Proximal Outflow: Bilateral common femoral and  visualized portions of the superficial and profunda femoral arteries are patent without evidence of aneurysm, dissection, vasculitis or significant stenosis. Veins: Portal vein and superior mesenteric vein are patent. No portal venous gas. No obvious venous abnormality within the limitations of this arterial phase study. Review of the MIP images confirms the above findings. NON-VASCULAR Lower chest: Tiny bilateral pleural effusions with dependent atelectasis. The heart is enlarged. Hepatobiliary: No suspicious focal abnormality within the liver parenchyma. There is no evidence for gallstones, gallbladder wall thickening, or pericholecystic fluid. No intrahepatic or extrahepatic biliary dilation. Pancreas: No focal mass lesion. No dilatation of the main duct. No intraparenchymal cyst. No peripancreatic edema. Spleen: No splenomegaly. No focal mass lesion. Adrenals/Urinary Tract: No adrenal nodule or mass. Small cyst noted both kidneys. No followup imaging is recommended. 1.8 x 1.9 cm enhancing lesion identified medial interpolar left kidney (image 80/5) this cannot definitely be seen to arise from the cortex although renal cell carcinoma is a concern. Transitional cell/urothelial neoplasm also a consideration. No evidence for hydroureter. The urinary bladder appears normal for the degree of distention. Midline posterior bladder diverticulum evident. Stomach/Bowel: Stomach is distended with fluid. Duodenum is normally positioned as is the ligament of Treitz. Small bowel loops in the abdomen and pelvis are dilated up to 3.6 cm diameter. Small bowel in the right pelvis shows fecalization of  enteric contents suggesting decreased transit. This is just proximal to what appears to be a enterocolic anastomosis (image 122/5). An apparent blind-ending small bowel structure with associated staple line again noted and 3.8 x 2.2 cm enterolith/fecalith (image 127/5). This is filled with gas and debris in the lumen with ill-defined  wall and adjacent edema/inflammation, similar to prior study. No small bowel pneumatosis or substantial small bowel wall thickening. No definite areas of small-bowel wall non enhancement. No gross colonic mass. No colonic wall thickening. Diverticular changes noted left colon without diverticulitis. Lymphatic: A collar of soft tissue attenuation in cases the infrarenal abdominal aorta and tracks down along the right common iliac artery. No pelvic sidewall lymphadenopathy. Reproductive: The prostate gland and seminal vesicles are unremarkable. Other: Vascular congestion with interloop mesenteric fluid noted in the small bowel mesentery subtending the dilated small bowel loops. Small volume free fluid noted around the liver and spleen with small volume free fluid in the pelvis. Musculoskeletal: Left groin hernia contains only fat. No worrisome lytic or sclerotic osseous abnormality. Lumbosacral fusion hardware evident. IMPRESSION: 1. 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 Appearance is not characteristic for median arcuate ligament mass-effect although no appreciable atherosclerotic plaque is seen at this level. Celiac lumen is well opacified distal to the stenosis with normal opacification of the common hepatic artery and splenic artery. 2. A collar of soft tissue attenuation in cases the infrarenal abdominal aorta and tracks down along the right common iliac artery. Imaging features could reflect vasculitis or retroperitoneal fibrosis. 3. Interval progression of small bowel dilatation since 02/10/2023. Dilated small bowel in the right pelvis demonstrates fecalization of enteric contents suggesting decreased transit and small bowel obstruction. This dilated abnormal appearing loop of small bowel appears to track into the ileocolic anastomosis. 4. Vascular congestion with interloop mesenteric fluid seen in small bowel mesentery of the abdomen and pelvis. There is no overt small bowel  wall thickening and no discernible small bowel pneumatosis to suggest overt small bowel ischemia on this study. No portal venous gas. 5. Apparent blind-ending small bowel structure with associated staple line and 3.8 x 2.2 cm enterolith/fecalith. This may represent a blind-ending stump related to anastomosis given the extensive bowel surgery noted in the right lower quadrant. This appearance has been present on studies dating back to 10/05/2016 although ill-defined wall thickening is new since prior studies. Infectious/inflammatory enteritis would be a consideration. Ischemia considered less likely but not excluded. 6. 1.8 x 1.9 cm enhancing lesion medial interpolar left kidney cannot definitely be seen to arise from the cortex although renal cell carcinoma is a consideration. Transitional cell/urothelial neoplasm also a consideration. MRI of the abdomen with and without contrast recommended to further evaluate. 7. Tiny bilateral pleural effusions with dependent atelectasis. 8. Left groin hernia contains only fat. Electronically Signed   By: Misty Stanley M.D.   On: 02/17/2023 13:58    Anti-infectives: Anti-infectives (From admission, onward)    Start     Dose/Rate Route Frequency Ordered Stop   02/17/23 2245  ciprofloxacin (CIPRO) IVPB 400 mg        400 mg 200 mL/hr over 60 Minutes Intravenous Every 12 hours 02/17/23 2156     02/17/23 2245  metroNIDAZOLE (FLAGYL) IVPB 500 mg        500 mg 100 mL/hr over 60 Minutes Intravenous Every 12 hours 02/17/23 2156     02/17/23 2200  ciprofloxacin (CIPRO) tablet 500 mg  Status:  Discontinued  500 mg Per Tube 2 times daily 02/17/23 1537 02/17/23 2156   02/17/23 2200  metroNIDAZOLE (FLAGYL) tablet 500 mg  Status:  Discontinued        500 mg Per Tube Every 12 hours 02/17/23 1537 02/17/23 2156        Assessment/Plan pSBO H/o R colectomy 1993 [facility] (admitted 3/2-3/6)  - NGT in place with 800 ml/24h charted, he has had several BMs - SBO  protocol started with 8 hr abd film showing contrast through colon. SB still dilated - still with significant distension on exam - leukocytosis resolved - no acute surgical intervention indicated at this time Continue NGT/NPO today given distension and NG output. 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    LOS: 1 day   [facility] 02/18/2023, 8:33 AM Please see Amion for pager number during day hours 7:00am-4:30pm

## 2023-02-18 NOTE — Assessment & Plan Note (Signed)
No clinical bleeding

## 2023-02-18 NOTE — Assessment & Plan Note (Signed)
-   Resume pravastatin when able to take PO

## 2023-02-18 NOTE — Assessment & Plan Note (Signed)
-   Resume flomax when able to take PO

## 2023-02-18 NOTE — Assessment & Plan Note (Addendum)
Recent admission for colitis (fever, leukocytosis, abdominal cramps and diarrhea), CT showed bowel thickening, so treated as colitis with antibiotics, Cdiff ruled out, and discharged.  Subsequently developed colicky pain after eating and frequent stools again, which became severe, so he returned to the ER and this time CT showed findings of SBO.  Agree with Gen Surg, the fecolith and inflammation of his stump and ileocolonic anastomosis seems periodic.    X-ray today shows contrast through to the colon - Clamp NG this afternoon  - Consult Gen Surg - Continue IV fluids, antiemetics - Hold antibiotics - If this doesn't resolve well, I would have a low threshold to consult GI to evaluate his anastomosis or try doppler US of the abdomen

## 2023-02-18 NOTE — Assessment & Plan Note (Signed)
Incidental finding - Needs renal MRI for follow up

## 2023-02-18 NOTE — Hospital Course (Signed)
Cory Bright is an 87 y.o. M with colon CA s/p colectomy in the 90s, and HTN who was just admitted with fever and diarrhea last week, discharged on Cipro/Flagyl, then returned with recurrent post-prandial pain.   3/10: Readmitted, Gen Surg consulted, CT shows partial SBO, NG placed

## 2023-02-18 NOTE — Progress Notes (Signed)
  Progress Note   Patient: Cory Bright ION:[id number] DOB: 02-12-1936 DOA: 02/17/2023     1 DOS: the patient was seen and examined on 02/18/2023 at 9:24AM      Brief hospital course: Cory Bright is an 87 y.o. M with colon CA s/p colectomy in the 90s, and HTN who was just admitted with fever and diarrhea last week, discharged on Cipro/Flagyl, then returned with recurrent post-prandial pain.   3/10: Readmitted, Gen Surg consulted, CT shows partial SBO, NG placed     Assessment and Plan: * SBO (small bowel obstruction) ([place]) Recent admission for colitis (fever, leukocytosis, abdominal cramps and diarrhea), CT showed bowel thickening, so treated as colitis with antibiotics, Cdiff ruled out, and discharged.  Subsequently developed colicky pain after eating and frequent stools again, which became severe, so he returned to the ER and this time CT showed findings of SBO.  Agree with Gen Surg, the fecolith and inflammation of his stump and ileocolonic anastomosis seems periodic.    X-ray today shows contrast through to the colon - Clamp NG this afternoon  - Consult Gen Surg - Continue IV fluids, antiemetics - Hold antibiotics - If this doesn't resolve well, I would have a low threshold to consult GI to evaluate his anastomosis or try doppler US of the abdomen    Normocytic anemia No clincal bleeding  Renal lesion Incidental finding - Needs renal MRI for follow up  BPH (benign prostatic hyperplasia) - Resume flomax when able to take PO  HLD (hyperlipidemia) - Resume pravastatin when able to take PO  HTN (hypertension) BP elevated - COntinue PRN hydralazine - Resume lisinopril when able to take PO           Subjective: Distension improved, pain better.  No fever, no respiratory symptoms.  GEn surg considering clamping this afternoon.  Still having frequent loose BMs.     Physical Exam: BP (!) 165/69 (BP Location: Left Arm)   Pulse 76   Temp 98.7 F (37.1 C)  (Oral)   Resp 18   Ht 5\' 8"  (1.727 m)   Wt 65 kg   SpO2 96%   BMI 21.79 kg/m   Adult male, lying in bed, interactive and appropriate, NG tube in place RRR, no murmurs, no peripheral edema Respiratory normal, lungs clear without rales or wheezes Abdomen with voluntary guarding, mild tenderness diffusely, no rigidity or rebound Attention normal, affect appropriate, judgment and insight appear normal    Data Reviewed: Discussed with general surgery Basic metabolic panel normal Hemoglobin 9.3, no significant change CTA of the abdomen showed transition point, fecalith, inflammation around the anastomosis C. difficile negative on 3/4    Family Communication: Wife by phone    Disposition: Status is: Inpatient         Author: Edwin Dada, MD 02/18/2023 2:00 PM  For on call review [email or web address].

## 2023-02-18 NOTE — TOC CM/SW Note (Signed)
Transition of Care [facility]) Screening Note  Patient Details  Name: Cory Bright Date of Birth: [birth year]  Transition of Care [facility]) CM/SW Contact:    Sherie Don, LCSW Phone Number: 02/18/2023, 9:50 AM  Transition of Care Department [facility]) has reviewed patient and no TOC needs have been identified at this time. We will continue to monitor patient advancement through interdisciplinary progression rounds. If new patient transition needs arise, please place a TOC consult.

## 2023-02-18 NOTE — Assessment & Plan Note (Addendum)
BP elevated - Resume ACEi - Continue PRN hydralazine

## 2023-02-19 ENCOUNTER — Inpatient Hospital Stay (HOSPITAL_COMMUNITY): Payer: PPO

## 2023-02-19 DIAGNOSIS — D649 Anemia, unspecified: Secondary | ICD-10-CM

## 2023-02-19 DIAGNOSIS — E785 Hyperlipidemia, unspecified: Secondary | ICD-10-CM | POA: Diagnosis not present

## 2023-02-19 DIAGNOSIS — N400 Enlarged prostate without lower urinary tract symptoms: Secondary | ICD-10-CM | POA: Diagnosis not present

## 2023-02-19 DIAGNOSIS — K56609 Unspecified intestinal obstruction, unspecified as to partial versus complete obstruction: Secondary | ICD-10-CM | POA: Diagnosis not present

## 2023-02-19 DIAGNOSIS — I10 Essential (primary) hypertension: Secondary | ICD-10-CM | POA: Diagnosis not present

## 2023-02-19 LAB — CBC
HCT: 28.4 % — ABNORMAL LOW (ref 39.0–52.0)
Hemoglobin: 8.9 g/dL — ABNORMAL LOW (ref 13.0–17.0)
MCH: 28.8 pg (ref 26.0–34.0)
MCHC: 31.3 g/dL (ref 30.0–36.0)
MCV: 91.9 fL (ref 80.0–100.0)
Platelets: 347 10*3/uL (ref 150–400)
RBC: 3.09 MIL/uL — ABNORMAL LOW (ref 4.22–5.81)
RDW: 13.7 % (ref 11.5–15.5)
WBC: 7.9 10*3/uL (ref 4.0–10.5)
nRBC: 0 % (ref 0.0–0.2)

## 2023-02-19 LAB — BASIC METABOLIC PANEL
Anion gap: 11 (ref 5–15)
BUN: 18 mg/dL (ref 8–23)
CO2: 24 mmol/L (ref 22–32)
Calcium: 8.3 mg/dL — ABNORMAL LOW (ref 8.9–10.3)
Chloride: 105 mmol/L (ref 98–111)
Creatinine, Ser: 0.9 mg/dL (ref 0.61–1.24)
GFR, Estimated: >60 mL/min (ref >60)
Glucose, Bld: 89 mg/dL (ref 70–99)
Potassium: 3.4 mmol/L — ABNORMAL LOW (ref 3.5–5.1)
Sodium: 140 mmol/L (ref 135–145)

## 2023-02-19 MED ORDER — LISINOPRIL 20 MG PO TABS
20.0000 mg | ORAL_TABLET | Freq: Every day | ORAL | Status: DC
  Administered 2023-02-19 – 2023-02-21 (×3): 20 mg via ORAL
  Filled 2023-02-19 (×3): qty 1

## 2023-02-19 NOTE — Progress Notes (Signed)
  Progress Note   Patient: Cory Bright UVO:[id number] DOB: 1935/12/14 DOA: 02/17/2023     2 DOS: the patient was seen and examined on 02/19/2023 at 9:15AM      Brief hospital course: Mr. Guard is an 87 y.o. M with colon CA s/p colectomy in the 90s, and HTN who was just admitted with fever and diarrhea last week, discharged on Cipro/Flagyl, then returned with recurrent post-prandial pain.   3/10: Readmitted, Gen Surg consulted, CT shows partial SBO, NG placed 3/12: NG removed, tolerating clears     Assessment and Plan: * SBO (small bowel obstruction) ([place]) Recent admission for colitis (fever, leukocytosis, abdominal cramps and diarrhea), CT showed bowel thickening, so treated as colitis with antibiotics, Cdiff ruled out, and discharged.  Subsequently developed colicky pain after eating and frequent stools again, which became severe, so he returned to the ER and this time CT showed findings of SBO.  Agree with Gen Surg, the fecolith and inflammation of his stump and ileocolonic anastomosis seems periodic.    Tolerated clamping of NG yesterday well.  This morning, had trial of clears with clamped tube again, did well. - Remove NG - Stop fluids - Consult Gen Surg - I assume the radiographic findings of inflammation by his anastomosis is clinically insignificant, and the "celiac stenosis" as well (given good flow distal to it), but if the patient starts backtracking, I would have a low threshold to consult GI to evaluate his anastomosis or try doppler US of the abdomen    Normocytic anemia No clinical bleeding  Renal lesion Incidental finding - Needs renal MRI for follow up  BPH (benign prostatic hyperplasia) - Resume flomax    HLD (hyperlipidemia) - Resume pravastatin   HTN (hypertension) BP elevated - Resume ACEi - Continue PRN hydralazine           Subjective: A little nausea this morning, only 1 bowel movement overnight supportive fluids, no distention in  the abdomen, no fever, no hematochezia, no vomiting, no confusion.     Physical Exam: BP (!) 147/57 (BP Location: Left Arm)   Pulse 78   Temp 98.3 F (36.8 C) (Oral)   Resp 18   Ht 5\' 8"  (1.727 m)   Wt 65 kg   SpO2 98%   BMI 21.79 kg/m   Adult male, sitting up in chair, no acute distress, interactive RRR, no murmurs, no peripheral edema Respiratory normal, lungs clear without rales or wheezes Abdomen with some tenderness palpation on the left side, no rigidity, no guarding, no distention Attention normal, affect blunted, judgment and insight appear normal, face metric, speech fluent, moves upper extremities with normal strength and coordination    Data Reviewed: Discussed with general surgery Basic metabolic panel unremarkable Hemoglobin down to 8.9 on CBC    Family Communication: Wife by phone, called the son, no answer    Disposition: Status is: Inpatient The patient was admitted with small bowel obstruction  This appears to be resolving, if his symptoms continue to resolve tomorrow, he tolerates advancing his diet, possibly home tomorrow or Thursday        Author: Edwin Dada, MD 02/19/2023 2:56 PM  For on call review [email or web address].

## 2023-02-19 NOTE — Progress Notes (Signed)
Progress Note     Subjective: NGT clamped some yesterday without any pain or nausea. It is clamped this morning and he is feeling slightly nauseas but also hungry. He continue so have Bms. He is still bloated and having mild abdominal pain but overall improved from yesterday  Objective: Vital signs in last 24 hours: Temp:  [98.7 F (37.1 C)-99.2 F (37.3 C)] 98.7 F (37.1 C) (03/12 0446) Pulse Rate:  [76-91] 91 (03/12 0608) Resp:  [16-18] 18 (03/12 0446) BP: (165-180)/(64-69) 165/69 (03/12 0608) SpO2:  [94 %-96 %] 94 % (03/12 0446) Last BM Date : 02/17/23  Intake/Output from previous day: 03/11 0701 - 03/12 0700 In: 1571.9 [I.V.:1211.9; NG/GT:60; IV Piggyback:300] Out: 800 [Urine:150; Emesis/NG output:650] Intake/Output this shift: No intake/output data recorded.  PE: General: pleasant, WD, male who is sitting up in chair in NAD Lungs: Respiratory effort nonlabored on room air Abd: soft, NT, hyperactive BS. Mildly distended. NGT with scant bilious output in tubing. Currently clamped  Skin: warm and dry Psych: A&Ox3 with an appropriate affect.    Lab Results:  Recent Labs    02/18/23 0357 02/19/23 0432  WBC 9.8 7.9  HGB 9.3* 8.9*  HCT 29.2* 28.4*  PLT 409* 347    BMET Recent Labs    02/18/23 0357 02/19/23 0432  NA 137 140  K 3.5 3.4*  CL 111 105  CO2 23 24  GLUCOSE 113* 89  BUN 17 18  CREATININE 1.16 0.90  CALCIUM 8.1* 8.3*    PT/INR No results for input(s): "LABPROT", "INR" in the last 72 hours. CMP     Component Value Date/Time   NA 140 02/19/2023 0432   K 3.4 (L) 02/19/2023 0432   CL 105 02/19/2023 0432   CO2 24 02/19/2023 0432   GLUCOSE 89 02/19/2023 0432   BUN 18 02/19/2023 0432   CREATININE 0.90 02/19/2023 0432   CALCIUM 8.3 (L) 02/19/2023 0432   PROT 7.5 02/17/2023 1146   ALBUMIN 3.4 (L) 02/17/2023 1146   AST 41 02/17/2023 1146   ALT 25 02/17/2023 1146   ALKPHOS 54 02/17/2023 1146   BILITOT 0.8 02/17/2023 1146   GFRNONAA >60  02/19/2023 0432   GFRAA >90 07/25/2014 0542   Lipase     Component Value Date/Time   LIPASE 32 02/17/2023 1146       Studies/Results: DG Abd Portable 1V  Result Date: 02/19/2023 CLINICAL DATA:  Small bowel obstruction. EXAM: PORTABLE ABDOMEN - 1 VIEW COMPARISON:  02/18/19 for FINDINGS: Enteric tube tip and side port are in the stomach. There is been interval decompression of previously dilated loops of small bowel. Gas and enteric contrast material is noted within the colon. Decreased from previous exam. Postop change within the lumbar spine is again noted. IMPRESSION: 1. Interval decompression of previously dilated loops of small bowel. 2. Enteric tube tip and side port are in the stomach. Electronically Signed   By: Kerby Moors M.D.   On: 02/19/2023 07:31   DG Abd Portable 1V-Small Bowel Obstruction Protocol-initial, 8 hr delay  Result Date: 02/18/2023 CLINICAL DATA:  Small-bowel obstruction, 8 hour delay EXAM: PORTABLE ABDOMEN - 1 VIEW COMPARISON:  Radiograph 02/17/2023, CT 02/17/2023 FINDINGS: There are persistently dilated loops of small bowel, measuring up to 3.6 cm. There is radiopaque intraluminal material within the small bowel, ascending colon, and likely the rectum. There are postsurgical changes in the right lower hemiabdomen. Right lower quadrant fecalith noted. NG tube tip and side port overlie the stomach. Degenerative changes of  the spine with prior lower lumbar fusion. IMPRESSION: Persistently dilated small bowel with passage of oral contrast material into the colon, suggesting partial obstruction or ileus. Electronically Signed   By: Maurine Simmering M.D.   On: 02/18/2023 08:09   DG Abd Portable 1V-Small Bowel Protocol-Position Verification  Result Date: 02/17/2023 CLINICAL DATA:  NG placement. EXAM: PORTABLE ABDOMEN - 1 VIEW COMPARISON:  Chest radiograph dated 07/21/2014 and CT abdomen pelvis dated 02/17/2023. FINDINGS: Enteric tube with tip and side-port in the left upper  abdomen, likely in the body of the stomach. Mildly dilated small bowel loops. IMPRESSION: Enteric tube in the body of the stomach. Electronically Signed   By: Anner Crete M.D.   On: 02/17/2023 20:54   DG Abdomen 1 View  Result Date: 02/17/2023 CLINICAL DATA:  NG tube placement. EXAM: ABDOMEN - 1 VIEW COMPARISON:  02/11/2023 FINDINGS: NG tube tip is in the stomach with proximal side port in the region of the GE junction. Gaseous small bowel distension noted left upper quadrant. IMPRESSION: NG tube tip is in the proximal to mid stomach with side port of the NG tube in the region of the GE junction. Tube could be advanced 3-4 cm to ensure proximal side port placement below the GE junction as clinically warranted. Electronically Signed   By: Misty Stanley M.D.   On: 02/17/2023 15:13   CT Angio Abd/Pel W and/or Wo Contrast  Result Date: 02/17/2023 CLINICAL DATA:  Left upper quadrant pain.  Mesenteric ischemia. EXAM: CTA ABDOMEN AND PELVIS WITHOUT AND WITH CONTRAST TECHNIQUE: Multidetector CT imaging of the abdomen and pelvis was performed using the standard protocol during bolus administration of intravenous contrast. Multiplanar reconstructed images and MIPs were obtained and reviewed to evaluate the vascular anatomy. RADIATION DOSE REDUCTION: This exam was performed according to the departmental dose-optimization program which includes automated exposure control, adjustment of the mA and/or kV according to patient size and/or use of iterative reconstruction technique. CONTRAST:  175m OMNIPAQUE IOHEXOL 350 MG/ML SOLN COMPARISON:  02/10/2023 FINDINGS: VASCULAR Aorta: Normal caliber aorta without aneurysm, dissection, vasculitis or significant stenosis. 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 Appearance is not characteristic for median arcuate ligament mass-effect although no appreciable atherosclerotic plaque is seen at this level. Celiac lumen  is well opacified distal to the stenosis with normal opacification of the common hepatic artery and splenic artery. SMA: Patent without evidence of aneurysm, dissection, vasculitis or significant stenosis. Renals: Both renal arteries are patent without evidence of aneurysm, dissection, vasculitis, fibromuscular dysplasia or significant stenosis. IMA: Patent without evidence of aneurysm, dissection, vasculitis or significant stenosis. Inflow: Patent without evidence of aneurysm, dissection, vasculitis or significant stenosis. Proximal Outflow: Bilateral common femoral and visualized portions of the superficial and profunda femoral arteries are patent without evidence of aneurysm, dissection, vasculitis or significant stenosis. Veins: Portal vein and superior mesenteric vein are patent. No portal venous gas. No obvious venous abnormality within the limitations of this arterial phase study. Review of the MIP images confirms the above findings. NON-VASCULAR Lower chest: Tiny bilateral pleural effusions with dependent atelectasis. The heart is enlarged. Hepatobiliary: No suspicious focal abnormality within the liver parenchyma. There is no evidence for gallstones, gallbladder wall thickening, or pericholecystic fluid. No intrahepatic or extrahepatic biliary dilation. Pancreas: No focal mass lesion. No dilatation of the main duct. No intraparenchymal cyst. No peripancreatic edema. Spleen: No splenomegaly. No focal mass lesion. Adrenals/Urinary Tract: No adrenal nodule or mass. Small cyst noted both kidneys. No  followup imaging is recommended. 1.8 x 1.9 cm enhancing lesion identified medial interpolar left kidney (image 80/5) this cannot definitely be seen to arise from the cortex although renal cell carcinoma is a concern. Transitional cell/urothelial neoplasm also a consideration. No evidence for hydroureter. The urinary bladder appears normal for the degree of distention. Midline posterior bladder diverticulum evident.  Stomach/Bowel: Stomach is distended with fluid. Duodenum is normally positioned as is the ligament of Treitz. Small bowel loops in the abdomen and pelvis are dilated up to 3.6 cm diameter. Small bowel in the right pelvis shows fecalization of enteric contents suggesting decreased transit. This is just proximal to what appears to be a enterocolic anastomosis (image 122/5). An apparent blind-ending small bowel structure with associated staple line again noted and 3.8 x 2.2 cm enterolith/fecalith (image 127/5). This is filled with gas and debris in the lumen with ill-defined wall and adjacent edema/inflammation, similar to prior study. No small bowel pneumatosis or substantial small bowel wall thickening. No definite areas of small-bowel wall non enhancement. No gross colonic mass. No colonic wall thickening. Diverticular changes noted left colon without diverticulitis. Lymphatic: A collar of soft tissue attenuation in cases the infrarenal abdominal aorta and tracks down along the right common iliac artery. No pelvic sidewall lymphadenopathy. Reproductive: The prostate gland and seminal vesicles are unremarkable. Other: Vascular congestion with interloop mesenteric fluid noted in the small bowel mesentery subtending the dilated small bowel loops. Small volume free fluid noted around the liver and spleen with small volume free fluid in the pelvis. Musculoskeletal: Left groin hernia contains only fat. No worrisome lytic or sclerotic osseous abnormality. Lumbosacral fusion hardware evident. IMPRESSION: 1. 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 Appearance is not characteristic for median arcuate ligament mass-effect although no appreciable atherosclerotic plaque is seen at this level. Celiac lumen is well opacified distal to the stenosis with normal opacification of the common hepatic artery and splenic artery. 2. A collar of soft tissue attenuation in cases the infrarenal abdominal aorta  and tracks down along the right common iliac artery. Imaging features could reflect vasculitis or retroperitoneal fibrosis. 3. Interval progression of small bowel dilatation since 02/10/2023. Dilated small bowel in the right pelvis demonstrates fecalization of enteric contents suggesting decreased transit and small bowel obstruction. This dilated abnormal appearing loop of small bowel appears to track into the ileocolic anastomosis. 4. Vascular congestion with interloop mesenteric fluid seen in small bowel mesentery of the abdomen and pelvis. There is no overt small bowel wall thickening and no discernible small bowel pneumatosis to suggest overt small bowel ischemia on this study. No portal venous gas. 5. Apparent blind-ending small bowel structure with associated staple line and 3.8 x 2.2 cm enterolith/fecalith. This may represent a blind-ending stump related to anastomosis given the extensive bowel surgery noted in the right lower quadrant. This appearance has been present on studies dating back to 10/05/2016 although ill-defined wall thickening is new since prior studies. Infectious/inflammatory enteritis would be a consideration. Ischemia considered less likely but not excluded. 6. 1.8 x 1.9 cm enhancing lesion medial interpolar left kidney cannot definitely be seen to arise from the cortex although renal cell carcinoma is a consideration. Transitional cell/urothelial neoplasm also a consideration. MRI of the abdomen with and without contrast recommended to further evaluate. 7. Tiny bilateral pleural effusions with dependent atelectasis. 8. Left groin hernia contains only fat. Electronically Signed   By: Misty Stanley M.D.   On: 02/17/2023 13:58    Anti-infectives: Anti-infectives (From  admission, onward)    Start     Dose/Rate Route Frequency Ordered Stop   02/17/23 2245  ciprofloxacin (CIPRO) IVPB 400 mg  Status:  Discontinued        400 mg 200 mL/hr over 60 Minutes Intravenous Every 12 hours 02/17/23  2156 02/18/23 1348   02/17/23 2245  metroNIDAZOLE (FLAGYL) IVPB 500 mg  Status:  Discontinued        500 mg 100 mL/hr over 60 Minutes Intravenous Every 12 hours 02/17/23 2156 02/18/23 1348   02/17/23 2200  ciprofloxacin (CIPRO) tablet 500 mg  Status:  Discontinued        500 mg Per Tube 2 times daily 02/17/23 1537 02/17/23 2156   02/17/23 2200  metroNIDAZOLE (FLAGYL) tablet 500 mg  Status:  Discontinued        500 mg Per Tube Every 12 hours 02/17/23 1537 02/17/23 2156        Assessment/Plan pSBO H/o R colectomy 1993 [facility] (admitted 3/2-3/6)  - NGT in place with 800 ml/24h charted, he has had several BMs - am xray with less SB distension. Contrast previously seen through colon - distension improved but mild nausea this am - leukocytosis resolved - no acute surgical intervention indicated at this time - clamp NGT this am and try CLD. Back to [facility] for n/v/worsening abdominal pain or distension  FEN: NGT clamped. 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    LOS: 2 days   [facility] 02/19/2023, 8:44 AM Please see Amion for pager number during day hours 7:00am-4:30pm

## 2023-02-20 ENCOUNTER — Inpatient Hospital Stay (HOSPITAL_COMMUNITY): Payer: PPO

## 2023-02-20 DIAGNOSIS — K56609 Unspecified intestinal obstruction, unspecified as to partial versus complete obstruction: Secondary | ICD-10-CM | POA: Diagnosis not present

## 2023-02-20 LAB — MAGNESIUM: Magnesium: 1.8 mg/dL (ref 1.7–2.4)

## 2023-02-20 MED ORDER — POTASSIUM CHLORIDE CRYS ER 20 MEQ PO TBCR
40.0000 meq | EXTENDED_RELEASE_TABLET | ORAL | Status: AC
  Administered 2023-02-20 (×2): 40 meq via ORAL
  Filled 2023-02-20 (×2): qty 2

## 2023-02-20 NOTE — Progress Notes (Signed)
Progress Note     Subjective: NGT out and tolerated a small amount of clears yesterday. Still passing flatus and BM last night. Still distended but not worse from yesterday and no n/v  Objective: Vital signs in last 24 hours: Temp:  [98.3 F (36.8 C)-98.6 F (37 C)] 98.3 F (36.8 C) (03/13 0556) Pulse Rate:  [69-78] 69 (03/13 0556) Resp:  [18] 18 (03/13 0556) BP: (146-148)/(57-69) 146/69 (03/13 0556) SpO2:  [96 %-98 %] 97 % (03/13 0556) Last BM Date : 02/19/23  Intake/Output from previous day: 03/12 0701 - 03/13 0700 In: 470 [P.O.:470] Out: 400 [Urine:400] Intake/Output this shift: No intake/output data recorded.  PE: General: pleasant, WD, male who is sitting up in chair in NAD Lungs: Respiratory effort nonlabored on room air Abd: soft, NT, +BS. Mildly distended.  Skin: warm and dry Psych: A&Ox3 with an appropriate affect.    Lab Results:  Recent Labs    02/18/23 0357 02/19/23 0432  WBC 9.8 7.9  HGB 9.3* 8.9*  HCT 29.2* 28.4*  PLT 409* 347    BMET Recent Labs    02/18/23 0357 02/19/23 0432  NA 137 140  K 3.5 3.4*  CL 111 105  CO2 23 24  GLUCOSE 113* 89  BUN 17 18  CREATININE 1.16 0.90  CALCIUM 8.1* 8.3*    PT/INR No results for input(s): "LABPROT", "INR" in the last 72 hours. CMP     Component Value Date/Time   NA 140 02/19/2023 0432   K 3.4 (L) 02/19/2023 0432   CL 105 02/19/2023 0432   CO2 24 02/19/2023 0432   GLUCOSE 89 02/19/2023 0432   BUN 18 02/19/2023 0432   CREATININE 0.90 02/19/2023 0432   CALCIUM 8.3 (L) 02/19/2023 0432   PROT 7.5 02/17/2023 1146   ALBUMIN 3.4 (L) 02/17/2023 1146   AST 41 02/17/2023 1146   ALT 25 02/17/2023 1146   ALKPHOS 54 02/17/2023 1146   BILITOT 0.8 02/17/2023 1146   GFRNONAA >60 02/19/2023 0432   GFRAA >90 07/25/2014 0542   Lipase     Component Value Date/Time   LIPASE 32 02/17/2023 1146       Studies/Results: DG Abd Portable 1V  Result Date: 02/19/2023 CLINICAL DATA:  Small bowel  obstruction. EXAM: PORTABLE ABDOMEN - 1 VIEW COMPARISON:  02/18/19 for FINDINGS: Enteric tube tip and side port are in the stomach. There is been interval decompression of previously dilated loops of small bowel. Gas and enteric contrast material is noted within the colon. Decreased from previous exam. Postop change within the lumbar spine is again noted. IMPRESSION: 1. Interval decompression of previously dilated loops of small bowel. 2. Enteric tube tip and side port are in the stomach. Electronically Signed   By: Kerby Moors M.D.   On: 02/19/2023 07:31    Anti-infectives: Anti-infectives (From admission, onward)    Start     Dose/Rate Route Frequency Ordered Stop   02/17/23 2245  ciprofloxacin (CIPRO) IVPB 400 mg  Status:  Discontinued        400 mg 200 mL/hr over 60 Minutes Intravenous Every 12 hours 02/17/23 2156 02/18/23 1348   02/17/23 2245  metroNIDAZOLE (FLAGYL) IVPB 500 mg  Status:  Discontinued        500 mg 100 mL/hr over 60 Minutes Intravenous Every 12 hours 02/17/23 2156 02/18/23 1348   02/17/23 2200  ciprofloxacin (CIPRO) tablet 500 mg  Status:  Discontinued        500 mg Per Tube 2 times daily  02/17/23 1537 02/17/23 2156   02/17/23 2200  metroNIDAZOLE (FLAGYL) tablet 500 mg  Status:  Discontinued        500 mg Per Tube Every 12 hours 02/17/23 1537 02/17/23 2156        Assessment/Plan pSBO H/o R colectomy 1993 [facility] (admitted 3/2-3/6)  - NGT out yesterday - am xray with non obstructive bowel gas pattern. Contrast previously seen through colon - distension improved, tolerating CLD - leukocytosis resolved - no acute surgical intervention indicated at this time - advance to FLD this am. Maybe be able to advance to soft today pending tolerance and stable for dc home from surgical standpoint once tolerating soft  FEN: FLD ID: cipro/flagyl for enteritis VTE: okay for chemical ppx from surgical perspective  Per primary HTN HLD BPH Celiac artery stenosis -  there is distal opacification  I reviewed hospitalist notes, last 24 h vitals and pain scores, last 48 h intake and output, last 24 h labs and trends, and last 24 h imaging results.    LOS: 3 days   [facility] 02/20/2023, 7:39 AM Please see Amion for pager number during day hours 7:00am-4:30pm

## 2023-02-20 NOTE — Progress Notes (Signed)
[facility] Progress Note  Patient: Cory Bright     [id number]  DOA: 02/17/2023   PCP: Velna Hatchet, MD       Brief hospital course: 87 y/o with colon cancer s/p a colectomy in the 90s presents for abdominal pain that was so severe that he had cold sweats.  He was admitted from 3/2 through 3/6 with colitis and treated with ciprofloxacin and Flagyl. In the ED, he was found to have small bowel obstruction and an NG tube was placed.   Subjective:  Tolerating clear liquids.  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   - symptomatic     Code Status: DNR Consultants: general surgery Level of Care: Level of care: Med-Surg Total time on patient care: 35 DVT prophylaxis:  SCDs Start: 02/17/23 1537     Objective:   Vitals:   02/19/23 1212 02/19/23 2103 02/20/23 0556 02/20/23 1244  BP: (!) 147/57 (!) 148/65 (!) 146/69 124/62  Pulse: 78 74 69 71  Resp: '18 18 18 17  '$ Temp: 98.3 F (36.8 C) 98.6 F (37 C) 98.3 F (36.8 C) 98.6 F (37 C)  TempSrc: Oral Oral Oral Oral  SpO2: 98% 96% 97% 95%  Weight:      Height:       Filed Weights   02/17/23 1117  Weight: 65 kg   Exam: General exam: Appears comfortable  HEENT: oral mucosa moist Respiratory system: Clear to auscultation.  Cardiovascular system: S1 & S2 heard  Gastrointestinal system: Abdomen soft, non-tender, nondistended. Normal bowel sounds   Extremities: No cyanosis, clubbing or edema Psychiatry:  Mood & affect appropriate.       CBC: Recent Labs  Lab 02/17/23 1146 02/18/23 0357 02/19/23 0432  WBC 10.6* 9.8 7.9  NEUTROABS 7.8*  --   --   HGB 10.7* 9.3* 8.9*  HCT 33.2* 29.2* 28.4*  MCV 92.2 92.7 91.9  PLT 408* 409* [id number]   Basic Metabolic Panel: Recent Labs  Lab 02/17/23 1146 02/18/23 0357 02/19/23 0432  NA 139 137 140  K 4.1 3.5 3.4*  CL 105 111 105  CO2 '25 23 24  '$ GLUCOSE 137* 113* 89  BUN '15 17 18  '$ CREATININE 1.37* 1.16 0.90  CALCIUM 8.7* 8.1* 8.3*   GFR: Estimated Creatinine Clearance: 56.2 mL/min (by C-G formula based on SCr of 0.9 mg/dL).  Scheduled Meds:  gabapentin  300 mg Per Tube Daily   And   gabapentin  600 mg Per Tube QHS   lisinopril  20 mg Oral Daily   pravastatin  40 mg Oral QPM   tamsulosin  0.4 mg Oral Daily   Continuous Infusions: Imaging and lab data was personally reviewed DG Abd Portable 1V  Result Date: 02/20/2023 CLINICAL DATA:  87 year old male with history of small-bowel obstruction EXAM: PORTABLE ABDOMEN - 1 VIEW COMPARISON:  None Available. FINDINGS: The bowel gas pattern is normal. Surgical chain sutures in quadrant. No radio-opaque calculi or other significant radiographic abnormality are seen. Unchanged appearance L4-L5 PSIF without complicating features IMPRESSION: 1. Nonobstructive bowel gas pattern. 2.  Unchanged appearance of L4-L5 PSIF without complicating features. Electronically Signed   By: Ruthann Cancer M.D.   On: 02/20/2023 08:11   DG Abd Portable 1V  Result Date: 02/19/2023 CLINICAL DATA:  Small bowel obstruction. EXAM: PORTABLE ABDOMEN - 1 VIEW COMPARISON:  02/18/19 for FINDINGS: Enteric tube tip and side port are in the stomach. There is been interval decompression of previously dilated loops of small bowel. Gas and enteric contrast material is noted within the colon. Decreased from previous exam. Postop change within the lumbar spine is again noted. IMPRESSION: 1. Interval decompression of previously dilated loops of small bowel. 2. Enteric tube  tip and side port are in the stomach. Electronically Signed   By: Kerby Moors M.D.   On: 02/19/2023 07:31    LOS: 3 days   Author: Debbe Odea  02/20/2023 2:53 PM  To contact [facility]>   Check the care team in [facility] and look for the attending/consulting [facility] provider listed  Log into [email or web address] and use [place]'s universal password   Go to> "[facility]"  and find provider  If you still have difficulty reaching the provider, please page the [facility] (Director on Call) for the Hospitalists listed on amion

## 2023-02-20 NOTE — TOC Progression Note (Deleted)
Transition of Care [facility]) - Progression Note   Patient Details  Name: Cory Bright MRN: [id number] Date of Birth: Jan 28, 1936  Transition of Care [facility]) CM/SW [place], LCSW Phone Number: 02/20/2023, 1:55 PM  Clinical Narrative: PT recommended a hospital bed and 3N1. Wife declined to private pay for a 3N1 and will buy a raised toilet seat if needed. Wife confirmed the patient will need the hospital bed. CSW made DME referral to [facility] with [employer]. Hospital bed narrative completed and order placed. [employer] to coordinate delivery with patient's wife.    Barriers to Discharge: Continued Medical Work up  Expected Discharge Plan and Services In-house Referral: Clinical Social Work           DME Arranged: Hospital bed DME Agency: [employer] Date DME Agency Contacted: 02/20/23 Time DME Agency Contacted: [id number] Representative spoke with at DME Agency: [place] ([place]) Interventions SDOH Screenings   Food Insecurity: No Food Insecurity (02/17/2023)  Housing: [place]  (02/17/2023)  Transportation Needs: No Transportation Needs (02/17/2023)  Utilities: Not At Risk (02/17/2023)  Tobacco Use: Low Risk  (02/17/2023)   Readmission Risk Interventions    02/13/2023   10:11 AM  Readmission Risk Prevention Plan  Post Dischage Appt Complete  Medication Screening Complete  Transportation Screening Complete

## 2023-02-21 LAB — BASIC METABOLIC PANEL
Anion gap: 8 (ref 5–15)
BUN: 16 mg/dL (ref 8–23)
CO2: 24 mmol/L (ref 22–32)
Calcium: 8.2 mg/dL — ABNORMAL LOW (ref 8.9–10.3)
Chloride: 105 mmol/L (ref 98–111)
Creatinine, Ser: 0.88 mg/dL (ref 0.61–1.24)
GFR, Estimated: >60 mL/min (ref >60)
Glucose, Bld: 95 mg/dL (ref 70–99)
Potassium: 4.3 mmol/L (ref 3.5–5.1)
Sodium: 137 mmol/L (ref 135–145)

## 2023-02-21 NOTE — Consult Note (Signed)
   [facility] CM Inpatient Consult   02/21/2023  JANARI YAMADA Oct 29, 1936 [id number]  [facility] [ACO] Patient: [facility] Liaison remote coverage review for patient admitted to [facility]    Primary Care Provider:  Velna Hatchet, MD with [facility]   Patient screened for less than 7 days readmission hospitalization with noted low risk score for unplanned readmission risk. Patient's electronic medical record was reviewed to assess for potential [facility] service needs for post hospital transition for care coordination.  Review of patient's electronic medical record reveals patient is with no care coordination needs noted, reviewed inpatient [facility] team notes and SDOH assessment noted [up to date]. Noted for transition home.   Plan:  Continue to follow progress and disposition to assess for post hospital community care coordination/management needs.  Referral request for community care coordination: will sign off at transition  Of note, [place] does not replace or interfere with any arrangements made by the Inpatient Transition of Care team.  For questions contact:   Natividad Brood, RN BSN [place]  [phone] business mobile phone Toll free office [phone]  *[place]  [phone] Fax number: [phone] [email or web address]

## 2023-02-21 NOTE — Discharge Summary (Signed)
Physician Discharge Summary  Cory Bright [id number] DOB: 08-Mar-1936 DOA: 02/17/2023  PCP: Velna Hatchet, MD  Admit date: 02/17/2023 Discharge date: 02/21/2023 Discharging to: home Recommendations for Outpatient Follow-up:  Consider MRI of left kidney to f/u on lesion noted on CT  Consults:  General surgery Procedures:  NG tube   Discharge Diagnoses:   Principal Problem:   SBO (small bowel obstruction) (Cory Bright) Active Problems:   HTN (hypertension)   HLD (hyperlipidemia)   BPH (benign prostatic hyperplasia)   Renal lesion   Normocytic anemia     Hospital Course:   87 y/o with colon cancer s/p a colectomy in the 90s presents for abdominal pain that was so severe that he had cold sweats.  He was admitted from 3/2 through 3/6 with colitis and treated with ciprofloxacin and Flagyl. In the ED, he was found to have small bowel obstruction and an NG tube was placed.      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   fluticasone 0.05 % cream Commonly known as: CUTIVATE Apply 1 Application  topically daily as needed (itching).   gabapentin 300 MG capsule Commonly known as: NEURONTIN TAKE ONE CAPSULE BY MOUTH EVERY MORNING AND TAKE TWO CAPSULES BY MOUTH EVERY NIGHT AT BEDTIME What changed: See the new instructions.   lisinopril 20 MG tablet Commonly known as: ZESTRIL Take 20 mg by mouth daily.   omeprazole 20 MG capsule Commonly known as: PRILOSEC Take 20 mg by mouth daily.   pravastatin 40 MG tablet Commonly known as: PRAVACHOL Take 40 mg by mouth every evening.   tamsulosin 0.4 MG Caps capsule Commonly known as: FLOMAX Take 0.4 mg by mouth daily.            The results of significant diagnostics from this hospitalization (including imaging, microbiology, ancillary and laboratory) are listed below for reference.    DG Abd Portable 1V  Result Date: 02/20/2023 CLINICAL DATA:  87 year old male with history of small-bowel obstruction EXAM: PORTABLE ABDOMEN - 1 VIEW COMPARISON:  None Available. FINDINGS: The bowel gas pattern is normal. Surgical chain sutures in quadrant. No radio-opaque calculi or other significant radiographic abnormality are seen. Unchanged appearance L4-L5 PSIF without complicating features IMPRESSION: 1. Nonobstructive bowel gas pattern. 2. Unchanged appearance of L4-L5 PSIF without complicating features. Electronically Signed   By: Ruthann Cancer M.D.   On: 02/20/2023 08:11   DG Abd Portable 1V  Result Date: 02/19/2023 CLINICAL  DATA:  Small bowel obstruction. EXAM: PORTABLE ABDOMEN - 1 VIEW COMPARISON:  02/18/19 for FINDINGS: Enteric tube tip and side port are in the stomach. There is been interval decompression of previously dilated loops of small bowel. Gas and enteric contrast material is noted within the colon. Decreased from previous exam. Postop change within the lumbar spine is again noted. IMPRESSION: 1. Interval decompression of previously dilated loops of small bowel. 2. Enteric tube tip and side port are in the stomach. Electronically  Signed   By: Kerby Moors M.D.   On: 02/19/2023 07:31   DG Abd Portable 1V-Small Bowel Obstruction Protocol-initial, 8 hr delay  Result Date: 02/18/2023 CLINICAL DATA:  Small-bowel obstruction, 8 hour delay EXAM: PORTABLE ABDOMEN - 1 VIEW COMPARISON:  Radiograph 02/17/2023, CT 02/17/2023 FINDINGS: There are persistently dilated loops of small bowel, measuring up to 3.6 cm. There is radiopaque intraluminal material within the small bowel, ascending colon, and likely the rectum. There are postsurgical changes in the right lower hemiabdomen. Right lower quadrant fecalith noted. NG tube tip and side port overlie the stomach. Degenerative changes of the spine with prior lower lumbar fusion. IMPRESSION: Persistently dilated small bowel with passage of oral contrast material into the colon, suggesting partial obstruction or ileus. Electronically Signed   By: Maurine Simmering M.D.   On: 02/18/2023 08:09   DG Abd Portable 1V-Small Bowel Protocol-Position Verification  Result Date: 02/17/2023 CLINICAL DATA:  NG placement. EXAM: PORTABLE ABDOMEN - 1 VIEW COMPARISON:  Chest radiograph dated 07/21/2014 and CT abdomen pelvis dated 02/17/2023. FINDINGS: Enteric tube with tip and side-port in the left upper abdomen, likely in the body of the stomach. Mildly dilated small bowel loops. IMPRESSION: Enteric tube in the body of the stomach. Electronically Signed   By: Anner Crete M.D.   On: 02/17/2023 20:54   DG Abdomen 1 View  Result Date: 02/17/2023 CLINICAL DATA:  NG tube placement. EXAM: ABDOMEN - 1 VIEW COMPARISON:  02/11/2023 FINDINGS: NG tube tip is in the stomach with proximal side port in the region of the GE junction. Gaseous small bowel distension noted left upper quadrant. IMPRESSION: NG tube tip is in the proximal to mid stomach with side port of the NG tube in the region of the GE junction. Tube could be advanced 3-4 cm to ensure proximal side port placement below the GE junction as clinically warranted.  Electronically Signed   By: Misty Stanley M.D.   On: 02/17/2023 15:13   CT Angio Abd/Pel W and/or Wo Contrast  Result Date: 02/17/2023 CLINICAL DATA:  Left upper quadrant pain.  Mesenteric ischemia. EXAM: CTA ABDOMEN AND PELVIS WITHOUT AND WITH CONTRAST TECHNIQUE: Multidetector CT imaging of the abdomen and pelvis was performed using the standard protocol during bolus administration of intravenous contrast. Multiplanar reconstructed images and MIPs were obtained and reviewed to evaluate the vascular anatomy. RADIATION DOSE REDUCTION: This exam was performed according to the departmental dose-optimization program which includes automated exposure control, adjustment of the mA and/or kV according to patient size and/or use of iterative reconstruction technique. CONTRAST:  176mL OMNIPAQUE IOHEXOL 350 MG/ML SOLN COMPARISON:  02/10/2023 FINDINGS: VASCULAR Aorta: Normal caliber aorta without aneurysm, dissection, vasculitis or significant stenosis. 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 Appearance is not characteristic for median arcuate ligament mass-effect although no appreciable atherosclerotic plaque is seen at this level. Celiac lumen is well opacified distal to the stenosis with normal opacification of the common hepatic artery and splenic artery.  SMA: Patent without evidence of aneurysm, dissection, vasculitis or significant stenosis. Renals: Both renal arteries are patent without evidence of aneurysm, dissection, vasculitis, fibromuscular dysplasia or significant stenosis. IMA: Patent without evidence of aneurysm, dissection, vasculitis or significant stenosis. Inflow: Patent without evidence of aneurysm, dissection, vasculitis or significant stenosis. Proximal Outflow: Bilateral common femoral and visualized portions of the superficial and profunda femoral arteries are patent without evidence of aneurysm, dissection, vasculitis or significant  stenosis. Veins: Portal vein and superior mesenteric vein are patent. No portal venous gas. No obvious venous abnormality within the limitations of this arterial phase study. Review of the MIP images confirms the above findings. NON-VASCULAR Lower chest: Tiny bilateral pleural effusions with dependent atelectasis. The heart is enlarged. Hepatobiliary: No suspicious focal abnormality within the liver parenchyma. There is no evidence for gallstones, gallbladder wall thickening, or pericholecystic fluid. No intrahepatic or extrahepatic biliary dilation. Pancreas: No focal mass lesion. No dilatation of the main duct. No intraparenchymal cyst. No peripancreatic edema. Spleen: No splenomegaly. No focal mass lesion. Adrenals/Urinary Tract: No adrenal nodule or mass. Small cyst noted both kidneys. No followup imaging is recommended. 1.8 x 1.9 cm enhancing lesion identified medial interpolar left kidney (image 80/5) this cannot definitely be seen to arise from the cortex although renal cell carcinoma is a concern. Transitional cell/urothelial neoplasm also a consideration. No evidence for hydroureter. The urinary bladder appears normal for the degree of distention. Midline posterior bladder diverticulum evident. Stomach/Bowel: Stomach is distended with fluid. Duodenum is normally positioned as is the ligament of Treitz. Small bowel loops in the abdomen and pelvis are dilated up to 3.6 cm diameter. Small bowel in the right pelvis shows fecalization of enteric contents suggesting decreased transit. This is just proximal to what appears to be a enterocolic anastomosis (image 122/5). An apparent blind-ending small bowel structure with associated staple line again noted and 3.8 x 2.2 cm enterolith/fecalith (image 127/5). This is filled with gas and debris in the lumen with ill-defined wall and adjacent edema/inflammation, similar to prior study. No small bowel pneumatosis or substantial small bowel wall thickening. No definite  areas of small-bowel wall non enhancement. No gross colonic mass. No colonic wall thickening. Diverticular changes noted left colon without diverticulitis. Lymphatic: A collar of soft tissue attenuation in cases the infrarenal abdominal aorta and tracks down along the right common iliac artery. No pelvic sidewall lymphadenopathy. Reproductive: The prostate gland and seminal vesicles are unremarkable. Other: Vascular congestion with interloop mesenteric fluid noted in the small bowel mesentery subtending the dilated small bowel loops. Small volume free fluid noted around the liver and spleen with small volume free fluid in the pelvis. Musculoskeletal: Left groin hernia contains only fat. No worrisome lytic or sclerotic osseous abnormality. Lumbosacral fusion hardware evident. IMPRESSION: 1. 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 Appearance is not characteristic for median arcuate ligament mass-effect although no appreciable atherosclerotic plaque is seen at this level. Celiac lumen is well opacified distal to the stenosis with normal opacification of the common hepatic artery and splenic artery. 2. A collar of soft tissue attenuation in cases the infrarenal abdominal aorta and tracks down along the right common iliac artery. Imaging features could reflect vasculitis or retroperitoneal fibrosis. 3. Interval progression of small bowel dilatation since 02/10/2023. Dilated small bowel in the right pelvis demonstrates fecalization of enteric contents suggesting decreased transit and small bowel obstruction. This dilated abnormal appearing loop of small bowel appears to track into the ileocolic anastomosis. 4. Vascular congestion with interloop  mesenteric fluid seen in small bowel mesentery of the abdomen and pelvis. There is no overt small bowel wall thickening and no discernible small bowel pneumatosis to suggest overt small bowel ischemia on this study. No portal venous gas. 5.  Apparent blind-ending small bowel structure with associated staple line and 3.8 x 2.2 cm enterolith/fecalith. This may represent a blind-ending stump related to anastomosis given the extensive bowel surgery noted in the right lower quadrant. This appearance has been present on studies dating back to 10/05/2016 although ill-defined wall thickening is new since prior studies. Infectious/inflammatory enteritis would be a consideration. Ischemia considered less likely but not excluded. 6. 1.8 x 1.9 cm enhancing lesion medial interpolar left kidney cannot definitely be seen to arise from the cortex although renal cell carcinoma is a consideration. Transitional cell/urothelial neoplasm also a consideration. MRI of the abdomen with and without contrast recommended to further evaluate. 7. Tiny bilateral pleural effusions with dependent atelectasis. 8. Left groin hernia contains only fat. Electronically Signed   By: Misty Stanley M.D.   On: 02/17/2023 13:58   CT ABDOMEN PELVIS W CONTRAST  Addendum Date: 02/11/2023   ADDENDUM REPORT: 02/11/2023 10:00 ADDENDUM: Again noted are postsurgical changes involving the right colon but there is new mesenteric edema particularly in the right lower quadrant near the right colon and near the distal small bowel containing the large calcified fecalith. Concern for small bowel wall thickening adjacent to the large fecalith on image 52/2. This small bowel wall thickening is near the small bowel to colonic anastomosis. Trace free fluid. Impression: New inflammatory changes in the right lower quadrant of the abdomen with probable small bowel wall thickening in the distal small bowel near the colonic anastomosis. There is also trace free fluid in the abdomen and pelvis. These results were communicated on 02/11/2023 at 9:58 am to provider Karmen Bongo, MD who acknowledged these results. Electronically Signed   By: Markus Daft M.D.   On: 02/11/2023 10:00   Result Date: 02/11/2023 CLINICAL DATA:   Right lower quadrant pain, fever EXAM: CT ABDOMEN AND PELVIS WITH CONTRAST TECHNIQUE: Multidetector CT imaging of the abdomen and pelvis was performed using the standard protocol following bolus administration of intravenous contrast. RADIATION DOSE REDUCTION: This exam was performed according to the departmental dose-optimization program which includes automated exposure control, adjustment of the mA and/or kV according to patient size and/or use of iterative reconstruction technique. CONTRAST:  167mL OMNIPAQUE IOHEXOL 300 MG/ML  SOLN COMPARISON:  10/05/2016 FINDINGS: Lower chest: No acute abnormality. Scarring in the lung bases. Coronary artery and aortic calcifications. Hepatobiliary: No focal hepatic abnormality. Gallbladder unremarkable. Pancreas: No focal abnormality or ductal dilatation. Spleen: No focal abnormality.  Normal size. Adrenals/Urinary Tract: Small scattered renal cysts bilaterally. No follow-up imaging recommended. No hydronephrosis. Adrenal glands and urinary bladder unremarkable. Stomach/Bowel: Postoperative changes in right colon. Sigmoid diverticulosis. No active diverticulitis. Large calcification again noted within a dilated distal small bowel loops is essentially unchanged when compared to prior study and dating back to 2013, likely chronic fecalith. No evidence of bowel obstruction. Vascular/Lymphatic: Aortic atherosclerosis. No aneurysm or adenopathy. Reproductive: No visible focal abnormality. Other: No free fluid or free air. Musculoskeletal: No acute bony abnormality. Postoperative changes in the lumbar spine. IMPRESSION: Stable postoperative changes in the right colon. No evidence of bowel obstruction. Chronic fecalith within a dilated distal small bowel loop, unchanged dating back to 2013. Coronary artery disease, aortic atherosclerosis. No acute findings. Electronically Signed: By: Rolm Baptise M.D. On: 02/10/2023 02:40  DG Abd 1 View  Result Date: 02/11/2023 CLINICAL DATA:   Abdominal pain. EXAM: ABDOMEN - 1 VIEW COMPARISON:  CT 02/10/2023 FINDINGS: Again noted are gas-filled loops of bowel throughout the abdomen. The degree of bowel gas distension is similar to the recent CT and there is evidence for gas in the right colon. Postsurgical changes in the lower lumbar spine. Again noted is a large calcified structure in the right lower quadrant compatible with a calcified fecalith. Postsurgical changes in the right lower quadrant of the abdomen. Probable chronic changes at the left costophrenic angle. IMPRESSION: 1. Gas-filled loops of bowel throughout the abdomen are similar to the recent CT. There is evidence for gas in the right colon. Overall, nonobstructive bowel gas pattern. 2. Review of recent CT demonstrates new inflammatory changes in the right lower quadrant with trace fluid in the abdomen and pelvis. An addendum will be made to the recent CT examination. Electronically Signed   By: Markus Daft M.D.   On: 02/11/2023 09:35   DG Chest Port 1 View  Result Date: 02/10/2023 CLINICAL DATA:  Possible sepsis, fevers EXAM: PORTABLE CHEST 1 VIEW COMPARISON:  06/22/2019 FINDINGS: Cardiac shadow is at the upper limits of normal in size. Lungs are well aerated bilaterally. No focal infiltrate or effusion is seen. No bony abnormality is noted. IMPRESSION: No active disease. Electronically Signed   By: Inez Catalina M.D.   On: 02/10/2023 02:54   Labs:   Basic Metabolic Panel: Recent Labs  Lab 02/17/23 1146 02/18/23 0357 02/19/23 0432 02/20/23 1556 02/21/23 0432  NA 139 137 140  --  137  K 4.1 3.5 3.4*  --  4.3  CL 105 111 105  --  105  CO2 25 23 24   --  24  GLUCOSE 137* 113* 89  --  95  BUN 15 17 18   --  16  CREATININE 1.37* 1.16 0.90  --  0.88  CALCIUM 8.7* 8.1* 8.3*  --  8.2*  MG  --   --   --  1.8  --      CBC: Recent Labs  Lab 02/17/23 1146 02/18/23 0357 02/19/23 0432  WBC 10.6* 9.8 7.9  NEUTROABS 7.8*  --   --   HGB 10.7* 9.3* 8.9*  HCT 33.2* 29.2* 28.4*   MCV 92.2 92.7 91.9  PLT 408* 409* 347         SIGNED:   Debbe Odea, MD  [facility] 02/21/2023, 10:19 AM

## 2023-02-21 NOTE — Care Management Important Message (Signed)
Important Message  Patient Details  Name: Cory Bright MRN: [id number] Date of Birth: 10-06-1936   Medicare Important Message Given:  Yes     Memory Argue 02/21/2023, 10:00 AM

## 2023-02-21 NOTE — Progress Notes (Signed)
Progress Note     Subjective: Sitting up eating breakfast. Tolerated soft food yesterday as well without n/v/abdominal pain. Less distended. Having bowel movements  Objective: Vital signs in last 24 hours: Temp:  [98.5 F (36.9 C)-99.1 F (37.3 C)] 98.5 F (36.9 C) (03/14 0610) Pulse Rate:  [64-73] 64 (03/14 0610) Resp:  [17-18] 18 (03/14 0610) BP: (124-156)/(51-69) 156/69 (03/14 0610) SpO2:  [95 %-96 %] 96 % (03/14 0610) Last BM Date : 02/20/23  Intake/Output from previous day: 03/13 0701 - 03/14 0700 In: 2260 [P.O.:2260] Out: -  Intake/Output this shift: No intake/output data recorded.  PE: General: pleasant, WD, male who is sitting up in bed in NAD Lungs: Respiratory effort nonlabored on room air Abd: soft, NT. Mildly distended.  Skin: warm and dry Psych: A&Ox3 with an appropriate affect.    Lab Results:  Recent Labs    02/19/23 0432  WBC 7.9  HGB 8.9*  HCT 28.4*  PLT 347    BMET Recent Labs    02/19/23 0432 02/21/23 0432  NA 140 137  K 3.4* 4.3  CL 105 105  CO2 24 24  GLUCOSE 89 95  BUN 18 16  CREATININE 0.90 0.88  CALCIUM 8.3* 8.2*    PT/INR No results for input(s): "LABPROT", "INR" in the last 72 hours. CMP     Component Value Date/Time   NA 137 02/21/2023 0432   K 4.3 02/21/2023 0432   CL 105 02/21/2023 0432   CO2 24 02/21/2023 0432   GLUCOSE 95 02/21/2023 0432   BUN 16 02/21/2023 0432   CREATININE 0.88 02/21/2023 0432   CALCIUM 8.2 (L) 02/21/2023 0432   PROT 7.5 02/17/2023 1146   ALBUMIN 3.4 (L) 02/17/2023 1146   AST 41 02/17/2023 1146   ALT 25 02/17/2023 1146   ALKPHOS 54 02/17/2023 1146   BILITOT 0.8 02/17/2023 1146   GFRNONAA >60 02/21/2023 0432   GFRAA >90 07/25/2014 0542   Lipase     Component Value Date/Time   LIPASE 32 02/17/2023 1146       Studies/Results: DG Abd Portable 1V  Result Date: 02/20/2023 CLINICAL DATA:  87 year old male with history of small-bowel obstruction EXAM: PORTABLE ABDOMEN - 1 VIEW  COMPARISON:  None Available. FINDINGS: The bowel gas pattern is normal. Surgical chain sutures in quadrant. No radio-opaque calculi or other significant radiographic abnormality are seen. Unchanged appearance L4-L5 PSIF without complicating features IMPRESSION: 1. Nonobstructive bowel gas pattern. 2. Unchanged appearance of L4-L5 PSIF without complicating features. Electronically Signed   By: Ruthann Cancer M.D.   On: 02/20/2023 08:11    Anti-infectives: Anti-infectives (From admission, onward)    Start     Dose/Rate Route Frequency Ordered Stop   02/17/23 2245  ciprofloxacin (CIPRO) IVPB 400 mg  Status:  Discontinued        400 mg 200 mL/hr over 60 Minutes Intravenous Every 12 hours 02/17/23 2156 02/18/23 1348   02/17/23 2245  metroNIDAZOLE (FLAGYL) IVPB 500 mg  Status:  Discontinued        500 mg 100 mL/hr over 60 Minutes Intravenous Every 12 hours 02/17/23 2156 02/18/23 1348   02/17/23 2200  ciprofloxacin (CIPRO) tablet 500 mg  Status:  Discontinued        500 mg Per Tube 2 times daily 02/17/23 1537 02/17/23 2156   02/17/23 2200  metroNIDAZOLE (FLAGYL) tablet 500 mg  Status:  Discontinued        500 mg Per Tube Every 12 hours 02/17/23 1537 02/17/23 2156  Assessment/Plan pSBO H/o R colectomy 1993 [facility] Enteritis (admitted 3/2-3/6)  - tolerating soft diet - am xray 3/13 with non obstructive bowel gas pattern. Contrast previously seen through colon - distension improved - leukocytosis resolved - no acute surgical intervention indicated at this time - stable for dc home from surgical standpoint   FEN: FLD ID: cipro/flagyl for enteritis VTE: okay for chemical ppx from surgical perspective  Per primary HTN HLD BPH Celiac artery stenosis - there is distal opacification  I reviewed hospitalist notes, last 24 h vitals and pain scores, last 48 h intake and output, last 24 h labs and trends, and last 24 h imaging results.    LOS: 4 days   [facility] 02/21/2023, 7:44 AM Please see Amion for pager number during day hours 7:00am-4:30pm

## 2023-03-01 ENCOUNTER — Other Ambulatory Visit (HOSPITAL_COMMUNITY): Payer: Self-pay | Admitting: Adult Health

## 2023-03-01 DIAGNOSIS — N289 Disorder of kidney and ureter, unspecified: Secondary | ICD-10-CM | POA: Diagnosis not present

## 2023-03-01 DIAGNOSIS — D649 Anemia, unspecified: Secondary | ICD-10-CM | POA: Diagnosis not present

## 2023-03-01 DIAGNOSIS — Z85038 Personal history of other malignant neoplasm of large intestine: Secondary | ICD-10-CM | POA: Diagnosis not present

## 2023-03-01 DIAGNOSIS — K5649 Other impaction of intestine: Secondary | ICD-10-CM

## 2023-03-01 DIAGNOSIS — K56609 Unspecified intestinal obstruction, unspecified as to partial versus complete obstruction: Secondary | ICD-10-CM | POA: Diagnosis not present

## 2023-03-01 DIAGNOSIS — I10 Essential (primary) hypertension: Secondary | ICD-10-CM | POA: Diagnosis not present

## 2023-03-01 DIAGNOSIS — I771 Stricture of artery: Secondary | ICD-10-CM | POA: Diagnosis not present

## 2023-03-02 ENCOUNTER — Ambulatory Visit (HOSPITAL_COMMUNITY)
Admission: RE | Admit: 2023-03-02 | Discharge: 2023-03-02 | Disposition: A | Discharge status: Home / Self Care | Payer: PPO | Source: Ambulatory Visit | Attending: Adult Health | Admitting: Adult Health

## 2023-03-02 DIAGNOSIS — K5649 Other impaction of intestine: Secondary | ICD-10-CM | POA: Diagnosis not present

## 2023-03-02 DIAGNOSIS — I771 Stricture of artery: Secondary | ICD-10-CM | POA: Diagnosis not present

## 2023-03-02 DIAGNOSIS — N289 Disorder of kidney and ureter, unspecified: Secondary | ICD-10-CM

## 2023-03-02 DIAGNOSIS — N281 Cyst of kidney, acquired: Secondary | ICD-10-CM | POA: Diagnosis not present

## 2023-03-02 MED ORDER — GADOBUTROL 1 MMOL/ML IV SOLN
6.5000 mL | Freq: Once | INTRAVENOUS | Status: AC | PRN
  Administered 2023-03-02: 6.5 mL via INTRAVENOUS

## 2023-03-06 DIAGNOSIS — Z125 Encounter for screening for malignant neoplasm of prostate: Secondary | ICD-10-CM | POA: Diagnosis not present

## 2023-03-06 DIAGNOSIS — D649 Anemia, unspecified: Secondary | ICD-10-CM | POA: Diagnosis not present

## 2023-03-06 DIAGNOSIS — E785 Hyperlipidemia, unspecified: Secondary | ICD-10-CM | POA: Diagnosis not present

## 2023-03-06 DIAGNOSIS — E538 Deficiency of other specified B group vitamins: Secondary | ICD-10-CM | POA: Diagnosis not present

## 2023-03-06 DIAGNOSIS — H02831 Dermatochalasis of right upper eyelid: Secondary | ICD-10-CM | POA: Diagnosis not present

## 2023-03-06 DIAGNOSIS — Z961 Presence of intraocular lens: Secondary | ICD-10-CM | POA: Diagnosis not present

## 2023-03-06 DIAGNOSIS — K219 Gastro-esophageal reflux disease without esophagitis: Secondary | ICD-10-CM | POA: Diagnosis not present

## 2023-03-06 DIAGNOSIS — H524 Presbyopia: Secondary | ICD-10-CM | POA: Diagnosis not present

## 2023-03-06 DIAGNOSIS — H02834 Dermatochalasis of left upper eyelid: Secondary | ICD-10-CM | POA: Diagnosis not present

## 2023-03-06 DIAGNOSIS — H5203 Hypermetropia, bilateral: Secondary | ICD-10-CM | POA: Diagnosis not present

## 2023-03-07 DIAGNOSIS — D123 Benign neoplasm of transverse colon: Secondary | ICD-10-CM | POA: Diagnosis not present

## 2023-03-07 DIAGNOSIS — K5730 Diverticulosis of large intestine without perforation or abscess without bleeding: Secondary | ICD-10-CM | POA: Diagnosis not present

## 2023-03-07 DIAGNOSIS — K635 Polyp of colon: Secondary | ICD-10-CM | POA: Diagnosis not present

## 2023-03-07 DIAGNOSIS — K9189 Other postprocedural complications and disorders of digestive system: Secondary | ICD-10-CM | POA: Diagnosis not present

## 2023-03-07 DIAGNOSIS — Z8719 Personal history of other diseases of the digestive system: Secondary | ICD-10-CM | POA: Diagnosis not present

## 2023-03-07 DIAGNOSIS — K6389 Other specified diseases of intestine: Secondary | ICD-10-CM | POA: Diagnosis not present

## 2023-03-07 DIAGNOSIS — Z79899 Other long term (current) drug therapy: Secondary | ICD-10-CM | POA: Diagnosis not present

## 2023-03-07 DIAGNOSIS — I10 Essential (primary) hypertension: Secondary | ICD-10-CM | POA: Diagnosis not present

## 2023-03-07 DIAGNOSIS — R194 Change in bowel habit: Secondary | ICD-10-CM | POA: Diagnosis not present

## 2023-03-07 DIAGNOSIS — Z980 Intestinal bypass and anastomosis status: Secondary | ICD-10-CM | POA: Diagnosis not present

## 2023-03-07 DIAGNOSIS — Z9049 Acquired absence of other specified parts of digestive tract: Secondary | ICD-10-CM | POA: Diagnosis not present

## 2023-03-07 DIAGNOSIS — Z85038 Personal history of other malignant neoplasm of large intestine: Secondary | ICD-10-CM | POA: Diagnosis not present

## 2023-03-07 DIAGNOSIS — K648 Other hemorrhoids: Secondary | ICD-10-CM | POA: Diagnosis not present

## 2023-03-12 DIAGNOSIS — I951 Orthostatic hypotension: Secondary | ICD-10-CM | POA: Diagnosis not present

## 2023-03-12 DIAGNOSIS — M48061 Spinal stenosis, lumbar region without neurogenic claudication: Secondary | ICD-10-CM | POA: Diagnosis not present

## 2023-03-12 DIAGNOSIS — Z0000 Encounter for general adult medical examination without abnormal findings: Secondary | ICD-10-CM | POA: Diagnosis not present

## 2023-03-12 DIAGNOSIS — N289 Disorder of kidney and ureter, unspecified: Secondary | ICD-10-CM | POA: Diagnosis not present

## 2023-03-12 DIAGNOSIS — Z1331 Encounter for screening for depression: Secondary | ICD-10-CM | POA: Diagnosis not present

## 2023-03-12 DIAGNOSIS — M5412 Radiculopathy, cervical region: Secondary | ICD-10-CM | POA: Diagnosis not present

## 2023-03-12 DIAGNOSIS — I10 Essential (primary) hypertension: Secondary | ICD-10-CM | POA: Diagnosis not present

## 2023-03-12 DIAGNOSIS — E538 Deficiency of other specified B group vitamins: Secondary | ICD-10-CM | POA: Diagnosis not present

## 2023-03-12 DIAGNOSIS — E785 Hyperlipidemia, unspecified: Secondary | ICD-10-CM | POA: Diagnosis not present

## 2023-03-12 DIAGNOSIS — Z1339 Encounter for screening examination for other mental health and behavioral disorders: Secondary | ICD-10-CM | POA: Diagnosis not present

## 2023-03-12 DIAGNOSIS — Z85038 Personal history of other malignant neoplasm of large intestine: Secondary | ICD-10-CM | POA: Diagnosis not present

## 2023-03-12 DIAGNOSIS — I771 Stricture of artery: Secondary | ICD-10-CM | POA: Diagnosis not present

## 2023-03-14 DIAGNOSIS — N261 Atrophy of kidney (terminal): Secondary | ICD-10-CM | POA: Diagnosis not present

## 2023-03-14 DIAGNOSIS — N2889 Other specified disorders of kidney and ureter: Secondary | ICD-10-CM | POA: Diagnosis not present

## 2023-03-14 DIAGNOSIS — Z9049 Acquired absence of other specified parts of digestive tract: Secondary | ICD-10-CM | POA: Diagnosis not present

## 2023-03-14 DIAGNOSIS — Z85528 Personal history of other malignant neoplasm of kidney: Secondary | ICD-10-CM | POA: Diagnosis not present

## 2023-03-21 DIAGNOSIS — N401 Enlarged prostate with lower urinary tract symptoms: Secondary | ICD-10-CM | POA: Diagnosis not present

## 2023-03-21 DIAGNOSIS — N2889 Other specified disorders of kidney and ureter: Secondary | ICD-10-CM | POA: Diagnosis not present

## 2023-03-21 DIAGNOSIS — N138 Other obstructive and reflux uropathy: Secondary | ICD-10-CM | POA: Diagnosis not present

## 2023-04-01 DIAGNOSIS — L259 Unspecified contact dermatitis, unspecified cause: Secondary | ICD-10-CM | POA: Diagnosis not present

## 2023-05-09 DIAGNOSIS — S51802A Unspecified open wound of left forearm, initial encounter: Secondary | ICD-10-CM | POA: Diagnosis not present

## 2023-05-09 DIAGNOSIS — W19XXXA Unspecified fall, initial encounter: Secondary | ICD-10-CM | POA: Diagnosis not present

## 2023-05-09 DIAGNOSIS — S0181XA Laceration without foreign body of other part of head, initial encounter: Secondary | ICD-10-CM | POA: Diagnosis not present

## 2023-06-17 DIAGNOSIS — I700 Atherosclerosis of aorta: Secondary | ICD-10-CM | POA: Diagnosis not present

## 2023-06-17 DIAGNOSIS — K8689 Other specified diseases of pancreas: Secondary | ICD-10-CM | POA: Diagnosis not present

## 2023-06-17 DIAGNOSIS — K769 Liver disease, unspecified: Secondary | ICD-10-CM | POA: Diagnosis not present

## 2023-09-04 DIAGNOSIS — D225 Melanocytic nevi of trunk: Secondary | ICD-10-CM | POA: Diagnosis not present

## 2023-09-04 DIAGNOSIS — L814 Other melanin hyperpigmentation: Secondary | ICD-10-CM | POA: Diagnosis not present

## 2023-09-04 DIAGNOSIS — L821 Other seborrheic keratosis: Secondary | ICD-10-CM | POA: Diagnosis not present

## 2023-09-04 DIAGNOSIS — L304 Erythema intertrigo: Secondary | ICD-10-CM | POA: Diagnosis not present

## 2023-09-04 DIAGNOSIS — L720 Epidermal cyst: Secondary | ICD-10-CM | POA: Diagnosis not present

## 2023-09-11 DIAGNOSIS — K769 Liver disease, unspecified: Secondary | ICD-10-CM | POA: Diagnosis not present

## 2023-09-11 DIAGNOSIS — R972 Elevated prostate specific antigen [PSA]: Secondary | ICD-10-CM | POA: Diagnosis not present

## 2023-09-11 DIAGNOSIS — K219 Gastro-esophageal reflux disease without esophagitis: Secondary | ICD-10-CM | POA: Diagnosis not present

## 2023-09-11 DIAGNOSIS — R040 Epistaxis: Secondary | ICD-10-CM | POA: Diagnosis not present

## 2023-09-11 DIAGNOSIS — M5412 Radiculopathy, cervical region: Secondary | ICD-10-CM | POA: Diagnosis not present

## 2023-09-11 DIAGNOSIS — Z85038 Personal history of other malignant neoplasm of large intestine: Secondary | ICD-10-CM | POA: Diagnosis not present

## 2023-09-11 DIAGNOSIS — C642 Malignant neoplasm of left kidney, except renal pelvis: Secondary | ICD-10-CM | POA: Diagnosis not present

## 2023-09-11 DIAGNOSIS — E785 Hyperlipidemia, unspecified: Secondary | ICD-10-CM | POA: Diagnosis not present

## 2023-09-11 DIAGNOSIS — I951 Orthostatic hypotension: Secondary | ICD-10-CM | POA: Diagnosis not present

## 2023-09-11 DIAGNOSIS — E538 Deficiency of other specified B group vitamins: Secondary | ICD-10-CM | POA: Diagnosis not present

## 2023-09-11 DIAGNOSIS — I10 Essential (primary) hypertension: Secondary | ICD-10-CM | POA: Diagnosis not present

## 2023-09-11 DIAGNOSIS — I771 Stricture of artery: Secondary | ICD-10-CM | POA: Diagnosis not present

## 2023-10-03 DIAGNOSIS — N401 Enlarged prostate with lower urinary tract symptoms: Secondary | ICD-10-CM | POA: Diagnosis not present

## 2023-10-03 DIAGNOSIS — N2889 Other specified disorders of kidney and ureter: Secondary | ICD-10-CM | POA: Diagnosis not present

## 2023-10-03 DIAGNOSIS — N138 Other obstructive and reflux uropathy: Secondary | ICD-10-CM | POA: Diagnosis not present

## 2023-10-03 DIAGNOSIS — N289 Disorder of kidney and ureter, unspecified: Secondary | ICD-10-CM | POA: Diagnosis not present

## 2024-02-20 ENCOUNTER — Encounter (HOSPITAL_COMMUNITY): Payer: Self-pay | Admitting: Emergency Medicine

## 2024-02-20 ENCOUNTER — Emergency Department (HOSPITAL_COMMUNITY)

## 2024-02-20 ENCOUNTER — Other Ambulatory Visit: Payer: Self-pay

## 2024-02-20 ENCOUNTER — Inpatient Hospital Stay (HOSPITAL_COMMUNITY)

## 2024-02-20 ENCOUNTER — Inpatient Hospital Stay (HOSPITAL_COMMUNITY)
Admission: EM | Admit: 2024-02-20 | Discharge: 2024-02-22 | DRG: 389 | Disposition: A | Discharge status: Home / Self Care | Attending: Family Medicine | Admitting: Family Medicine

## 2024-02-20 DIAGNOSIS — K5669 Other partial intestinal obstruction: Secondary | ICD-10-CM | POA: Diagnosis not present

## 2024-02-20 DIAGNOSIS — N289 Disorder of kidney and ureter, unspecified: Secondary | ICD-10-CM | POA: Diagnosis not present

## 2024-02-20 DIAGNOSIS — Z79899 Other long term (current) drug therapy: Secondary | ICD-10-CM

## 2024-02-20 DIAGNOSIS — K56600 Partial intestinal obstruction, unspecified as to cause: Principal | ICD-10-CM | POA: Diagnosis present

## 2024-02-20 DIAGNOSIS — Z961 Presence of intraocular lens: Secondary | ICD-10-CM | POA: Diagnosis not present

## 2024-02-20 DIAGNOSIS — D649 Anemia, unspecified: Secondary | ICD-10-CM | POA: Diagnosis present

## 2024-02-20 DIAGNOSIS — N179 Acute kidney failure, unspecified: Secondary | ICD-10-CM | POA: Diagnosis not present

## 2024-02-20 DIAGNOSIS — I774 Celiac artery compression syndrome: Secondary | ICD-10-CM | POA: Diagnosis present

## 2024-02-20 DIAGNOSIS — R55 Syncope and collapse: Secondary | ICD-10-CM | POA: Diagnosis not present

## 2024-02-20 DIAGNOSIS — K56609 Unspecified intestinal obstruction, unspecified as to partial versus complete obstruction: Principal | ICD-10-CM

## 2024-02-20 DIAGNOSIS — I700 Atherosclerosis of aorta: Secondary | ICD-10-CM | POA: Diagnosis present

## 2024-02-20 DIAGNOSIS — N400 Enlarged prostate without lower urinary tract symptoms: Secondary | ICD-10-CM | POA: Diagnosis not present

## 2024-02-20 DIAGNOSIS — K529 Noninfective gastroenteritis and colitis, unspecified: Secondary | ICD-10-CM | POA: Diagnosis present

## 2024-02-20 DIAGNOSIS — I10 Essential (primary) hypertension: Secondary | ICD-10-CM | POA: Diagnosis present

## 2024-02-20 DIAGNOSIS — K4090 Unilateral inguinal hernia, without obstruction or gangrene, not specified as recurrent: Secondary | ICD-10-CM | POA: Diagnosis not present

## 2024-02-20 DIAGNOSIS — Z1152 Encounter for screening for COVID-19: Secondary | ICD-10-CM | POA: Diagnosis not present

## 2024-02-20 DIAGNOSIS — E860 Dehydration: Secondary | ICD-10-CM | POA: Diagnosis present

## 2024-02-20 DIAGNOSIS — Z4682 Encounter for fitting and adjustment of non-vascular catheter: Secondary | ICD-10-CM | POA: Diagnosis not present

## 2024-02-20 DIAGNOSIS — E785 Hyperlipidemia, unspecified: Secondary | ICD-10-CM | POA: Diagnosis present

## 2024-02-20 DIAGNOSIS — Z9049 Acquired absence of other specified parts of digestive tract: Secondary | ICD-10-CM

## 2024-02-20 DIAGNOSIS — Z885 Allergy status to narcotic agent status: Secondary | ICD-10-CM | POA: Diagnosis not present

## 2024-02-20 DIAGNOSIS — Z85528 Personal history of other malignant neoplasm of kidney: Secondary | ICD-10-CM | POA: Diagnosis not present

## 2024-02-20 DIAGNOSIS — N281 Cyst of kidney, acquired: Secondary | ICD-10-CM | POA: Diagnosis not present

## 2024-02-20 DIAGNOSIS — T85528A Displacement of other gastrointestinal prosthetic devices, implants and grafts, initial encounter: Secondary | ICD-10-CM | POA: Diagnosis not present

## 2024-02-20 DIAGNOSIS — R109 Unspecified abdominal pain: Secondary | ICD-10-CM | POA: Diagnosis not present

## 2024-02-20 LAB — COMPREHENSIVE METABOLIC PANEL
ALT: 11 U/L (ref 0–44)
AST: 23 U/L (ref 15–41)
Albumin: 3.4 g/dL — ABNORMAL LOW (ref 3.5–5.0)
Alkaline Phosphatase: 62 U/L (ref 38–126)
Anion gap: 8 (ref 5–15)
BUN: 34 mg/dL — ABNORMAL HIGH (ref 8–23)
CO2: 23 mmol/L (ref 22–32)
Calcium: 8.8 mg/dL — ABNORMAL LOW (ref 8.9–10.3)
Chloride: 107 mmol/L (ref 98–111)
Creatinine, Ser: 1.6 mg/dL — ABNORMAL HIGH (ref 0.61–1.24)
GFR, Estimated: 42 mL/min — ABNORMAL LOW (ref >60)
Glucose, Bld: 112 mg/dL — ABNORMAL HIGH (ref 70–99)
Potassium: 3.7 mmol/L (ref 3.5–5.1)
Sodium: 138 mmol/L (ref 135–145)
Total Bilirubin: 0.5 mg/dL (ref 0.0–1.2)
Total Protein: 7.2 g/dL (ref 6.5–8.1)

## 2024-02-20 LAB — CBC
HCT: 34.3 % — ABNORMAL LOW (ref 39.0–52.0)
HCT: 27.9 % — ABNORMAL LOW (ref 39.0–52.0)
Hemoglobin: 10.5 g/dL — ABNORMAL LOW (ref 13.0–17.0)
Hemoglobin: 8.9 g/dL — ABNORMAL LOW (ref 13.0–17.0)
MCH: 29.7 pg (ref 26.0–34.0)
MCH: 30 pg (ref 26.0–34.0)
MCHC: 30.6 g/dL (ref 30.0–36.0)
MCHC: 31.9 g/dL (ref 30.0–36.0)
MCV: 96.9 fL (ref 80.0–100.0)
MCV: 93.9 fL (ref 80.0–100.0)
Platelets: 340 10*3/uL (ref 150–400)
Platelets: 277 10*3/uL (ref 150–400)
RBC: 3.54 MIL/uL — ABNORMAL LOW (ref 4.22–5.81)
RBC: 2.97 MIL/uL — ABNORMAL LOW (ref 4.22–5.81)
RDW: 12.9 % (ref 11.5–15.5)
RDW: 12.7 % (ref 11.5–15.5)
WBC: 9.4 10*3/uL (ref 4.0–10.5)
WBC: 8.5 10*3/uL (ref 4.0–10.5)
nRBC: 0 % (ref 0.0–0.2)
nRBC: 0 % (ref 0.0–0.2)

## 2024-02-20 LAB — SODIUM, URINE, RANDOM: Sodium, Ur: 18 mmol/L

## 2024-02-20 LAB — URINALYSIS, ROUTINE W REFLEX MICROSCOPIC
Bilirubin Urine: NEGATIVE
Glucose, UA: NEGATIVE mg/dL
Hgb urine dipstick: NEGATIVE
Ketones, ur: NEGATIVE mg/dL
Leukocytes,Ua: NEGATIVE
Nitrite: NEGATIVE
Protein, ur: 30 mg/dL — AB
Specific Gravity, Urine: 1.019 (ref 1.005–1.030)
pH: 5 (ref 5.0–8.0)

## 2024-02-20 LAB — BASIC METABOLIC PANEL
Anion gap: 7 (ref 5–15)
BUN: 33 mg/dL — ABNORMAL HIGH (ref 8–23)
CO2: 21 mmol/L — ABNORMAL LOW (ref 22–32)
Calcium: 8.2 mg/dL — ABNORMAL LOW (ref 8.9–10.3)
Chloride: 111 mmol/L (ref 98–111)
Creatinine, Ser: 1.36 mg/dL — ABNORMAL HIGH (ref 0.61–1.24)
GFR, Estimated: 51 mL/min — ABNORMAL LOW (ref >60)
Glucose, Bld: 90 mg/dL (ref 70–99)
Potassium: 3.7 mmol/L (ref 3.5–5.1)
Sodium: 139 mmol/L (ref 135–145)

## 2024-02-20 LAB — CREATININE, URINE, RANDOM: Creatinine, Urine: 291 mg/dL

## 2024-02-20 LAB — CK: Total CK: 129 U/L (ref 49–397)

## 2024-02-20 LAB — TYPE AND SCREEN
ABO/RH(D): O POS
Antibody Screen: NEGATIVE

## 2024-02-20 LAB — TROPONIN I (HIGH SENSITIVITY): Troponin I (High Sensitivity): 13 ng/L (ref <18)

## 2024-02-20 LAB — TSH: TSH: 2.666 u[IU]/mL (ref 0.350–4.500)

## 2024-02-20 LAB — RESP PANEL BY RT-PCR (RSV, FLU A&B, COVID)  RVPGX2
Influenza A by PCR: NEGATIVE
Influenza B by PCR: NEGATIVE
Resp Syncytial Virus by PCR: NEGATIVE
SARS Coronavirus 2 by RT PCR: NEGATIVE

## 2024-02-20 LAB — ABO/RH: ABO/RH(D): O POS

## 2024-02-20 LAB — PHOSPHORUS: Phosphorus: 3.5 mg/dL (ref 2.5–4.6)

## 2024-02-20 LAB — PROCALCITONIN: Procalcitonin: <0.1 ng/mL

## 2024-02-20 LAB — LIPASE, BLOOD: Lipase: 24 U/L (ref 11–51)

## 2024-02-20 LAB — MAGNESIUM: Magnesium: 1.6 mg/dL — ABNORMAL LOW (ref 1.7–2.4)

## 2024-02-20 MED ORDER — ONDANSETRON HCL 4 MG PO TABS: 4.0000 mg | ORAL_TABLET | Freq: Four times a day (QID) | ORAL | Status: DC | PRN

## 2024-02-20 MED ORDER — SODIUM CHLORIDE 0.9 % IV SOLN: INTRAVENOUS | Status: AC

## 2024-02-20 MED ORDER — IOHEXOL 300 MG/ML  SOLN
80.0000 mL | Freq: Once | INTRAMUSCULAR | Status: AC | PRN
  Administered 2024-02-20: 80 mL via INTRAVENOUS

## 2024-02-20 MED ORDER — DIATRIZOATE MEGLUMINE & SODIUM 66-10 % PO SOLN
90.0000 mL | Freq: Once | ORAL | Status: DC
  Filled 2024-02-20: qty 90

## 2024-02-20 MED ORDER — ONDANSETRON HCL 4 MG/2ML IJ SOLN: 4.0000 mg | Freq: Four times a day (QID) | INTRAMUSCULAR | Status: DC | PRN

## 2024-02-20 MED ORDER — MAGNESIUM SULFATE IN D5W 1-5 GM/100ML-% IV SOLN
1.0000 g | Freq: Once | INTRAVENOUS | Status: AC
  Administered 2024-02-20: 1 g via INTRAVENOUS
  Filled 2024-02-20: qty 100

## 2024-02-20 MED ORDER — ACETAMINOPHEN 650 MG RE SUPP: 650.0000 mg | Freq: Four times a day (QID) | RECTAL | Status: DC | PRN

## 2024-02-20 MED ORDER — SODIUM CHLORIDE 0.9 % IV BOLUS
1000.0000 mL | Freq: Once | INTRAVENOUS | Status: AC
  Administered 2024-02-20: 1000 mL via INTRAVENOUS

## 2024-02-20 MED ORDER — ACETAMINOPHEN 325 MG PO TABS
650.0000 mg | ORAL_TABLET | Freq: Four times a day (QID) | ORAL | Status: DC | PRN
  Administered 2024-02-22: 650 mg via ORAL
  Filled 2024-02-20: qty 2

## 2024-02-20 MED ORDER — ONDANSETRON HCL 4 MG/2ML IJ SOLN
4.0000 mg | Freq: Once | INTRAMUSCULAR | Status: AC
  Administered 2024-02-20: 4 mg via INTRAVENOUS
  Filled 2024-02-20: qty 2

## 2024-02-20 NOTE — Assessment & Plan Note (Signed)
 Likely cause  unknown  - admit for conservative management  - NG tube - NPO - KUB in AM - appreciate General surgery consult. Appreciate GI consult

## 2024-02-20 NOTE — ED Provider Notes (Addendum)
 Received signout from previous provider, please see his note for complete H&P.  88 year old male past history of abdominal surgery status post small bowel obstruction hypertension hyperlipidemia, colitis who presents with complaint of abdominal pain.  Patient has been ongoing abdominal pain with associate nausea vomiting diarrhea for the past 2 days.  On exam patient does have some epigastric tenderness without guarding or rebound tenderness.  Bowel sounds present.  Patient has had prior endoscopy and colonoscopy done by [facility] with balloon dilatation for his SBO.  -Labs ordered, independently viewed and interpreted by me.  Labs remarkable for Cr 1.6.  IVF given.  -The patient was maintained on a cardiac monitor.  I personally viewed and interpreted the cardiac monitored which showed an underlying rhythm of: NSR -Imaging independently viewed and interpreted by me and I agree with radiologist's interpretation.  Result remarkable for abd/pelvis CT showing mild diffuse small bowel dilatation concerning for partial obstruction.  Also evidence of moderate wall thickening involving small bowel loops concerning for enteritis -This patient presents to the ED for concern of abd pain, this involves an extensive number of treatment options, and is a complaint that carries with it a high risk of complications and morbidity.  The differential diagnosis includes gastroenteritis, colitis, diverticulitis, sbo, appendicitis, pancreatitis, malignancy -Co morbidities that complicate the patient evaluation includes sbo, colitis, cancer, anemia -Treatment includes IVF, antiemetic -Reevaluation of the patient after these medicines showed that the patient improved -PCP office notes or outside notes reviewed -Discussion with specialist GI Dr. Elnoria Howard who request NG tube and he will see pt tomorrow in the AM.  I have also consulted [facility] Dr. Adela Glimpse who agrees to admit pt.  I have also consulted with  General Surgeon Dr. Luisa Hart who agrees to be involve in pt care -Escalation to admission/observation considered: patient is agreeable with admission.   BP (!) 140/57 (BP Location: Left Arm)   Pulse 71   Temp 98.6 F (37 C) (Oral)   Resp 16   SpO2 99%   Results for orders placed or performed during the hospital encounter of 02/20/24  Lipase, blood   Collection Time: 02/20/24 11:34 AM  Result Value Ref Range   Lipase 24 11 - 51 U/L  Comprehensive metabolic panel   Collection Time: 02/20/24 11:34 AM  Result Value Ref Range   Sodium 138 135 - 145 mmol/L   Potassium 3.7 3.5 - 5.1 mmol/L   Chloride 107 98 - 111 mmol/L   CO2 23 22 - 32 mmol/L   Glucose, Bld 112 (H) 70 - 99 mg/dL   BUN 34 (H) 8 - 23 mg/dL   Creatinine, Ser 1.61 (H) 0.61 - 1.24 mg/dL   Calcium 8.8 (L) 8.9 - 10.3 mg/dL   Total Protein 7.2 6.5 - 8.1 g/dL   Albumin 3.4 (L) 3.5 - 5.0 g/dL   AST 23 15 - 41 U/L   ALT 11 0 - 44 U/L   Alkaline Phosphatase 62 38 - 126 U/L   Total Bilirubin 0.5 0.0 - 1.2 mg/dL   GFR, Estimated 42 (L) >60 mL/min   Anion gap 8 5 - 15  CBC   Collection Time: 02/20/24 11:34 AM  Result Value Ref Range   WBC 9.4 4.0 - 10.5 K/uL   RBC 3.54 (L) 4.22 - 5.81 MIL/uL   Hemoglobin 10.5 (L) 13.0 - 17.0 g/dL   HCT 09.6 (L) 04.5 - 40.9 %   MCV 96.9 80.0 - 100.0 fL   MCH 29.7 26.0 -  34.0 pg   MCHC 30.6 30.0 - 36.0 g/dL   RDW 82.9 56.2 - 13.0 %   Platelets 340 150 - 400 K/uL   nRBC 0.0 0.0 - 0.2 %  Urinalysis, Routine w reflex microscopic -Urine, Clean Catch   Collection Time: 02/20/24 11:34 AM  Result Value Ref Range   Color, Urine YELLOW YELLOW   APPearance HAZY (A) CLEAR   Specific Gravity, Urine 1.019 1.005 - 1.030   pH 5.0 5.0 - 8.0   Glucose, UA NEGATIVE NEGATIVE mg/dL   Hgb urine dipstick NEGATIVE NEGATIVE   Bilirubin Urine NEGATIVE NEGATIVE   Ketones, ur NEGATIVE NEGATIVE mg/dL   Protein, ur 30 (A) NEGATIVE mg/dL   Nitrite NEGATIVE NEGATIVE   Leukocytes,Ua NEGATIVE NEGATIVE   RBC /  HPF 0-5 0 - 5 RBC/hpf   WBC, UA 0-5 0 - 5 WBC/hpf   Bacteria, UA RARE (A) NONE SEEN   Squamous Epithelial / HPF 0-5 0 - 5 /HPF   Mucus PRESENT    Hyaline Casts, UA PRESENT   Resp panel by RT-PCR (RSV, Flu A&B, Covid) Anterior Nasal Swab   Collection Time: 02/20/24 12:50 PM   Specimen: Anterior Nasal Swab  Result Value Ref Range   SARS Coronavirus 2 by RT PCR NEGATIVE NEGATIVE   Influenza A by PCR NEGATIVE NEGATIVE   Influenza B by PCR NEGATIVE NEGATIVE   Resp Syncytial Virus by PCR NEGATIVE NEGATIVE   CT ABDOMEN PELVIS W CONTRAST Result Date: 02/20/2024 CLINICAL DATA:  Acute generalized abdominal pain. EXAM: CT ABDOMEN AND PELVIS WITH CONTRAST TECHNIQUE: Multidetector CT imaging of the abdomen and pelvis was performed using the standard protocol following bolus administration of intravenous contrast. RADIATION DOSE REDUCTION: This exam was performed according to the departmental dose-optimization program which includes automated exposure control, adjustment of the mA and/or kV according to patient size and/or use of iterative reconstruction technique. CONTRAST:  80mL OMNIPAQUE IOHEXOL 300 MG/ML  SOLN COMPARISON:  March 02, 2023.  February 17, 2023. FINDINGS: Lower chest: No acute abnormality. Hepatobiliary: No focal liver abnormality is seen. No gallstones, gallbladder wall thickening, or biliary dilatation. Pancreas: Unremarkable. No pancreatic ductal dilatation or surrounding inflammatory changes. Spleen: Normal in size without focal abnormality. Adrenals/Urinary Tract: Adrenal glands appear normal. No hydronephrosis or renal obstruction is noted. Stable bilateral renal cysts are noted for which no further follow-up is required. 2.4 x 1.8 cm enhancing exophytic mass is seen in the medial portion of left kidney again which is slightly enlarged in consistent with history of renal cell carcinoma as noted on prior MRI. Urinary bladder is unremarkable. Stomach/Bowel: Stomach is unremarkable. Extensive  postsurgical changes are seen involving the right colon and distal small bowel consistent with history of right colectomy. There is continued small bowel dilatation, but there is now noted moderate diffuse small bowel wall thickening concerning for enteritis or other inflammation. No definite colonic dilatation is noted. Stable blind-ending pouch with appendicoliths is again noted in right side of abdomen which most likely represents postsurgical change. Vascular/Lymphatic: Aortic atherosclerosis. No enlarged abdominal or pelvic lymph nodes. Stable soft tissue density seen around the aorta suggesting some degree of retroperitoneal fibrosis. Reproductive: Prostate is unremarkable. Other: Small fat containing left inguinal hernia.  No ascites. Musculoskeletal: Status post surgical posterior fusion of L4-5 with bilateral intrapedicular screw placement. No acute osseous abnormality is noted. IMPRESSION: Extensive postsurgical changes are seen involving the right colon and distal small bowel consistent with history of right colectomy. There remains mild diffuse small bowel dilatation which  extends to the surgical anastomosis in the right side of the abdomen, concerning for partial obstruction. There is the interval development of moderate wall thickening involving small bowel loops concerning for enteritis or other inflammatory process. 2.4 x 1.8 cm enhancing exophytic mass is seen in the medial portion of left kidney which is slightly enlarged compared to prior exam and consistent with renal cell carcinoma as noted on prior MRI. Aortic Atherosclerosis (ICD10-I70.0). Electronically Signed   By: Lupita Raider M.D.   On: 02/20/2024 16:56      Fayrene Helper, PA-C 02/20/24 1958    Fayrene Helper, PA-C 02/20/24 2021    Linwood Dibbles, MD 02/20/24 [phone]

## 2024-02-20 NOTE — Assessment & Plan Note (Signed)
 Allow permissive hypertension, hold lisinopril in setting of AKI

## 2024-02-20 NOTE — ED Notes (Signed)
 Waiting for xray then taking patient upstairs. RN upstairs informed.

## 2024-02-20 NOTE — Assessment & Plan Note (Signed)
 Supportive management order gastric panel.

## 2024-02-20 NOTE — ED Notes (Signed)
 Admitting doctor in the room at this time. Will place NG tube once done.

## 2024-02-20 NOTE — Assessment & Plan Note (Signed)
 Status post dilatation.

## 2024-02-20 NOTE — Subjective & Objective (Signed)
 Patient presents with abdominal pain nausea vomiting diarrhea and also became so lightheaded that he passed out on Tuesday 2 days ago.  Severe abdominal cramping syncope was after he vomited profusely.  No syncope since.  No chest pain no prior history of syncope still continues to have nausea and vomiting with profuse diarrhea  Of note history of SBO in the past and colitis

## 2024-02-20 NOTE — Assessment & Plan Note (Signed)
 Hold Pravachol while n.p.o. blood resume when able

## 2024-02-20 NOTE — ED Provider Notes (Signed)
 [name] EMERGENCY DEPARTMENT AT [facility] Provider Note   CSN: [id number] Arrival date & time: 02/20/24  1110     History  Chief Complaint  Patient presents with   Abdominal Pain   Emesis   Diarrhea    Cory Bright is a 88 y.o. male with past medical history significant for small bowel obstruction, hypertension, hyperlipidemia, previous colitis who presents with concern for abdominal pain, nausea, vomiting, diarrhea and an episode of syncope on Tuesday.  Patient reports that he was dry heaving with some severe cramping abdominal pain, had 1 episode of syncope after vomiting.  None since then.  Denies any chest pain, denies any heart palpitations.  No previous episodes of similar syncope.  Since then with ongoing nausea, vomiting, and reports high output diarrhea yesterday.   Abdominal Pain Associated symptoms: diarrhea and vomiting   Emesis Associated symptoms: abdominal pain and diarrhea   Diarrhea Associated symptoms: abdominal pain and vomiting        Home Medications Prior to Admission medications   Medication Sig Start Date End Date Taking? Authorizing Provider  fluticasone (CUTIVATE) 0.05 % cream Apply 1 Application topically daily as needed (itching).    [provider]  gabapentin (NEURONTIN) 300 MG capsule TAKE ONE CAPSULE BY MOUTH EVERY MORNING AND TAKE TWO CAPSULES BY MOUTH EVERY NIGHT AT BEDTIME Patient taking differently: Take 300-600 mg by mouth See admin instructions. Takes 300 mg in the morning and 600 mg at night. 11/05/16   Andrena Mews, DO  lisinopril (PRINIVIL,ZESTRIL) 20 MG tablet Take 20 mg by mouth daily.    [provider]  omeprazole (PRILOSEC) 20 MG capsule Take 20 mg by mouth daily.    [provider]  pravastatin (PRAVACHOL) 40 MG tablet Take 40 mg by mouth every evening.    [provider]  tamsulosin (FLOMAX) 0.4 MG CAPS capsule Take 0.4 mg by mouth daily.    [provider]   Wheat Dextrin (BENEFIBER HEALTHY SHAPE) POWD Take 2 Capfuls by mouth daily.    [provider]      Allergies    Tramadol    Review of Systems   Review of Systems  Gastrointestinal:  Positive for abdominal pain, diarrhea and vomiting.  All other systems reviewed and are negative.   Physical Exam Updated Vital Signs BP (!) 141/68   Pulse 70   Temp 98.4 F (36.9 C) (Oral)   Resp 17   SpO2 100%  Physical Exam Vitals and nursing note reviewed.  Constitutional:      General: He is not in acute distress.    Appearance: Normal appearance.  HENT:     Head: Normocephalic and atraumatic.  Eyes:     General:        Right eye: No discharge.        Left eye: No discharge.  Cardiovascular:     Rate and Rhythm: Normal rate and regular rhythm.     Heart sounds: No murmur heard.    No friction rub. No gallop.  Pulmonary:     Effort: Pulmonary effort is normal.     Breath sounds: Normal breath sounds.  Abdominal:     General: Bowel sounds are normal.     Palpations: Abdomen is soft.     Comments: Abdomen is most focally tender in the left lower quadrant versus generalized lower abdomen, no focal McBurney's point tenderness.  No rebound, rigidity, guarding.  No abdominal distention.  Skin:    General:  Skin is warm and dry.     Capillary Refill: Capillary refill takes less than 2 seconds.  Neurological:     Mental Status: He is alert and oriented to person, place, and time.  Psychiatric:        Mood and Affect: Mood normal.        Behavior: Behavior normal.     ED Results / Procedures / Treatments   Labs (all labs ordered are listed, but only abnormal results are displayed) Labs Reviewed  COMPREHENSIVE METABOLIC PANEL - Abnormal; Notable for the following components:      Result Value   Glucose, Bld 112 (*)    BUN 34 (*)    Creatinine, Ser 1.60 (*)    Calcium 8.8 (*)    Albumin 3.4 (*)    GFR, Estimated 42 (*)    All other components within normal limits  CBC  - Abnormal; Notable for the following components:   RBC 3.54 (*)    Hemoglobin 10.5 (*)    HCT 34.3 (*)    All other components within normal limits  URINALYSIS, ROUTINE W REFLEX MICROSCOPIC - Abnormal; Notable for the following components:   APPearance HAZY (*)    Protein, ur 30 (*)    Bacteria, UA RARE (*)    All other components within normal limits  RESP PANEL BY RT-PCR (RSV, FLU A&B, COVID)  [id number]  LIPASE, BLOOD    EKG EKG Interpretation Date/Time:  Thursday February 20 2024 13:06:48 EDT Ventricular Rate:  60 PR Interval:  221 QRS Duration:  94 QT Interval:  432 QTC Calculation: 432 R Axis:   114  Text Interpretation: Right and left arm electrode reversal, interpretation assumes no reversal Sinus or ectopic atrial rhythm Prolonged PR interval Confirmed by Alvester Chou [phone]) on 02/20/2024 3:21:31 PM  Radiology No results found.  Procedures Procedures    Medications Ordered in ED Medications  sodium chloride 0.9 % bolus 1,000 mL (0 mLs Intravenous Stopped 02/20/24 1441)  ondansetron (ZOFRAN) injection 4 mg (4 mg Intravenous Given 02/20/24 1306)  iohexol (OMNIPAQUE) 300 MG/ML solution 80 mL (80 mLs Intravenous Contrast Given 02/20/24 1403)    ED Course/ Medical Decision Making/ A&P                                 Medical Decision Making Amount and/or Complexity of Data Reviewed Labs: ordered. Radiology: ordered.  Risk Prescription drug management.   This patient is a 88 y.o. male  who presents to the ED for concern of abdominal pain, syncope.   Differential diagnoses prior to evaluation: The emergent differential diagnosis includes, but is not limited to,  CVA, ACS, arrhythmia, vasovagal / orthostatic hypotension, sepsis, hypoglycemia, electrolyte disturbance, respiratory failure, anemia, dehydration, heat injury, polypharmacy, malignancy, anxiety/panic attack, The causes of generalized abdominal pain include but are not limited to AAA, mesenteric ischemia,  appendicitis, diverticulitis, DKA, gastritis, gastroenteritis, AMI, nephrolithiasis, pancreatitis, peritonitis, adrenal insufficiency,lead poisoning, iron toxicity, intestinal ischemia, constipation, UTI,SBO/LBO, splenic rupture, biliary disease, IBD, IBS, PUD, or hepatitis . This is not an exhaustive differential.   Past Medical History / Co-morbidities / Social History: small bowel obstruction, hypertension, hyperlipidemia, previous colitis  Physical Exam: Physical exam performed. The pertinent findings include: Left lower quadrant percent generalized abdominal tenderness, no Murphy point tenderness, no McBurney's point tenderness.  No abdominal distention.  Normal bowel sounds throughout.  Somewhat hypertensive on arrival with elevated diastolic hypertension, 130/107, improved to 120/57  on recheck, although within the reading of 130/113.  Lab Tests/Imaging studies: I personally interpreted labs/imaging and the pertinent results include: UA with rare bacteria otherwise overall unremarkable, CBC with mild anemia, hemoglobin 10.5 but slightly improved compared to his baseline.  Lipase unremarkable.  His BMP is notable for AKI, BUN 34, creatinine 1.6 up from around 0.88 at baseline.  We will administer fluids for AKI.  RVP is negative for COVID, flu, RSV. CT abdomen pelvis is pending at time of handoff.  Cardiac monitoring: EKG obtained and interpreted by myself and attending physician which shows: NSR, no acute st-t wave changes   Medications: I ordered medication including Zofran, fluid bolus for AKI, nausea.  I have reviewed the patients home medicines and have made adjustments as needed.   3:22 PM Care of MAICOL BOWLAND transferred to [facility] Fayrene Helper and Dr. Renaye Rakers at the end of my shift as the patient will require reassessment once labs/imaging have resulted. Patient presentation, ED course, and plan of care discussed with review of all pertinent labs and imaging. Please see his/her note for  further details regarding further ED course and disposition. Plan at time of handoff is reassess after ct abdomen pelvis w contrast, likely stable for discharge with gastroenteritis. This may be altered or completely changed at the discretion of the oncoming team pending results of further workup.   Final Clinical Impression(s) / ED Diagnoses Final diagnoses:  None    Rx / DC Orders ED Discharge Orders     None         [place] 02/20/24 1523    Glendora Score, MD 02/20/24 [phone]

## 2024-02-20 NOTE — H&P (Addendum)
 Cory Bright:[id number] DOB: 1936/05/10 DOA: 02/20/2024     PCP: Alysia Penna, MD   Outpatient Specialists:  NONE CARDS:  Dr. Anne Fu   GI Dr. Areatha Keas with [facility]   Urology Dr. Earlene Plater  Patient arrived to ER on 02/20/24 at 1110 Referred by Attending No att. providers found   Patient coming from:    home Lives  With family    Chief Complaint:   Chief Complaint  Patient presents with   Abdominal Pain   Emesis   Diarrhea    HPI: Cory Bright is a 88 y.o. male with medical history significant of SBO, colitis, HTN, HLD, BPH, status post right colectomy due to colon mass, renal carcinoma,, anemia, history of celiac artery stenosis    Presented with nausea vomiting diarrhea  and syncope Patient presents with abdominal pain nausea vomiting diarrhea and also became so lightheaded that he passed out on Tuesday 2 days ago.  Severe abdominal cramping syncope was after he vomited profusely.  No syncope since.  No chest pain no prior history of syncope still continues to have nausea and vomiting with profuse diarrhea  Of note history of SBO in the past and colitis   Has been admitted about a year ago in March 2024 with colitis during that episode also was found to have small bowel obstruction and had NG tube placed which has resolved with conservative management  Patient denies any sick contacts no fevers or chills. Before symptoms started they did eat out,  he ate a vegetarian option at [employer].  Nobody ate the same thing  Patient describes syncope event as he was vomiting or dry heaving repeatedly after multiple attempts he started to feel lightheaded his wife was present he sat down and then slid down on the floor without hitting anything and immediately after that regained consciousness and started to feel better No associated chest pain  Denies significant ETOH intake  , drinks 1 glass of wine with dinner Does not smoke   Lab Results  Component  Value Date   SARSCOV2NAA NEGATIVE 02/20/2024   SARSCOV2NAA NEGATIVE 02/17/2023   SARSCOV2NAA NEGATIVE 02/10/2023    Regarding pertinent Chronic problems:     Hyperlipidemia - on statins Pravachol (pravastatin)      HTN on lisinopril      BPH - on Flomax,       Celiac artery stenosis status post dilatation   Chronic anemia - baseline hg Hemoglobin & Hematocrit 9.0 Recent Labs    02/20/24 1134  HGB 10.5*     Cancer: History of colon tumor s/p resection  History of left renal mass consistent with renal cell carcinoma followed by Urology ( watchful waiting)    While in ER:   CT scan still showing partial obstruction with enteritis Left renal cell carcinoma noted Found to be in AKI with creatinine up to 1.6 attempted to rehydrate while in ER  GI Dr. Elnoria Howard was consulted requested NG tube placement will see in a.m.   Lab Orders         Resp panel by RT-PCR (RSV, Flu A&B, Covid) Anterior Nasal Swab         Gastrointestinal Panel by PCR , Stool         Lipase, blood         Comprehensive metabolic panel         CBC         Urinalysis, Routine w reflex microscopic -Urine, Clean Catch  CK         Magnesium         Phosphorus         Osmolality, urine         Creatinine, urine, random         Sodium, urine, random         TSH         Procalcitonin         Prealbumin         Basic metabolic panel         CBC         Occult blood card to lab, stool         Osmolality       CTabd/pelvis -partial SBO enteritis and left renal carcinoma    Following Medications were ordered in ER: Medications  sodium chloride 0.9 % bolus 1,000 mL (0 mLs Intravenous Stopped 02/20/24 1441)  ondansetron (ZOFRAN) injection 4 mg (4 mg Intravenous Given 02/20/24 1306)  iohexol (OMNIPAQUE) 300 MG/ML solution 80 mL (80 mLs Intravenous Contrast Given 02/20/24 1403)    _______________________________________________________ ER Provider Called:      GI Dr. Elnoria Howard They Recommend admit to  medicine NG tube Will see in AM    General Surgeon Dr. Luisa Hart who agrees to be involve in pt care    ED Triage Vitals  Encounter Vitals Group     BP 02/20/24 1117 (!) 130/107     Systolic BP Percentile --      Diastolic BP Percentile --      Pulse Rate 02/20/24 1117 71     Resp 02/20/24 1117 17     Temp 02/20/24 1117 98.4 F (36.9 C)     Temp Source 02/20/24 1117 Oral     SpO2 02/20/24 1117 100 %     Weight --      Height --      Head Circumference --      Peak Flow --      Pain Score 02/20/24 1123 4     Pain Loc --      Pain Education --      Exclude from Growth Chart --   UEAV(40)@     _________________________________________ Significant initial  Findings: Abnormal Labs Reviewed  COMPREHENSIVE METABOLIC PANEL - Abnormal; Notable for the following components:      Result Value   Glucose, Bld 112 (*)    BUN 34 (*)    Creatinine, Ser 1.60 (*)    Calcium 8.8 (*)    Albumin 3.4 (*)    GFR, Estimated 42 (*)    All other components within normal limits  CBC - Abnormal; Notable for the following components:   RBC 3.54 (*)    Hemoglobin 10.5 (*)    HCT 34.3 (*)    All other components within normal limits  URINALYSIS, ROUTINE W REFLEX MICROSCOPIC - Abnormal; Notable for the following components:   APPearance HAZY (*)    Protein, ur 30 (*)    Bacteria, UA RARE (*)    All other components within normal limits        ECG: Ordered Personally reviewed and interpreted by me showing: HR : 60 Rhythm:  Right and left arm electrode reversal, interpretation assumes no reversal Sinus or ectopic atrial rhythm Prolonged PR interval Probable lateral infarct, age indeterminate QTC 76   \ COVID-19 Labs  No results for input(s): "DDIMER", "FERRITIN", "LDH", "CRP" in the last 72 hours.  Lab Results  Component Value Date   SARSCOV2NAA NEGATIVE 02/20/2024   SARSCOV2NAA NEGATIVE 02/17/2023   SARSCOV2NAA NEGATIVE 02/10/2023     The recent clinical data is shown  below. Vitals:   02/20/24 1330 02/20/24 1439 02/20/24 1553 02/20/24 1802  BP: (!) 120/57 (!) 141/68  (!) 140/57  Pulse: 60 70  71  Resp: 18 17  16   Temp:   97.9 F (36.6 C) 98.6 F (37 C)  TempSrc:   Oral Oral  SpO2: 99% 100%  99%    WBC     Component Value Date/Time   WBC 9.4 02/20/2024 1134   LYMPHSABS 1.5 02/17/2023 1146   MONOABS 1.0 02/17/2023 1146   EOSABS 0.1 02/17/2023 1146   BASOSABS 0.0 02/17/2023 1146       UA   no evidence of UTI      Urine analysis:    Component Value Date/Time   COLORURINE YELLOW 02/20/2024 1134   APPEARANCEUR HAZY (A) 02/20/2024 1134   LABSPEC 1.019 02/20/2024 1134   PHURINE 5.0 02/20/2024 1134   GLUCOSEU NEGATIVE 02/20/2024 1134   HGBUR NEGATIVE 02/20/2024 1134   BILIRUBINUR NEGATIVE 02/20/2024 1134   BILIRUBINUR neg 07/21/2014 1900   KETONESUR NEGATIVE 02/20/2024 1134   PROTEINUR 30 (A) 02/20/2024 1134   UROBILINOGEN 0.2 07/21/2014 2051   UROBILINOGEN 0.2 07/21/2014 1900   NITRITE NEGATIVE 02/20/2024 1134   LEUKOCYTESUR NEGATIVE 02/20/2024 1134    Results for orders placed or performed during the hospital encounter of 02/20/24  Resp panel by RT-PCR (RSV, Flu A&B, Covid) Anterior Nasal Swab     Status: None   Collection Time: 02/20/24 12:50 PM   Specimen: Anterior Nasal Swab  Result Value Ref Range Status   SARS Coronavirus 2 by RT PCR NEGATIVE NEGATIVE Final         Influenza A by PCR NEGATIVE NEGATIVE Final   Influenza B by PCR NEGATIVE NEGATIVE Final         Resp Syncytial Virus by PCR NEGATIVE NEGATIVE Final           __________________________________________________________ Recent Labs  Lab 02/20/24 1134  NA 138  K 3.7  CO2 23  GLUCOSE 112*  BUN 34*  CREATININE 1.60*  CALCIUM 8.8*    Cr  Up from baseline see below Lab Results  Component Value Date   CREATININE 1.60 (H) 02/20/2024   CREATININE 0.88 02/21/2023   CREATININE 0.90 02/19/2023    Recent Labs  Lab 02/20/24 1134  AST 23  ALT 11  ALKPHOS  62  BILITOT 0.5  PROT 7.2  ALBUMIN 3.4*   Lab Results  Component Value Date   CALCIUM 8.8 (L) 02/20/2024   Plt: Lab Results  Component Value Date   PLT 340 02/20/2024       Recent Labs  Lab 02/20/24 1134 02/20/24 2040  WBC 9.4 8.5  HGB 10.5* 8.9*  HCT 34.3* 27.9*  MCV 96.9 93.9  PLT 340 277    HG/HCT Up from baseline see below    Component Value Date/Time   HGB 8.9 (L) 02/20/2024 2040   HCT 27.9 (L) 02/20/2024 2040   MCV 93.9 02/20/2024 2040   MCV 97.5 (A) 07/21/2014 1856    Recent Labs  Lab 02/20/24 1134  LIPASE 24    _______________________________________________ Hospitalist was called for admission for SBO   The following Work up has been ordered so far:  Orders Placed This Encounter  Procedures   Resp panel by RT-PCR (RSV, Flu A&B, Covid) Anterior Nasal Swab  CT ABDOMEN PELVIS W CONTRAST   Lipase, blood   Comprehensive metabolic panel   CBC   Urinalysis, Routine w reflex microscopic -Urine, Clean Catch   Diet NPO time specified   Notify physician (specific to NG/OG)   NGT - Nasogastric Tube; Insert tube, Maintain tube in place   Flush NG/OG   Order port ABD x-ray ([id number]) to confirm tube placement prior to usage.   Consult to gastroenterology   Consult to hospitalist   ED EKG   EKG 12-Lead     OTHER Significant initial  Findings:      Cultures:    Component Value Date/Time   SDES  02/10/2023 0049    BLOOD BLOOD RIGHT FOREARM Performed at [facility], [address], [place], [place] [address]    SPECREQUEST  02/10/2023 0049    BOTTLES DRAWN AEROBIC AND ANAEROBIC Blood Culture results may not be optimal due to an inadequate volume of blood received in culture bottles Performed at [facility], [address] [place], [place] [address]    CULT  02/10/2023 0049    NO GROWTH 5 DAYS Performed at [facility], [address], [place], [place] [address]    REPTSTATUS 02/15/2023 FINAL  02/10/2023 0049     Radiological Exams on Admission: CT ABDOMEN PELVIS W CONTRAST Result Date: 02/20/2024 CLINICAL DATA:  Acute generalized abdominal pain. EXAM: CT ABDOMEN AND PELVIS WITH CONTRAST TECHNIQUE: Multidetector CT imaging of the abdomen and pelvis was performed using the standard protocol following bolus administration of intravenous contrast. RADIATION DOSE REDUCTION: This exam was performed according to the departmental dose-optimization program which includes automated exposure control, adjustment of the mA and/or kV according to patient size and/or use of iterative reconstruction technique. CONTRAST:  80mL OMNIPAQUE IOHEXOL 300 MG/ML  SOLN COMPARISON:  March 02, 2023.  February 17, 2023. FINDINGS: Lower chest: No acute abnormality. Hepatobiliary: No focal liver abnormality is seen. No gallstones, gallbladder wall thickening, or biliary dilatation. Pancreas: Unremarkable. No pancreatic ductal dilatation or surrounding inflammatory changes. Spleen: Normal in size without focal abnormality. Adrenals/Urinary Tract: Adrenal glands appear normal. No hydronephrosis or renal obstruction is noted. Stable bilateral renal cysts are noted for which no further follow-up is required. 2.4 x 1.8 cm enhancing exophytic mass is seen in the medial portion of left kidney again which is slightly enlarged in consistent with history of renal cell carcinoma as noted on prior MRI. Urinary bladder is unremarkable. Stomach/Bowel: Stomach is unremarkable. Extensive postsurgical changes are seen involving the right colon and distal small bowel consistent with history of right colectomy. There is continued small bowel dilatation, but there is now noted moderate diffuse small bowel wall thickening concerning for enteritis or other inflammation. No definite colonic dilatation is noted. Stable blind-ending pouch with appendicoliths is again noted in right side of abdomen which most likely represents postsurgical change.  Vascular/Lymphatic: Aortic atherosclerosis. No enlarged abdominal or pelvic lymph nodes. Stable soft tissue density seen around the aorta suggesting some degree of retroperitoneal fibrosis. Reproductive: Prostate is unremarkable. Other: Small fat containing left inguinal hernia.  No ascites. Musculoskeletal: Status post surgical posterior fusion of L4-5 with bilateral intrapedicular screw placement. No acute osseous abnormality is noted. IMPRESSION: Extensive postsurgical changes are seen involving the right colon and distal small bowel consistent with history of right colectomy. There remains mild diffuse small bowel dilatation which extends to the surgical anastomosis in the right side of the abdomen, concerning for partial obstruction. There is the interval development  of moderate wall thickening involving small bowel loops concerning for enteritis or other inflammatory process. 2.4 x 1.8 cm enhancing exophytic mass is seen in the medial portion of left kidney which is slightly enlarged compared to prior exam and consistent with renal cell carcinoma as noted on prior MRI. Aortic Atherosclerosis (ICD10-I70.0). Electronically Signed   By: Lupita Raider M.D.   On: 02/20/2024 16:56   _______________________________________________________________________________________________________ Latest  Blood pressure (!) 140/57, pulse 71, temperature 98.6 F (37 C), temperature source Oral, resp. rate 16, SpO2 99%.   Vitals  labs and radiology finding personally reviewed  Review of Systems:    Pertinent positives include: abdominal pain, nausea, vomiting, diarrhea  Constitutional:  No weight loss, night sweats, Fevers, chills, fatigue, weight loss  HEENT:  No headaches, Difficulty swallowing,Tooth/dental problems,Sore throat,  No sneezing, itching, ear ache, nasal congestion, post nasal drip,  Cardio-vascular:  No chest pain, Orthopnea, PND, anasarca, dizziness, palpitations.no Bilateral lower extremity  swelling  GI:  No heartburn, indigestion, , change in bowel habits, loss of appetite, melena, blood in stool, hematemesis Resp:  no shortness of breath at rest. No dyspnea on exertion, No excess mucus, no productive cough, No non-productive cough, No coughing up of blood.No change in color of mucus.No wheezing. Skin:  no rash or lesions. No jaundice GU:  no dysuria, change in color of urine, no urgency or frequency. No straining to urinate.  No flank pain.  Musculoskeletal:  No joint pain or no joint swelling. No decreased range of motion. No back pain.  Psych:  No change in mood or affect. No depression or anxiety. No memory loss.  Neuro: no localizing neurological complaints, no tingling, no weakness, no double vision, no gait abnormality, no slurred speech, no confusion  All systems reviewed and apart from HOPI all are negative _______________________________________________________________________________________________ Past Medical History:   Past Medical History:  Diagnosis Date   Anemia    Arthritis    Back pain    Cancer (HCC)    colon   Hypertension       Past Surgical History:  Procedure Laterality Date   COLON SURGERY     colectomy, R   COLONOSCOPY WITH PROPOFOL N/A 04/25/2015   Procedure: COLONOSCOPY WITH PROPOFOL;  Surgeon: Charolett Bumpers, MD;  Location: [facility];  Service: Endoscopy;  Laterality: N/A;   ESOPHAGOGASTRODUODENOSCOPY (EGD) WITH PROPOFOL N/A 10/02/2016   Procedure: ESOPHAGOGASTRODUODENOSCOPY (EGD) WITH PROPOFOL;  Surgeon: Charolett Bumpers, MD;  Location: [facility];  Service: Endoscopy;  Laterality: N/A;   EYE SURGERY     bilateral cataracts with lens implants   SPINE SURGERY      Social History:  Ambulatory   independently      reports that he has never smoked. He has never used smokeless tobacco. He reports current alcohol use. He reports that he does not use drugs.     Family History: History reviewed. No pertinent family  history. ______________________________________________________________________________________________ Allergies: Allergies  Allergen Reactions   Tramadol Itching     Prior to Admission medications   Medication Sig Start Date End Date Taking? Authorizing Provider  fluticasone (CUTIVATE) 0.05 % cream Apply 1 Application topically daily as needed (itching).    [provider]  gabapentin (NEURONTIN) 300 MG capsule TAKE ONE CAPSULE BY MOUTH EVERY MORNING AND TAKE TWO CAPSULES BY MOUTH EVERY NIGHT AT BEDTIME Patient taking differently: Take 300-600 mg by mouth See admin instructions. Takes 300 mg in the morning and 600 mg at night. 11/05/16  Andrena Mews, DO  lisinopril (PRINIVIL,ZESTRIL) 20 MG tablet Take 20 mg by mouth daily.    [provider]  omeprazole (PRILOSEC) 20 MG capsule Take 20 mg by mouth daily.    [provider]  pravastatin (PRAVACHOL) 40 MG tablet Take 40 mg by mouth every evening.    [provider]  tamsulosin (FLOMAX) 0.4 MG CAPS capsule Take 0.4 mg by mouth daily.    [provider]  Wheat Dextrin (BENEFIBER HEALTHY SHAPE) POWD Take 2 Capfuls by mouth daily.    [provider]    ___________________________________________________________________________________________________ Physical Exam:    02/20/2024    6:02 PM 02/20/2024    2:39 PM 02/20/2024    1:30 PM  Vitals with BMI  Systolic 140 141 784  Diastolic 57 68 57  Pulse 71 70 60     1. General:  in No Acute distress   Chronically ill  -appearing 2. Psychological: Alert and   Oriented 3. Head/ENT:    Dry Mucous Membranes                          Head Non traumatic, neck supple                           Poor Dentition 4. SKIN:  decreased Skin turgor,  Skin clean Dry and intact no rash    5. Heart: Regular rate and rhythm  cardiac Murmur, no Rub or gallop 6. Lungs:  no wheezes or crackles   7. Abdomen: Soft, generalized tender, Non distended  bowel sounds diminished 8. Lower extremities: no clubbing, cyanosis, no  edema 9. Neurologically Grossly intact, moving all 4 extremities equally  10. MSK: Normal range of motion    Chart has been reviewed  ______________________________________________________________________________________________  Assessment/Plan  88 y.o. male with medical history significant of SBO, colitis, HTN, HLD, BPH, status post right colectomy due to colon mass, renal carcinoma,, anemia, history of celiac artery stenosis  Admitted for partial small bowel obstruction, enteritis and AKI   Present on Admission:  SBO (small bowel obstruction) (HCC)  BPH (benign prostatic hyperplasia)  HLD (hyperlipidemia)  HTN (hypertension)  Normocytic anemia  Celiac artery stenosis (HCC)  Enteritis  Syncope   SBO (small bowel obstruction) (HCC)  Likely cause  unknown  - admit for conservative management  - NG tube - NPO - KUB in AM - appreciate General surgery consult. Appreciate GI consult   BPH (benign prostatic hyperplasia) Given syncope hold Flomax  HLD (hyperlipidemia) Hold Pravachol while n.p.o. blood resume when able  HTN (hypertension) Allow permissive hypertension, hold lisinopril in setting of AKI  Normocytic anemia Obtain anemia panel  Transfuse for Hg <7 , rapidly dropping or  if symptomatic   Renal lesion Suspicious for renal cell carcinoma.  Need to have follow-up arranged for as an outpatient  Celiac artery stenosis [facility]) Status post dilatation  Enteritis Supportive management order gastric panel.  Syncope Likely in the setting of dehydration and AKI,  Possibly vasovagal in the setting of straining For completion obtain echogram Monitor on telemetry    Other plan as per orders.  DVT prophylaxis:  SCD     Code Status:    Code Status: Prior FULL CODE as per patient  I had personally discussed CODE STATUS with patient and family  ACP   none   Family Communication:   Family    at  Bedside  plan  of care was discussed   with  Wife,   Diet  Diet Orders (From admission, onward)     Start     Ordered   02/20/24 1125  Diet NPO time specified  Diet effective now        02/20/24 1124            Disposition Plan:        To home once workup is complete and patient is stable   Following barriers for discharge:                            SBO work up is complete                            Electrolytes corrected                               Anemia  stable                             Pain controlled with PO medications                                                         Will need consultants to evaluate patient prior to discharge       Consult Orders  (From admission, onward)           Start     Ordered   02/20/24 1914  Consult to hospitalist  Once       Provider:  (Not yet assigned)  Question Answer Comment  Place call to: [facility]   Reason for Consult Admit      02/20/24 1913             Consults called: GI Dr., Elnoria Howard  General surgery Dr. Denton Meek Surgeon Dr. Luisa Hart     Admission status:  ED Disposition     ED Disposition  Admit   Condition  --   Comment  Hospital Area: [facility] [[id number]]  Level of Care: Telemetry [5]  Admit to tele based on following criteria: Other see comments  Comments: aki  May admit patient to [facility] or [facility] if equivalent level of care is available:: No  Covid Evaluation: Asymptomatic - no recent exposure (last 10 days) testing not required  Diagnosis: SBO (small bowel obstruction) [facility]) [[id number]]  Admitting Physician: Therisa Doyne [[id number]]  Attending Physician: Therisa Doyne [[id number]]  Certification:: I certify this patient will need inpatient services for at least 2 midnights  Expected Medical Readiness: 02/24/2024            inpatient     I Expect 2 midnight stay secondary to severity of patient's current illness need for inpatient interventions  justified by the following:     Severe lab/radiological/exam abnormalities including:    SBO, enteritis and extensive comorbidities including:   malignancy,    That are currently affecting medical management.   I expect  patient to be hospitalized for 2 midnights requiring inpatient medical care.  Patient is at high risk for adverse outcome (such as loss of life or disability) if not  treated.  Indication for inpatient stay as follows:    severe pain requiring acute inpatient management,  inability to maintain oral hydration    Need for operative/procedural  intervention     Need for  IV fluids,     Level of care     tele  For  24H      Lab Results  Component Value Date   SARSCOV2NAA NEGATIVE 02/20/2024     Precautions: admitted as   Covid Negative     Shaunessy Dobratz 02/20/2024, 9:39 PM    [facility]     after 2 AM please page floor coverage PA If 7AM-7PM, please contact the day team taking care of the patient using [email or web address]

## 2024-02-20 NOTE — Assessment & Plan Note (Signed)
 Likely in the setting of dehydration and AKI,  Possibly vasovagal in the setting of straining For completion obtain echogram Monitor on telemetry

## 2024-02-20 NOTE — Consult Note (Signed)
 Reason for Consult:SBO Referring Physician: Adela Glimpse MD  Cory Bright is an 88 y.o. male.  HPI: 88 YO MALE 2 day hx of n/v/abdominal pain and diarrhea.  CT shows enteritis vs psbo  Feels ok with NGT Pain better  Still has diarrhea  Past Medical History:  Diagnosis Date   Anemia    Arthritis    Back pain    Cancer (HCC)    colon   Hypertension     Past Surgical History:  Procedure Laterality Date   COLON SURGERY     colectomy, R   COLONOSCOPY WITH PROPOFOL N/A 04/25/2015   Procedure: COLONOSCOPY WITH PROPOFOL;  Surgeon: Charolett Bumpers, MD;  Location: [facility];  Service: Endoscopy;  Laterality: N/A;   ESOPHAGOGASTRODUODENOSCOPY (EGD) WITH PROPOFOL N/A 10/02/2016   Procedure: ESOPHAGOGASTRODUODENOSCOPY (EGD) WITH PROPOFOL;  Surgeon: Charolett Bumpers, MD;  Location: [facility];  Service: Endoscopy;  Laterality: N/A;   EYE SURGERY     bilateral cataracts with lens implants   SPINE SURGERY      History reviewed. No pertinent family history.  Social History:  reports that he has never smoked. He has never used smokeless tobacco. He reports current alcohol use. He reports that he does not use drugs.  Allergies:  Allergies  Allergen Reactions   Tramadol Itching    Medications: I have reviewed the patient's current medications.  Results for orders placed or performed during the hospital encounter of 02/20/24 (from the past 48 hours)  Lipase, blood     Status: None   Collection Time: 02/20/24 11:34 AM  Result Value Ref Range   Lipase 24 11 - 51 U/L    Comment: Performed at [facility], [address], [place], [place] [address]  Comprehensive metabolic panel     Status: Abnormal   Collection Time: 02/20/24 11:34 AM  Result Value Ref Range   Sodium 138 135 - 145 mmol/L   Potassium 3.7 3.5 - 5.1 mmol/L   Chloride 107 98 - 111 mmol/L   CO2 23 22 - 32 mmol/L   Glucose, Bld 112 (H) 70 - 99 mg/dL    Comment: Glucose reference range applies only  to samples taken after fasting for at least 8 hours.   BUN 34 (H) 8 - 23 mg/dL   Creatinine, Ser 7.82 (H) 0.61 - 1.24 mg/dL   Calcium 8.8 (L) 8.9 - 10.3 mg/dL   Total Protein 7.2 6.5 - 8.1 g/dL   Albumin 3.4 (L) 3.5 - 5.0 g/dL   AST 23 15 - 41 U/L   ALT 11 0 - 44 U/L   Alkaline Phosphatase 62 38 - 126 U/L   Total Bilirubin 0.5 0.0 - 1.2 mg/dL   GFR, Estimated 42 (L) >60 mL/min    Comment: (NOTE) Calculated using the CKD-EPI Creatinine Equation (2021)    Anion gap 8 5 - 15    Comment: Performed at [facility], [address] [place], [place] [address]  CBC     Status: Abnormal   Collection Time: 02/20/24 11:34 AM  Result Value Ref Range   WBC 9.4 4.0 - 10.5 K/uL   RBC 3.54 (L) 4.22 - 5.81 MIL/uL   Hemoglobin 10.5 (L) 13.0 - 17.0 g/dL   HCT 30.8 (L) 65.7 - 84.6 %   MCV 96.9 80.0 - 100.0 fL   MCH 29.7 26.0 - 34.0 pg   MCHC 30.6 30.0 - 36.0 g/dL   RDW 96.2 95.2 - 84.1 %  Platelets 340 150 - 400 K/uL   nRBC 0.0 0.0 - 0.2 %    Comment: Performed at [facility], [address], [place], [place] [address]  Urinalysis, Routine w reflex microscopic -Urine, Clean Catch     Status: Abnormal   Collection Time: 02/20/24 11:34 AM  Result Value Ref Range   Color, Urine YELLOW YELLOW   APPearance HAZY (A) CLEAR   Specific Gravity, Urine 1.019 1.005 - 1.030   pH 5.0 5.0 - 8.0   Glucose, UA NEGATIVE NEGATIVE mg/dL   Hgb urine dipstick NEGATIVE NEGATIVE   Bilirubin Urine NEGATIVE NEGATIVE   Ketones, ur NEGATIVE NEGATIVE mg/dL   Protein, ur 30 (A) NEGATIVE mg/dL   Nitrite NEGATIVE NEGATIVE   Leukocytes,Ua NEGATIVE NEGATIVE   RBC / HPF 0-5 0 - 5 RBC/hpf   WBC, UA 0-5 0 - 5 WBC/hpf   Bacteria, UA RARE (A) NONE SEEN   Squamous Epithelial / HPF 0-5 0 - 5 /HPF   Mucus PRESENT    Hyaline Casts, UA PRESENT     Comment: Performed at [facility], [address] [place], [place] [address]  Creatinine, urine, random     Status: None    Collection Time: 02/20/24 11:34 AM  Result Value Ref Range   Creatinine, Urine 291 mg/dL    Comment: Performed at [facility], [address] [place], [place] [address]  Sodium, urine, random     Status: None   Collection Time: 02/20/24 11:34 AM  Result Value Ref Range   Sodium, Ur 18 mmol/L    Comment: Performed at [facility], [address], [place], [place] [address]  Resp panel by RT-PCR (RSV, Flu A&B, Covid) Anterior Nasal Swab     Status: None   Collection Time: 02/20/24 12:50 PM   Specimen: Anterior Nasal Swab  Result Value Ref Range   SARS Coronavirus 2 by RT PCR NEGATIVE NEGATIVE    Comment: (NOTE) SARS-CoV-2 target nucleic acids are NOT DETECTED.  The SARS-CoV-2 RNA is generally detectable in upper respiratory specimens during the acute phase of infection. The lowest concentration of SARS-CoV-2 viral copies this assay can detect is 138 copies/mL. A negative result does not preclude SARS-Cov-2 infection and should not be used as the sole basis for treatment or other patient management decisions. A negative result may occur with  improper specimen collection/handling, submission of specimen other than nasopharyngeal swab, presence of viral mutation(s) within the areas targeted by this assay, and inadequate number of viral copies(<138 copies/mL). A negative result must be combined with clinical observations, patient history, and epidemiological information. The expected result is Negative.  Fact Sheet for Patients:  BloggerCourse.com  Fact Sheet for Healthcare Providers:  SeriousBroker.it  This test is no t yet approved or cleared by the [place] FDA and  has been authorized for detection and/or diagnosis of SARS-CoV-2 by FDA under an Emergency Use Authorization (EUA). This EUA will remain  in effect (meaning this test can be used) for the duration of the COVID-19 declaration  under Section 564(b)(1) of the Act, 21 U.S.C.section 360bbb-3(b)(1), unless the authorization is terminated  or revoked sooner.       Influenza A by PCR NEGATIVE NEGATIVE   Influenza B by PCR NEGATIVE NEGATIVE    Comment: (NOTE) The Xpert Xpress SARS-CoV-2/FLU/RSV plus assay is intended as an aid in the diagnosis of influenza from Nasopharyngeal swab specimens and should not be used as a sole basis for treatment.  Nasal washings and aspirates are unacceptable for Xpert Xpress SARS-CoV-2/FLU/RSV testing.  Fact Sheet for Patients: BloggerCourse.com  Fact Sheet for Healthcare Providers: SeriousBroker.it  This test is not yet approved or cleared by the [place] FDA and has been authorized for detection and/or diagnosis of SARS-CoV-2 by FDA under an Emergency Use Authorization (EUA). This EUA will remain in effect (meaning this test can be used) for the duration of the COVID-19 declaration under Section 564(b)(1) of the Act, 21 U.S.C. section 360bbb-3(b)(1), unless the authorization is terminated or revoked.     Resp Syncytial Virus by PCR NEGATIVE NEGATIVE    Comment: (NOTE) Fact Sheet for Patients: BloggerCourse.com  Fact Sheet for Healthcare Providers: SeriousBroker.it  This test is not yet approved or cleared by the [place] FDA and has been authorized for detection and/or diagnosis of SARS-CoV-2 by FDA under an Emergency Use Authorization (EUA). This EUA will remain in effect (meaning this test can be used) for the duration of the COVID-19 declaration under Section 564(b)(1) of the Act, 21 U.S.C. section 360bbb-3(b)(1), unless the authorization is terminated or revoked.  Performed at [facility], [address] [place], [place] [address]   CBC     Status: Abnormal   Collection Time: 02/20/24  8:40 PM  Result Value Ref Range   WBC 8.5 4.0 -  10.5 K/uL   RBC 2.97 (L) 4.22 - 5.81 MIL/uL   Hemoglobin 8.9 (L) 13.0 - 17.0 g/dL   HCT 91.4 (L) 78.2 - 95.6 %   MCV 93.9 80.0 - 100.0 fL   MCH 30.0 26.0 - 34.0 pg   MCHC 31.9 30.0 - 36.0 g/dL   RDW 21.3 08.6 - 57.8 %   Platelets 277 150 - 400 K/uL   nRBC 0.0 0.0 - 0.2 %    Comment: Performed at [facility], [address] [place], [place] [address]  Type and screen [facility]     Status: None (Preliminary result)   Collection Time: 02/20/24  8:40 PM  Result Value Ref Range   ABO/RH(D) PENDING    Antibody Screen PENDING    Sample Expiration      02/23/2024,2359 Performed at [facility], [address], [place], [place] [address]     CT ABDOMEN PELVIS W CONTRAST Result Date: 02/20/2024 CLINICAL DATA:  Acute generalized abdominal pain. EXAM: CT ABDOMEN AND PELVIS WITH CONTRAST TECHNIQUE: Multidetector CT imaging of the abdomen and pelvis was performed using the standard protocol following bolus administration of intravenous contrast. RADIATION DOSE REDUCTION: This exam was performed according to the departmental dose-optimization program which includes automated exposure control, adjustment of the mA and/or kV according to patient size and/or use of iterative reconstruction technique. CONTRAST:  80mL OMNIPAQUE IOHEXOL 300 MG/ML  SOLN COMPARISON:  March 02, 2023.  February 17, 2023. FINDINGS: Lower chest: No acute abnormality. Hepatobiliary: No focal liver abnormality is seen. No gallstones, gallbladder wall thickening, or biliary dilatation. Pancreas: Unremarkable. No pancreatic ductal dilatation or surrounding inflammatory changes. Spleen: Normal in size without focal abnormality. Adrenals/Urinary Tract: Adrenal glands appear normal. No hydronephrosis or renal obstruction is noted. Stable bilateral renal cysts are noted for which no further follow-up is required. 2.4 x 1.8 cm enhancing exophytic mass is seen in the medial portion of left  kidney again which is slightly enlarged in consistent with history of renal cell carcinoma as noted on prior MRI. Urinary bladder is unremarkable. Stomach/Bowel: Stomach is unremarkable. Extensive postsurgical changes are seen involving  the right colon and distal small bowel consistent with history of right colectomy. There is continued small bowel dilatation, but there is now noted moderate diffuse small bowel wall thickening concerning for enteritis or other inflammation. No definite colonic dilatation is noted. Stable blind-ending pouch with appendicoliths is again noted in right side of abdomen which most likely represents postsurgical change. Vascular/Lymphatic: Aortic atherosclerosis. No enlarged abdominal or pelvic lymph nodes. Stable soft tissue density seen around the aorta suggesting some degree of retroperitoneal fibrosis. Reproductive: Prostate is unremarkable. Other: Small fat containing left inguinal hernia.  No ascites. Musculoskeletal: Status post surgical posterior fusion of L4-5 with bilateral intrapedicular screw placement. No acute osseous abnormality is noted. IMPRESSION: Extensive postsurgical changes are seen involving the right colon and distal small bowel consistent with history of right colectomy. There remains mild diffuse small bowel dilatation which extends to the surgical anastomosis in the right side of the abdomen, concerning for partial obstruction. There is the interval development of moderate wall thickening involving small bowel loops concerning for enteritis or other inflammatory process. 2.4 x 1.8 cm enhancing exophytic mass is seen in the medial portion of left kidney which is slightly enlarged compared to prior exam and consistent with renal cell carcinoma as noted on prior MRI. Aortic Atherosclerosis (ICD10-I70.0). Electronically Signed   By: Lupita Raider M.D.   On: 02/20/2024 16:56    Review of Systems  All other systems reviewed and are negative.  Blood pressure  (!) 140/57, pulse 67, temperature 98.6 F (37 C), temperature source Oral, resp. rate 15, SpO2 95%. Physical Exam Cardiovascular:     Rate and Rhythm: Normal rate.  Pulmonary:     Effort: Pulmonary effort is normal.  Abdominal:     General: Abdomen is protuberant.     Palpations: Abdomen is soft.     Tenderness: There is no abdominal tenderness. There is no guarding or rebound.  Skin:    General: Skin is warm.  Neurological:     General: No focal deficit present.     Mental Status: He is alert.  Psychiatric:        Mood and Affect: Mood normal.     Assessment/Plan: Enteritis vs SBO SB protocol, NGT, IVF Surgery will follow  Dortha Schwalbe MD  02/20/2024, 9:45 PM   MODERATE  COMPLEXITY

## 2024-02-20 NOTE — ED Notes (Signed)
 Pt ambulated to bathroom without assistance

## 2024-02-20 NOTE — Assessment & Plan Note (Signed)
 Suspicious for renal cell carcinoma.  Need to have follow-up arranged for as an outpatient

## 2024-02-20 NOTE — Assessment & Plan Note (Signed)
 Obtain anemia panel  Transfuse for Hg <7 , rapidly dropping or  if symptomatic

## 2024-02-20 NOTE — Assessment & Plan Note (Signed)
 Given syncope hold Flomax

## 2024-02-21 ENCOUNTER — Inpatient Hospital Stay (HOSPITAL_COMMUNITY)

## 2024-02-21 ENCOUNTER — Encounter (HOSPITAL_COMMUNITY): Payer: Self-pay | Admitting: Internal Medicine

## 2024-02-21 DIAGNOSIS — R55 Syncope and collapse: Secondary | ICD-10-CM

## 2024-02-21 DIAGNOSIS — K56609 Unspecified intestinal obstruction, unspecified as to partial versus complete obstruction: Secondary | ICD-10-CM | POA: Diagnosis not present

## 2024-02-21 LAB — ECHOCARDIOGRAM COMPLETE
Area-P 1/2: 4.49 cm2
Height: 68 in
S' Lateral: 2.8 cm
Weight: 2260.8 [oz_av]

## 2024-02-21 LAB — GASTROINTESTINAL PANEL BY PCR, STOOL (REPLACES STOOL CULTURE)

## 2024-02-21 LAB — CBC
HCT: 27.9 % — ABNORMAL LOW (ref 39.0–52.0)
Hemoglobin: 8.5 g/dL — ABNORMAL LOW (ref 13.0–17.0)
MCH: 29.3 pg (ref 26.0–34.0)
MCHC: 30.5 g/dL (ref 30.0–36.0)
MCV: 96.2 fL (ref 80.0–100.0)
Platelets: 259 10*3/uL (ref 150–400)
RBC: 2.9 MIL/uL — ABNORMAL LOW (ref 4.22–5.81)
RDW: 12.9 % (ref 11.5–15.5)
WBC: 7.3 10*3/uL (ref 4.0–10.5)
nRBC: 0 % (ref 0.0–0.2)

## 2024-02-21 LAB — COMPREHENSIVE METABOLIC PANEL
ALT: 10 U/L (ref 0–44)
AST: 17 U/L (ref 15–41)
Albumin: 2.7 g/dL — ABNORMAL LOW (ref 3.5–5.0)
Alkaline Phosphatase: 50 U/L (ref 38–126)
Anion gap: 7 (ref 5–15)
BUN: 28 mg/dL — ABNORMAL HIGH (ref 8–23)
CO2: 20 mmol/L — ABNORMAL LOW (ref 22–32)
Calcium: 7.8 mg/dL — ABNORMAL LOW (ref 8.9–10.3)
Chloride: 109 mmol/L (ref 98–111)
Creatinine, Ser: 1.19 mg/dL (ref 0.61–1.24)
GFR, Estimated: 60 mL/min — ABNORMAL LOW (ref >60)
Glucose, Bld: 83 mg/dL (ref 70–99)
Potassium: 3.5 mmol/L (ref 3.5–5.1)
Sodium: 136 mmol/L (ref 135–145)
Total Bilirubin: 0.5 mg/dL (ref 0.0–1.2)
Total Protein: 5.8 g/dL — ABNORMAL LOW (ref 6.5–8.1)

## 2024-02-21 LAB — PREALBUMIN: Prealbumin: 17 mg/dL — ABNORMAL LOW (ref 18–38)

## 2024-02-21 LAB — MAGNESIUM: Magnesium: 2 mg/dL (ref 1.7–2.4)

## 2024-02-21 LAB — OSMOLALITY: Osmolality: 303 mosm/kg — ABNORMAL HIGH (ref 275–295)

## 2024-02-21 LAB — OSMOLALITY, URINE: Osmolality, Ur: 506 mosm/kg (ref 300–900)

## 2024-02-21 LAB — PHOSPHORUS: Phosphorus: 3.3 mg/dL (ref 2.5–4.6)

## 2024-02-21 MED ORDER — PHENOL 1.4 % MT LIQD
1.0000 | OROMUCOSAL | Status: DC | PRN
  Administered 2024-02-21: 1 via OROMUCOSAL
  Filled 2024-02-21: qty 177

## 2024-02-21 MED ORDER — HYDRALAZINE HCL 20 MG/ML IJ SOLN
10.0000 mg | Freq: Four times a day (QID) | INTRAMUSCULAR | Status: DC | PRN
  Administered 2024-02-21: 10 mg via INTRAVENOUS
  Filled 2024-02-21: qty 1

## 2024-02-21 NOTE — Progress Notes (Signed)
 Initial Nutrition Assessment  INTERVENTION:   -Monitor for diet advancement -Consider protein supplements once diet advanced  NUTRITION DIAGNOSIS:   Increased nutrient needs related to diarrhea as evidenced by estimated needs.  GOAL:   Patient will meet greater than or equal to 90% of their needs  MONITOR:   PO intake, Diet advancement  REASON FOR ASSESSMENT:   Consult Assessment of nutrition requirement/status  ASSESSMENT:   88 y.o. male with a history of SBO, colitis, hypertension, hyperlipidemia, BPH, history of right colon mass s/p right colectomy, renal carcinoma, anemia, celiac artery stenosis.  Patient presented secondary to abdominal pain, nausea, vomiting with concern for possible partial small bowel obstruction and enteritis on CT imaging.  Patient unavailable at time of visit. Pt with provider. Per chart review, pt now having BMs. No longer having N/V. Was unable to have NGT placed for suction x 3. Will monitor for diet advancement, will consider supplements at that time. Currently just allowed floor stock on unit.  Per weight records, no significant weight changes noted. Monitor weight trends.  Medications reviewed.  Labs reviewed.   NUTRITION - FOCUSED PHYSICAL EXAM:  Unable to complete at this time  Diet Order:   Diet Order             Diet NPO time specified Except for: Sips with Meds, Ice Chips, Other (See Comments)  Diet effective now                   EDUCATION NEEDS:   Not appropriate for education at this time  Skin:  Skin Assessment: Reviewed RN Assessment  Last BM:  3/14 -type 6  Height:   Ht Readings from Last 1 Encounters:  02/21/24 5\' 8"  (1.727 m)    Weight:   Wt Readings from Last 1 Encounters:  02/21/24 64.1 kg    BMI:  Body mass index is 21.48 kg/m.  Estimated Nutritional Needs:   Kcal:  1900-2100  Protein:  75-90g  Fluid:  2L/day   Tilda Franco, MS, RD, LDN Inpatient Clinical Dietitian Contact via  Secure chat

## 2024-02-21 NOTE — Progress Notes (Signed)
 Pt had 1 episode of BM. Stool sample sent to lab.

## 2024-02-21 NOTE — Hospital Course (Signed)
 Cory Bright is a 88 y.o. male with a history of SBO, colitis, hypertension, hyperlipidemia, BPH, history of right colon mass s/p right colectomy, renal carcinoma, anemia, celiac artery stenosis.  Patient presented secondary to abdominal pain, nausea, vomiting with concern for possible partial small bowel obstruction and enteritis on CT imaging. General surgery consulted. NG tube placed but required removal. Bowel function returning and emesis has resolved.

## 2024-02-21 NOTE — Progress Notes (Signed)
   02/21/24 1150  PT Visit Information  Reason Eval/Treat Not Completed Other (comment)   Pt declines PT this morning, visiting with family. Will follow up.  Madaline Guthrie, PT Acute Rehabilitation Services Office: [phone] 02/21/2024

## 2024-02-21 NOTE — Evaluation (Signed)
 Occupational Therapy Evaluation Patient Details Name: Cory Bright MRN: [id number] DOB: 09-25-1936 Today's Date: 02/21/2024   History of Present Illness   Cory Bright is a 88 y.o. male who presented to [facility] on [facility]/20/24 secondary to abdominal pain, nausea, vomiting with concern for possible partial small bowel obstruction and enteritis on CT imaging. Pt also had syncopal event with loss od consciouness. General surgery consulted. NG tube placed but required removal. Bowel function returning and emesis has resolved. Pt with a history of SBO, colitis, hypertension, hyperlipidemia, BPH, history of right colon mass s/p right colectomy, renal carcinoma, anemia, celiac artery stenosis.     Clinical Impressions Patient evaluated by Occupational Therapy with no further acute OT needs identified. All education has been completed and the patient has no further questions. Reviewed fall prevention with possible orthostatic hypotension and fall prevention strategies prior to mobilizing.  See below for any follow-up Occupational Therapy or equipment needs. OT is signing off. Thank you for this referral.      If plan is discharge home, recommend the following:    (No assistance needed)     Functional Status Assessment   Patient has had a recent decline in their functional status and/or demonstrates limited ability to make significant improvements in function in a reasonable and predictable amount of time     Equipment Recommendations   None recommended by OT     Recommendations for Other Services         Precautions/Restrictions   Precautions Precautions: None Restrictions Weight Bearing Restrictions Per Provider Order: No     Mobility Bed Mobility Overal bed mobility: Independent                  Transfers Overall transfer level: Independent                        Balance Overall balance assessment: Independent                                          ADL either performed or assessed with clinical judgement   ADL Overall ADL's : At baseline                                       General ADL Comments: Pt able to demonstrate bed mobility, ambulation to/from bathroom, walk in shower and toilet transfers (Toilet simulated to recliner) without need of grab bar, LE and UE dressing all Independent and pt denying any s/s related to syncope.     Vision Baseline Vision/History: 1 Wears glasses Ability to See in Adequate Light: 0 Adequate Vision Assessment?: No apparent visual deficits     Perception         Praxis         Pertinent Vitals/Pain Pain Assessment Pain Assessment: No/denies pain     Extremity/Trunk Assessment Upper Extremity Assessment Upper Extremity Assessment: Overall WFL for tasks assessed   Lower Extremity Assessment Lower Extremity Assessment: Overall WFL for tasks assessed       Communication Communication Communication: No apparent difficulties Factors Affecting Communication: Hearing impaired   Cognition Arousal: Alert Behavior During Therapy: WFL for tasks assessed/performed Cognition: No apparent impairments  Following commands: Intact       Cueing  General Comments          Exercises     Shoulder Instructions      Home Living Family/patient expects to be discharged to:: Private residence Living Arrangements: Spouse/significant other Available Help at Discharge: Family;Available PRN/intermittently Type of Home: House Home Access: Stairs to enter [employer] of Steps: 4 Entrance Stairs-Rails: Left Home Layout: Two level;Full bath on main level;Able to live on main level with bedroom/bathroom     Bathroom Shower/Tub: Producer, television/film/video: Standard     Home Equipment: Grab bars - tub/shower;Hand held shower head          Prior Functioning/Environment Prior Level of Function :  Independent/Modified Independent;History of Falls (last six months);Driving             Mobility Comments: Tends to walk his dog 4-5 miles a day with 1 trekking/hiking pole. Has occassional fall outside tripping while hiking-roots, etc. ADLs Comments: Independent.    OT Problem List:  (None. Symptoms resolving)   OT Treatment/Interventions:        OT Goals(Current goals can be found in the care plan section)   Acute Rehab OT Goals Patient Stated Goal: Go home soon OT Goal Formulation: All assessment and education complete, DC therapy   OT Frequency:       Co-evaluation              AM-PAC OT "6 Clicks" Daily Activity     Outcome Measure Help from another person eating meals?: None Help from another person taking care of personal grooming?: None Help from another person toileting, which includes using toliet, bedpan, or urinal?: None Help from another person bathing (including washing, rinsing, drying)?: None Help from another person to put on and taking off regular upper body clothing?: None Help from another person to put on and taking off regular lower body clothing?: None 6 Click Score: 24   End of Session Nurse Communication: Mobility status;Other (comment) (RN, Victorino Dike okay with no use of bed or chair alarm.)  Activity Tolerance: Patient tolerated treatment well Patient left: in chair;with nursing/sitter in room  OT Visit Diagnosis: Dizziness and giddiness (R42)                Time: 1610-9604 OT Time Calculation (min): 22 min Charges:  OT General Charges $OT Visit: 1 Visit OT Evaluation $OT Eval Low Complexity: 1 Low  Cory Bright, OT Acute Rehab Services Office: [phone] 02/21/2024   Cory Bright 02/21/2024, 1:19 PM

## 2024-02-21 NOTE — Progress Notes (Signed)
 Attempted to reinserted NGT but refused. Educated about why he needs it and how will it will help him with his current situation. Patient wants to wait for the surgery doctor to come and tell him that he needs NGT.  Still no complaints of nausea and vomiting.

## 2024-02-21 NOTE — Progress Notes (Addendum)
 Xray result for NGT placement showed kink and loop in the distal esophagus. Removed tube as instructed. Attempted to reinsert the tube back twice but unsuccessful due to patient complaining of SOB and pain. O2 sat was 97% on room air. On call NP made aware. Will give the patient a break and will try again later. No complaints of nausea and vomiting. Will continue to monitor.

## 2024-02-21 NOTE — Progress Notes (Signed)
   02/21/24 1644  PT Visit Information  Reason Eval/Treat Not Completed PT screened, no needs identified, will sign off  History of Present Illness JAMALL STROHMEIER is a 88 y.o. male who presented to [facility] on 02/20/24 secondary to abdominal pain, nausea, vomiting with concern for possible partial small bowel obstruction and enteritis on CT imaging. Pt also had syncopal event with loss od consciouness. General surgery consulted. NG tube placed but required removal. Bowel function returning and emesis has resolved. Pt with a history of SBO, colitis, hypertension, hyperlipidemia, BPH, history of right colon mass s/p right colectomy, renal carcinoma, anemia, celiac artery stenosis.   Pt declines need for skilled PT services at this time. Please place order should needs arise.   Madaline Guthrie, PT Acute Rehabilitation Services Office: [phone] 02/21/2024\

## 2024-02-21 NOTE — Progress Notes (Signed)
 PROGRESS NOTE    Cory Bright  NFA:[id number] DOB: 20-Dec-1935 DOA: 02/20/2024 PCP: Alysia Penna, MD   Brief Narrative: Cory Bright is a 88 y.o. male with a history of SBO, colitis, hypertension, hyperlipidemia, BPH, history of right colon mass s/p right colectomy, renal carcinoma, anemia, celiac artery stenosis.  Patient presented secondary to abdominal pain, nausea, vomiting with concern for possible partial small bowel obstruction and enteritis on CT imaging. General surgery consulted. NG tube placed but required removal. Bowel function returning and emesis has resolved.   Assessment and Plan:  Partial small bowel obstruction Concerning for etiology of symptoms on admission. General surgery consulted with recommendation for NG tube placement and small bowel protocol. NG tube malpositioned with attempts to replace unsuccessful as patient declined continued attempts. General surgery recommending to hold NG tube placement and have advanced diet. Patient is having bowel function and nausea/vomiting improved. -General surgery  Small bowel thickening Concerning for possible infectious etiology. GI pathogen panel ordered and is pending. -Follow-up GI pathogen panel  AKI Baseline creatinine of about 0.8-0.9. Creatinine of 1.60 on admission. Improving with IV fluids.  Syncope In setting of nausea, vomiting and diarrhea with subsequent dehydration. Syncope occurred after severe abdominal pain episode with profuse vomiting. -Continue telemetry  BPH Patient is on tamsulosin as an outpatient which is held while patient is NPO.  Primary hypertension Patient is on lisinopril as an outpatient which is held while patient is NPO. Blood pressure not well controlled. -Start hydralazine IV prn  Chronic normocytic anemia Baseline hemoglobin is between 8-10 g/dL. Hemoglobin of 10.5 g/dL on admission with an acute drop, likely secondary to initial dehydration state.  Celiac artery  stenosis Noted.   Left renal lesion Known finding. Concerning for renal cell carcinoma. Patient follows with urology as an outpatient.  Aortic atherosclerosis Hyperlipidemia Aortic atherosclerosis noted on CT imaging. Patient is on pravastatin as an outpatient which is held while patient is NPO.  DVT prophylaxis: SCDs Code Status:   Code Status: Full Code Family Communication: None at bedside Disposition Plan: Discharge home pending ongoing general surgery recommendations for discharge   Consultants:  General surgery  Procedures:  NG tube placement/removal  Antimicrobials: None    Subjective: Patient reports improved symptoms today. Abdominal pain is improved. No nausea/vomiting. Patient reports a slightly formed stool  Objective: BP (!) 131/58 (BP Location: Left Arm)   Pulse (!) 59   Temp 98.5 F (36.9 C) (Oral)   Resp 16   Ht 5\' 8"  (1.727 m)   Wt 64.1 kg   SpO2 96%   BMI 21.48 kg/m   Examination:  General exam: Appears calm and comfortable Respiratory system: Clear to auscultation. Respiratory effort normal. Cardiovascular system: S1 & S2 heard, RRR. Gastrointestinal system: Abdomen is distended, soft and mildly tender in lower quadrants. Normal bowel sounds heard. Central nervous system: Alert and oriented. No focal neurological deficits. Psychiatry: Judgement and insight appear normal. Mood & affect appropriate.    Data Reviewed: I have personally reviewed following labs and imaging studies  CBC Lab Results  Component Value Date   WBC 7.3 02/21/2024   RBC 2.90 (L) 02/21/2024   HGB 8.5 (L) 02/21/2024   HCT 27.9 (L) 02/21/2024   MCV 96.2 02/21/2024   MCH 29.3 02/21/2024   PLT 259 02/21/2024   MCHC 30.5 02/21/2024   RDW 12.9 02/21/2024   LYMPHSABS 1.5 02/17/2023   MONOABS 1.0 02/17/2023   EOSABS 0.1 02/17/2023   BASOSABS 0.0 02/17/2023  Last metabolic panel Lab Results  Component Value Date   NA 136 02/21/2024   K 3.5 02/21/2024   CL  109 02/21/2024   CO2 20 (L) 02/21/2024   BUN 28 (H) 02/21/2024   CREATININE 1.19 02/21/2024   GLUCOSE 83 02/21/2024   GFRNONAA 60 (L) 02/21/2024   GFRAA >90 07/25/2014   CALCIUM 7.8 (L) 02/21/2024   PHOS 3.3 02/21/2024   PROT 5.8 (L) 02/21/2024   ALBUMIN 2.7 (L) 02/21/2024   BILITOT 0.5 02/21/2024   ALKPHOS 50 02/21/2024   AST 17 02/21/2024   ALT 10 02/21/2024   ANIONGAP 7 02/21/2024    GFR: Estimated Creatinine Clearance: 41.1 mL/min (by C-G formula based on SCr of 1.19 mg/dL).  Recent Results (from the past 240 hours)  Resp panel by RT-PCR (RSV, Flu A&B, Covid) Anterior Nasal Swab     Status: None   Collection Time: 02/20/24 12:50 PM   Specimen: Anterior Nasal Swab  Result Value Ref Range Status   SARS Coronavirus 2 by RT PCR NEGATIVE NEGATIVE Final    Comment: (NOTE) SARS-CoV-2 target nucleic acids are NOT DETECTED.  The SARS-CoV-2 RNA is generally detectable in upper respiratory specimens during the acute phase of infection. The lowest concentration of SARS-CoV-2 viral copies this assay can detect is 138 copies/mL. A negative result does not preclude SARS-Cov-2 infection and should not be used as the sole basis for treatment or other patient management decisions. A negative result may occur with  improper specimen collection/handling, submission of specimen other than nasopharyngeal swab, presence of viral mutation(s) within the areas targeted by this assay, and inadequate number of viral copies(<138 copies/mL). A negative result must be combined with clinical observations, patient history, and epidemiological information. The expected result is Negative.  Fact Sheet for Patients:  BloggerCourse.com  Fact Sheet for Healthcare Providers:  SeriousBroker.it  This test is no t yet approved or cleared by the [place] FDA and  has been authorized for detection and/or diagnosis of SARS-CoV-2 by FDA under an  Emergency Use Authorization (EUA). This EUA will remain  in effect (meaning this test can be used) for the duration of the COVID-19 declaration under Section 564(b)(1) of the Act, 21 U.S.C.section 360bbb-3(b)(1), unless the authorization is terminated  or revoked sooner.       Influenza A by PCR NEGATIVE NEGATIVE Final   Influenza B by PCR NEGATIVE NEGATIVE Final    Comment: (NOTE) The Xpert Xpress SARS-CoV-2/FLU/RSV plus assay is intended as an aid in the diagnosis of influenza from Nasopharyngeal swab specimens and should not be used as a sole basis for treatment. Nasal washings and aspirates are unacceptable for Xpert Xpress SARS-CoV-2/FLU/RSV testing.  Fact Sheet for Patients: BloggerCourse.com  Fact Sheet for Healthcare Providers: SeriousBroker.it  This test is not yet approved or cleared by the [place] FDA and has been authorized for detection and/or diagnosis of SARS-CoV-2 by FDA under an Emergency Use Authorization (EUA). This EUA will remain in effect (meaning this test can be used) for the duration of the COVID-19 declaration under Section 564(b)(1) of the Act, 21 U.S.C. section 360bbb-3(b)(1), unless the authorization is terminated or revoked.     Resp Syncytial Virus by PCR NEGATIVE NEGATIVE Final    Comment: (NOTE) Fact Sheet for Patients: BloggerCourse.com  Fact Sheet for Healthcare Providers: SeriousBroker.it  This test is not yet approved or cleared by the [place] FDA and has been authorized for detection and/or diagnosis of SARS-CoV-2 by FDA under an Emergency Use Authorization (EUA).  This EUA will remain in effect (meaning this test can be used) for the duration of the COVID-19 declaration under Section 564(b)(1) of the Act, 21 U.S.C. section 360bbb-3(b)(1), unless the authorization is terminated or revoked.  Performed at [facility], [address], [place], [place] [address]       Radiology Studies: DG Abd Portable 1 View Result Date: 02/20/2024 CLINICAL DATA:  NG tube placement. EXAM: PORTABLE ABDOMEN - 1 VIEW COMPARISON:  February 20, 2023 FINDINGS: A nasogastric tube is seen with its distal end kinked and looped within the expected region of the distal esophagus. The pelvis and the mid and lower portions of the abdomen are not included in the field of view and are subsequently limited in evaluation. The bowel gas pattern is normal. No radio-opaque calculi or other significant radiographic abnormality are seen. IMPRESSION: Malpositioned nasogastric tube positioning, as described above. Readjustment is recommended. Electronically Signed   By: Aram Candela M.D.   On: 02/20/2024 22:55   CT ABDOMEN PELVIS W CONTRAST Result Date: 02/20/2024 CLINICAL DATA:  Acute generalized abdominal pain. EXAM: CT ABDOMEN AND PELVIS WITH CONTRAST TECHNIQUE: Multidetector CT imaging of the abdomen and pelvis was performed using the standard protocol following bolus administration of intravenous contrast. RADIATION DOSE REDUCTION: This exam was performed according to the departmental dose-optimization program which includes automated exposure control, adjustment of the mA and/or kV according to patient size and/or use of iterative reconstruction technique. CONTRAST:  80mL OMNIPAQUE IOHEXOL 300 MG/ML  SOLN COMPARISON:  March 02, 2023.  February 17, 2023. FINDINGS: Lower chest: No acute abnormality. Hepatobiliary: No focal liver abnormality is seen. No gallstones, gallbladder wall thickening, or biliary dilatation. Pancreas: Unremarkable. No pancreatic ductal dilatation or surrounding inflammatory changes. Spleen: Normal in size without focal abnormality. Adrenals/Urinary Tract: Adrenal glands appear normal. No hydronephrosis or renal obstruction is noted. Stable bilateral renal cysts are noted for which no further follow-up is required. 2.4 x  1.8 cm enhancing exophytic mass is seen in the medial portion of left kidney again which is slightly enlarged in consistent with history of renal cell carcinoma as noted on prior MRI. Urinary bladder is unremarkable. Stomach/Bowel: Stomach is unremarkable. Extensive postsurgical changes are seen involving the right colon and distal small bowel consistent with history of right colectomy. There is continued small bowel dilatation, but there is now noted moderate diffuse small bowel wall thickening concerning for enteritis or other inflammation. No definite colonic dilatation is noted. Stable blind-ending pouch with appendicoliths is again noted in right side of abdomen which most likely represents postsurgical change. Vascular/Lymphatic: Aortic atherosclerosis. No enlarged abdominal or pelvic lymph nodes. Stable soft tissue density seen around the aorta suggesting some degree of retroperitoneal fibrosis. Reproductive: Prostate is unremarkable. Other: Small fat containing left inguinal hernia.  No ascites. Musculoskeletal: Status post surgical posterior fusion of L4-5 with bilateral intrapedicular screw placement. No acute osseous abnormality is noted. IMPRESSION: Extensive postsurgical changes are seen involving the right colon and distal small bowel consistent with history of right colectomy. There remains mild diffuse small bowel dilatation which extends to the surgical anastomosis in the right side of the abdomen, concerning for partial obstruction. There is the interval development of moderate wall thickening involving small bowel loops concerning for enteritis or other inflammatory process. 2.4 x 1.8 cm enhancing exophytic mass is seen in the medial portion of left kidney which is slightly enlarged compared to prior exam and consistent with renal cell carcinoma as noted on prior MRI. Aortic Atherosclerosis (  ICD10-I70.0). Electronically Signed   By: Lupita Raider M.D.   On: 02/20/2024 16:56      LOS: 1 day     Jacquelin Hawking, MD [facility] 02/21/2024, 7:54 AM   If 7PM-7AM, please contact night-coverage [email or web address]

## 2024-02-21 NOTE — TOC Initial Note (Signed)
 Transition of Care [facility]) - Initial/Assessment Note    Patient Details  Name: Cory Bright MRN: [id number] Date of Birth: 07/13/1936  [facility] CM/SW Contact:    Lanier Clam, RN Phone Number: 02/21/2024, 12:21 PM  Clinical Narrative: From home. SBO. Await PT recc.Follow for d/c plans.                  Expected Discharge Plan: Home/Self Care Barriers to Discharge: Continued Medical Work up   Patient Goals and CMS Choice Patient states their goals for this hospitalization and ongoing recovery are:: Home CMS Medicare.gov Compare Post Acute Care list provided to:: Patient Choice offered to / list presented to : [name] ownership interest in [facility].provided to:: Patient    Expected Discharge Plan and Services                                              Prior Living Arrangements/Services                       Activities of Daily Living   ADL Screening (condition at time of admission) Independently performs ADLs?: Yes (appropriate for developmental age) Is the patient deaf or have difficulty hearing?: No Does the patient have difficulty seeing, even when wearing glasses/contacts?: No Does the patient have difficulty concentrating, remembering, or making decisions?: No  Permission Sought/Granted                  Emotional Assessment              Admission diagnosis:  Small bowel obstruction (HCC) [K56.609] SBO (small bowel obstruction) (HCC) [K56.609] Patient Active Problem List   Diagnosis Date Noted   Celiac artery stenosis (HCC) 02/20/2024   Enteritis 02/20/2024   Syncope 02/20/2024   Normocytic anemia 02/18/2023   Renal lesion 02/17/2023   Fever 02/10/2023   Hypotension 02/10/2023   Colitis 02/10/2023   HTN (hypertension) 02/10/2023   HLD (hyperlipidemia) 02/10/2023   BPH (benign prostatic hyperplasia) 02/10/2023   SBO (small bowel obstruction) (HCC) 07/22/2014   PCP:  Alysia Penna, MD Pharmacy:   [facility] [id number] [place], [place] - [address] [place] [address] Phone: [phone] Fax: [phone]     Social Drivers of Health (SDOH) Social History: SDOH Screenings   Food Insecurity: No Food Insecurity (02/21/2024)  Housing: Low Risk  (02/21/2024)  Transportation Needs: No Transportation Needs (02/21/2024)  Utilities: Not At Risk (02/21/2024)  Social Connections: Moderately Integrated (02/21/2024)  Tobacco Use: Low Risk  (02/21/2024)   SDOH Interventions:     Readmission Risk Interventions    02/13/2023   10:11 AM  Readmission Risk Prevention Plan  Post Dischage Appt Complete  Medication Screening Complete  Transportation Screening Complete

## 2024-02-21 NOTE — Progress Notes (Signed)
 Subjective/Chief Complaint: Feeling well this morning.  Had a couple bowel movements overnight that per nurse were slightly more formed.  Denies nausea or abdominal pain.  NG tube placement was ultimately unsuccessful despite 3 attempts.    Objective: Vital signs in last 24 hours: Temp:  [97.9 F (36.6 C)-98.6 F (37 C)] 98.5 F (36.9 C) (03/14 0500) Pulse Rate:  [59-71] 59 (03/14 0500) Resp:  [15-18] 16 (03/14 0500) BP: (120-141)/(57-107) 131/58 (03/14 0500) SpO2:  [95 %-100 %] 96 % (03/14 0500) Weight:  [64.1 kg] 64.1 kg (03/14 0124) Last BM Date : 02/20/24  Intake/Output from previous day: 03/13 0701 - 03/14 0700 In: 600.5 [I.V.:500.5; IV Piggyback:100] Out: 400 [Urine:400] Intake/Output this shift: No intake/output data recorded.  Alert, well-appearing Unlabored respirations Abdomen is soft, minimally subjectively tender in the lower abdomen, nondistended  Lab Results:  Recent Labs    02/20/24 2040 02/21/24 0505  WBC 8.5 7.3  HGB 8.9* 8.5*  HCT 27.9* 27.9*  PLT 277 259   BMET Recent Labs    02/20/24 2040 02/21/24 0505  NA 139 136  K 3.7 3.5  CL 111 109  CO2 21* 20*  GLUCOSE 90 83  BUN 33* 28*  CREATININE 1.36* 1.19  CALCIUM 8.2* 7.8*   PT/INR No results for input(s): "LABPROT", "INR" in the last 72 hours. ABG No results for input(s): "PHART", "HCO3" in the last 72 hours.  Invalid input(s): "PCO2", "PO2"  Studies/Results: DG Abd Portable 1 View Result Date: 02/20/2024 CLINICAL DATA:  NG tube placement. EXAM: PORTABLE ABDOMEN - 1 VIEW COMPARISON:  February 20, 2023 FINDINGS: A nasogastric tube is seen with its distal end kinked and looped within the expected region of the distal esophagus. The pelvis and the mid and lower portions of the abdomen are not included in the field of view and are subsequently limited in evaluation. The bowel gas pattern is normal. No radio-opaque calculi or other significant radiographic abnormality are seen. IMPRESSION:  Malpositioned nasogastric tube positioning, as described above. Readjustment is recommended. Electronically Signed   By: Aram Candela M.D.   On: 02/20/2024 22:55   CT ABDOMEN PELVIS W CONTRAST Result Date: 02/20/2024 CLINICAL DATA:  Acute generalized abdominal pain. EXAM: CT ABDOMEN AND PELVIS WITH CONTRAST TECHNIQUE: Multidetector CT imaging of the abdomen and pelvis was performed using the standard protocol following bolus administration of intravenous contrast. RADIATION DOSE REDUCTION: This exam was performed according to the departmental dose-optimization program which includes automated exposure control, adjustment of the mA and/or kV according to patient size and/or use of iterative reconstruction technique. CONTRAST:  80mL OMNIPAQUE IOHEXOL 300 MG/ML  SOLN COMPARISON:  March 02, 2023.  February 17, 2023. FINDINGS: Lower chest: No acute abnormality. Hepatobiliary: No focal liver abnormality is seen. No gallstones, gallbladder wall thickening, or biliary dilatation. Pancreas: Unremarkable. No pancreatic ductal dilatation or surrounding inflammatory changes. Spleen: Normal in size without focal abnormality. Adrenals/Urinary Tract: Adrenal glands appear normal. No hydronephrosis or renal obstruction is noted. Stable bilateral renal cysts are noted for which no further follow-up is required. 2.4 x 1.8 cm enhancing exophytic mass is seen in the medial portion of left kidney again which is slightly enlarged in consistent with history of renal cell carcinoma as noted on prior MRI. Urinary bladder is unremarkable. Stomach/Bowel: Stomach is unremarkable. Extensive postsurgical changes are seen involving the right colon and distal small bowel consistent with history of right colectomy. There is continued small bowel dilatation, but there is now noted moderate diffuse small bowel wall  thickening concerning for enteritis or other inflammation. No definite colonic dilatation is noted. Stable blind-ending pouch with  appendicoliths is again noted in right side of abdomen which most likely represents postsurgical change. Vascular/Lymphatic: Aortic atherosclerosis. No enlarged abdominal or pelvic lymph nodes. Stable soft tissue density seen around the aorta suggesting some degree of retroperitoneal fibrosis. Reproductive: Prostate is unremarkable. Other: Small fat containing left inguinal hernia.  No ascites. Musculoskeletal: Status post surgical posterior fusion of L4-5 with bilateral intrapedicular screw placement. No acute osseous abnormality is noted. IMPRESSION: Extensive postsurgical changes are seen involving the right colon and distal small bowel consistent with history of right colectomy. There remains mild diffuse small bowel dilatation which extends to the surgical anastomosis in the right side of the abdomen, concerning for partial obstruction. There is the interval development of moderate wall thickening involving small bowel loops concerning for enteritis or other inflammatory process. 2.4 x 1.8 cm enhancing exophytic mass is seen in the medial portion of left kidney which is slightly enlarged compared to prior exam and consistent with renal cell carcinoma as noted on prior MRI. Aortic Atherosclerosis (ICD10-I70.0). Electronically Signed   By: Lupita Raider M.D.   On: 02/20/2024 16:56    Anti-infectives: Anti-infectives (From admission, onward)    None       Assessment/Plan: Enteritis versus partial small bowel obstruction: Clinically improving this morning.  Plain films this morning reviewed personally with air throughout the colon, no significant small bowel dilation.  Final read pending.  GI panel pending.  White count normal, creatinine improving. Okay to hold off on further NG tube attempts.  From my standpoint, okay to try clear liquids today and continue supportive care.  Surgery team will continue to follow.  AKI, syncope Left renal lesion suspicious for RCC BPH HLD HTN Normocytic  anemia Celiac artery stenosis status post dilation Syncope   LOS: 1 day    Cory Bright 02/21/2024

## 2024-02-21 NOTE — Progress Notes (Signed)
  Echocardiogram 2D Echocardiogram has been performed.  Delcie Roch 02/21/2024, 10:38 AM

## 2024-02-22 DIAGNOSIS — K56600 Partial intestinal obstruction, unspecified as to cause: Secondary | ICD-10-CM

## 2024-02-22 LAB — CBC
HCT: 28.1 % — ABNORMAL LOW (ref 39.0–52.0)
Hemoglobin: 8.9 g/dL — ABNORMAL LOW (ref 13.0–17.0)
MCH: 29.5 pg (ref 26.0–34.0)
MCHC: 31.7 g/dL (ref 30.0–36.0)
MCV: 93 fL (ref 80.0–100.0)
Platelets: 241 10*3/uL (ref 150–400)
RBC: 3.02 MIL/uL — ABNORMAL LOW (ref 4.22–5.81)
RDW: 12.8 % (ref 11.5–15.5)
WBC: 7.6 10*3/uL (ref 4.0–10.5)
nRBC: 0 % (ref 0.0–0.2)

## 2024-02-22 LAB — BASIC METABOLIC PANEL
Anion gap: 5 (ref 5–15)
BUN: 17 mg/dL (ref 8–23)
CO2: 23 mmol/L (ref 22–32)
Calcium: 8.2 mg/dL — ABNORMAL LOW (ref 8.9–10.3)
Chloride: 110 mmol/L (ref 98–111)
Creatinine, Ser: 0.94 mg/dL (ref 0.61–1.24)
GFR, Estimated: >60 mL/min (ref >60)
Glucose, Bld: 95 mg/dL (ref 70–99)
Potassium: 3.9 mmol/L (ref 3.5–5.1)
Sodium: 138 mmol/L (ref 135–145)

## 2024-02-22 MED ORDER — GABAPENTIN 300 MG PO CAPS: 300.0000 mg | ORAL_CAPSULE | Freq: Two times a day (BID) | ORAL | Status: AC

## 2024-02-22 NOTE — Plan of Care (Signed)

## 2024-02-22 NOTE — Discharge Summary (Signed)
 Physician Discharge Summary   Patient: Cory Bright MRN: [id number] DOB: 09-29-1936  Admit date:     02/20/2024  Discharge date: 02/22/24  Discharge Physician: Jacquelin Hawking, MD   PCP: Alysia Penna, MD   Recommendations at discharge:  PCP visit for hospital follow-up  Discharge Diagnoses: Principal Problem:   Partial small bowel obstruction (HCC) Active Problems:   HTN (hypertension)   HLD (hyperlipidemia)   BPH (benign prostatic hyperplasia)   Renal lesion   Normocytic anemia   Celiac artery stenosis (HCC)   Enteritis   Syncope  Resolved Problems:   * No resolved hospital problems. *  Hospital Course: Cory Bright is a 88 y.o. male with a history of SBO, colitis, hypertension, hyperlipidemia, BPH, history of right colon mass s/p right colectomy, renal carcinoma, anemia, celiac artery stenosis.  Patient presented secondary to abdominal pain, nausea, vomiting with concern for possible partial small bowel obstruction and enteritis on CT imaging. General surgery consulted. NG tube placed but required removal. Bowel function returning and emesis has resolved.  Assessment and Plan:  Partial small bowel obstruction Concerning for etiology of symptoms on admission. General surgery consulted with recommendation for NG tube placement and small bowel protocol. NG tube malpositioned with attempts to replace unsuccessful as patient declined continued attempts. General surgery recommending to hold NG tube placement and have advanced diet. Patient is having bowel function and nausea/vomiting improved. General surgery advanced patient's diet to a soft diet. Patient tolerated diet prior to discharge with continued bowel function.   Small bowel thickening Concerning for possible infectious etiology. No antibiotics started. GI pathogen panel ordered and is negative. Patient to follow-up with his gastroenterologist.   AKI Baseline creatinine of about 0.8-0.9. Creatinine of 1.60 on  admission. Improving with IV fluids.   Syncope In setting of nausea, vomiting and diarrhea with subsequent dehydration. Syncope occurred after severe abdominal pain episode with profuse vomiting. No recurrent episodes while admitted.   BPH Patient is on tamsulosin as an outpatient which is held while patient is NPO.   Primary hypertension Patient is on lisinopril as an outpatient which was held while patient is NPO. Blood pressure not well controlled. Resume home regimen on discharge.   Chronic normocytic anemia Baseline hemoglobin is between 8-10 g/dL. Hemoglobin of 10.5 g/dL on admission with an acute drop, likely secondary to initial dehydration state. Hemoglobin stable prior to discharge.   Celiac artery stenosis Noted.    Left renal lesion Known finding. Concerning for renal cell carcinoma. Patient follows with urology as an outpatient.   Aortic atherosclerosis Hyperlipidemia Aortic atherosclerosis noted on CT imaging. Patient is on pravastatin as an outpatient which is held while patient is NPO.   Consultants:  General surgery   Procedures:  NG tube placement/removal  Disposition: Home Diet recommendation: Soft diet   DISCHARGE MEDICATION: Allergies as of 02/22/2024       Reactions   Tramadol Itching        Medication List     STOP taking these medications    fluticasone 0.05 % cream Commonly known as: CUTIVATE       TAKE these medications    Benefiber Healthy Shape Powd Take 2 Capfuls by mouth daily.   gabapentin 300 MG capsule Commonly known as: NEURONTIN Take 1 capsule (300 mg total) by mouth 2 (two) times daily. What changed: See the new instructions.   lisinopril 20 MG tablet Commonly known as: ZESTRIL Take 20 mg by mouth daily.   omeprazole 20 MG capsule Commonly  known as: PRILOSEC Take 20 mg by mouth daily.   pravastatin 40 MG tablet Commonly known as: PRAVACHOL Take 40 mg by mouth every evening.   tamsulosin 0.4 MG Caps  capsule Commonly known as: FLOMAX Take 0.4 mg by mouth daily.        Follow-up Information     Alysia Penna, MD. Schedule an appointment as soon as possible for a visit in 1 week(s).   Specialty: Internal Medicine Why: For hospital follow-up Contact information: [address] [place] [address] [phone]         [name], MD Follow up.   Specialty: General Surgery Why: As needed Contact information: [address] [place] [address] [phone]                Discharge Exam: BP (!) 151/66 (BP Location: Left Arm)   Pulse 64   Temp 98.7 F (37.1 C) (Oral)   Resp 18   Ht 5\' 8"  (1.727 m)   Wt 64.1 kg   SpO2 97%   BMI 21.48 kg/m   General exam: Appears calm and comfortable Respiratory system: Clear to auscultation. Respiratory effort normal. Cardiovascular system: S1 & S2 heard, RRR. No murmurs. Gastrointestinal system: Abdomen is distended, soft and mildly tender. Normal bowel sounds heard. Central nervous system: Alert and oriented. No focal neurological deficits. Musculoskeletal: No edema. No calf tenderness Psychiatry: Judgement and insight appear normal. Mood & affect appropriate.   Condition at discharge: stable  The results of significant diagnostics from this hospitalization (including imaging, microbiology, ancillary and laboratory) are listed below for reference.   Imaging Studies: DG Abd 1 View Result Date: 02/21/2024 CLINICAL DATA:  Small bowel obstruction. EXAM: ABDOMEN - 1 VIEW COMPARISON:  None Available. FINDINGS: The bowel gas pattern is normal. No radio-opaque calculi or other significant radiographic abnormality are seen. IMPRESSION: No abnormal bowel dilatation. Electronically Signed   By: Lupita Raider M.D.   On: 02/21/2024 11:54   ECHOCARDIOGRAM COMPLETE Result Date: 02/21/2024    ECHOCARDIOGRAM REPORT   Patient Name:   Cory Bright Date of Exam: 02/21/2024 Medical Rec #:  [id number]        Height:       68.0 in Accession #:    [id number]      Weight:       141.3 lb Date of Birth:  1936-01-19       BSA:          1.763 m Patient Age:    87 years        BP:           149/52 mmHg Patient Gender: M               HR:           67 bpm. Exam Location:  Inpatient Procedure: 2D Echo (Both Spectral and Color Flow Doppler were utilized during            procedure). Indications:    syncope  History:        Patient has prior history of Echocardiogram examinations, most                 recent 01/23/2017. Risk Factors:Hypertension and Dyslipidemia.  Sonographer:    Delcie Roch RDCS Referring Phys: [id number] ANASTASSIA DOUTOVA IMPRESSIONS  1. Left ventricular ejection fraction, by estimation, is 65 to 70%. The left ventricle has normal function. The left ventricle has no regional wall motion abnormalities. There is  mild concentric left ventricular hypertrophy. Left ventricular diastolic parameters are consistent with Grade II diastolic dysfunction (pseudonormalization).  2. Right ventricular systolic function is normal. The right ventricular size is normal. There is moderately elevated pulmonary artery systolic pressure. The estimated right ventricular systolic pressure is 50.8 mmHg.  3. Left atrial size was severely dilated.  4. Right atrial size was severely dilated.  5. The mitral valve is normal in structure. No evidence of mitral valve regurgitation. No evidence of mitral stenosis.  6. The aortic valve is normal in structure. Aortic valve regurgitation is not visualized. No aortic stenosis is present.  7. The inferior vena cava is dilated in size with >50% respiratory variability, suggesting right atrial pressure of 8 mmHg. FINDINGS  Left Ventricle: Left ventricular ejection fraction, by estimation, is 65 to 70%. The left ventricle has normal function. The left ventricle has no regional wall motion abnormalities. The left ventricular internal cavity size was normal in size. There is  mild concentric left  ventricular hypertrophy. Left ventricular diastolic parameters are consistent with Grade II diastolic dysfunction (pseudonormalization). Right Ventricle: The right ventricular size is normal. No increase in right ventricular wall thickness. Right ventricular systolic function is normal. There is moderately elevated pulmonary artery systolic pressure. The tricuspid regurgitant velocity is 3.27 m/s, and with an assumed right atrial pressure of 8 mmHg, the estimated right ventricular systolic pressure is 50.8 mmHg. Left Atrium: Left atrial size was severely dilated. Right Atrium: Right atrial size was severely dilated. Pericardium: Trivial pericardial effusion is present. The pericardial effusion is posterior to the left ventricle. Mitral Valve: The mitral valve is normal in structure. No evidence of mitral valve regurgitation. No evidence of mitral valve stenosis. Tricuspid Valve: The tricuspid valve is normal in structure. Tricuspid valve regurgitation is mild . No evidence of tricuspid stenosis. Aortic Valve: The aortic valve is normal in structure. Aortic valve regurgitation is not visualized. No aortic stenosis is present. Pulmonic Valve: The pulmonic valve was normal in structure. Pulmonic valve regurgitation is trivial. No evidence of pulmonic stenosis. Aorta: The aortic root is normal in size and structure. Venous: The inferior vena cava is dilated in size with greater than 50% respiratory variability, suggesting right atrial pressure of 8 mmHg. IAS/Shunts: No atrial level shunt detected by color flow Doppler.  LEFT VENTRICLE PLAX 2D LVIDd:         4.70 cm   Diastology LVIDs:         2.80 cm   LV e' medial:    9.57 cm/s LV PW:         1.10 cm   LV E/e' medial:  13.1 LV IVS:        1.10 cm   LV e' lateral:   12.10 cm/s LVOT diam:     2.20 cm   LV E/e' lateral: 10.3 LV SV:         120 LV SV Index:   68 LVOT Area:     3.80 cm  RIGHT VENTRICLE             IVC RV Basal diam:  2.70 cm     IVC diam: 2.40 cm RV S  prime:     18.80 cm/s TAPSE (M-mode): 2.4 cm LEFT ATRIUM             Index        RIGHT ATRIUM           Index LA diam:        4.00  cm 2.27 cm/m   RA Area:     22.00 cm LA Vol (A2C):   71.9 ml 40.78 ml/m  RA Volume:   59.00 ml  33.46 ml/m LA Vol (A4C):   80.5 ml 45.66 ml/m LA Biplane Vol: 80.3 ml 45.54 ml/m  AORTIC VALVE LVOT Vmax:   149.00 cm/s LVOT Vmean:  101.000 cm/s LVOT VTI:    0.315 m  AORTA Ao Root diam: 3.10 cm Ao Asc diam:  3.00 cm MITRAL VALVE                TRICUSPID VALVE MV Area (PHT): 4.49 cm     TR Peak grad:   42.8 mmHg MV Decel Time: 169 msec     TR Vmax:        327.00 cm/s MV E velocity: 125.00 cm/s MV A velocity: 93.40 cm/s   SHUNTS MV E/A ratio:  1.34         Systemic VTI:  0.32 m                             Systemic Diam: 2.20 cm Arvilla Meres MD Electronically signed by Arvilla Meres MD Signature Date/Time: 02/21/2024/10:44:01 AM    Final    DG Abd Portable 1 View Result Date: 02/20/2024 CLINICAL DATA:  NG tube placement. EXAM: PORTABLE ABDOMEN - 1 VIEW COMPARISON:  February 20, 2023 FINDINGS: A nasogastric tube is seen with its distal end kinked and looped within the expected region of the distal esophagus. The pelvis and the mid and lower portions of the abdomen are not included in the field of view and are subsequently limited in evaluation. The bowel gas pattern is normal. No radio-opaque calculi or other significant radiographic abnormality are seen. IMPRESSION: Malpositioned nasogastric tube positioning, as described above. Readjustment is recommended. Electronically Signed   By: Aram Candela M.D.   On: 02/20/2024 22:55   CT ABDOMEN PELVIS W CONTRAST Result Date: 02/20/2024 CLINICAL DATA:  Acute generalized abdominal pain. EXAM: CT ABDOMEN AND PELVIS WITH CONTRAST TECHNIQUE: Multidetector CT imaging of the abdomen and pelvis was performed using the standard protocol following bolus administration of intravenous contrast. RADIATION DOSE REDUCTION: This exam was  performed according to the departmental dose-optimization program which includes automated exposure control, adjustment of the mA and/or kV according to patient size and/or use of iterative reconstruction technique. CONTRAST:  80mL OMNIPAQUE IOHEXOL 300 MG/ML  SOLN COMPARISON:  March 02, 2023.  February 17, 2023. FINDINGS: Lower chest: No acute abnormality. Hepatobiliary: No focal liver abnormality is seen. No gallstones, gallbladder wall thickening, or biliary dilatation. Pancreas: Unremarkable. No pancreatic ductal dilatation or surrounding inflammatory changes. Spleen: Normal in size without focal abnormality. Adrenals/Urinary Tract: Adrenal glands appear normal. No hydronephrosis or renal obstruction is noted. Stable bilateral renal cysts are noted for which no further follow-up is required. 2.4 x 1.8 cm enhancing exophytic mass is seen in the medial portion of left kidney again which is slightly enlarged in consistent with history of renal cell carcinoma as noted on prior MRI. Urinary bladder is unremarkable. Stomach/Bowel: Stomach is unremarkable. Extensive postsurgical changes are seen involving the right colon and distal small bowel consistent with history of right colectomy. There is continued small bowel dilatation, but there is now noted moderate diffuse small bowel wall thickening concerning for enteritis or other inflammation. No definite colonic dilatation is noted. Stable blind-ending pouch with appendicoliths is again noted in right side of abdomen which most likely  represents postsurgical change. Vascular/Lymphatic: Aortic atherosclerosis. No enlarged abdominal or pelvic lymph nodes. Stable soft tissue density seen around the aorta suggesting some degree of retroperitoneal fibrosis. Reproductive: Prostate is unremarkable. Other: Small fat containing left inguinal hernia.  No ascites. Musculoskeletal: Status post surgical posterior fusion of L4-5 with bilateral intrapedicular screw placement. No acute  osseous abnormality is noted. IMPRESSION: Extensive postsurgical changes are seen involving the right colon and distal small bowel consistent with history of right colectomy. There remains mild diffuse small bowel dilatation which extends to the surgical anastomosis in the right side of the abdomen, concerning for partial obstruction. There is the interval development of moderate wall thickening involving small bowel loops concerning for enteritis or other inflammatory process. 2.4 x 1.8 cm enhancing exophytic mass is seen in the medial portion of left kidney which is slightly enlarged compared to prior exam and consistent with renal cell carcinoma as noted on prior MRI. Aortic Atherosclerosis (ICD10-I70.0). Electronically Signed   By: Lupita Raider M.D.   On: 02/20/2024 16:56    Microbiology: Results for orders placed or performed during the hospital encounter of 02/20/24  Gastrointestinal Panel by PCR , Stool     Status: None   Collection Time: 02/20/24  2:34 AM   Specimen: Stool  Result Value Ref Range Status   Campylobacter species NOT DETECTED NOT DETECTED Final   Plesimonas shigelloides NOT DETECTED NOT DETECTED Final   Salmonella species NOT DETECTED NOT DETECTED Final   Yersinia enterocolitica NOT DETECTED NOT DETECTED Final   Vibrio species NOT DETECTED NOT DETECTED Final   Vibrio cholerae NOT DETECTED NOT DETECTED Final   Enteroaggregative E coli (EAEC) NOT DETECTED NOT DETECTED Final   Enteropathogenic E coli (EPEC) NOT DETECTED NOT DETECTED Final   Enterotoxigenic E coli (ETEC) NOT DETECTED NOT DETECTED Final   Shiga like toxin producing E coli (STEC) NOT DETECTED NOT DETECTED Final   Shigella/Enteroinvasive E coli (EIEC) NOT DETECTED NOT DETECTED Final   Cryptosporidium NOT DETECTED NOT DETECTED Final   Cyclospora cayetanensis NOT DETECTED NOT DETECTED Final   Entamoeba histolytica NOT DETECTED NOT DETECTED Final   Giardia lamblia NOT DETECTED NOT DETECTED Final   Adenovirus  F40/41 NOT DETECTED NOT DETECTED Final   Astrovirus NOT DETECTED NOT DETECTED Final   Norovirus GI/GII NOT DETECTED NOT DETECTED Final   Rotavirus A NOT DETECTED NOT DETECTED Final   Sapovirus (I, II, IV, and V) NOT DETECTED NOT DETECTED Final    Comment: Performed at [facility], [address] [place], [place] [address]  Resp panel by RT-PCR (RSV, Flu A&B, Covid) Anterior Nasal Swab     Status: None   Collection Time: 02/20/24 12:50 PM   Specimen: Anterior Nasal Swab  Result Value Ref Range Status   SARS Coronavirus 2 by RT PCR NEGATIVE NEGATIVE Final    Comment: (NOTE) SARS-CoV-2 target nucleic acids are NOT DETECTED.  The SARS-CoV-2 RNA is generally detectable in upper respiratory specimens during the acute phase of infection. The lowest concentration of SARS-CoV-2 viral copies this assay can detect is 138 copies/mL. A negative result does not preclude SARS-Cov-2 infection and should not be used as the sole basis for treatment or other patient management decisions. A negative result may occur with  improper specimen collection/handling, submission of specimen other than nasopharyngeal swab, presence of viral mutation(s) within the areas targeted by this assay, and inadequate number of viral copies(<138 copies/mL). A negative result must be combined with clinical observations, patient history, and epidemiological  information. The expected result is Negative.  Fact Sheet for Patients:  BloggerCourse.com  Fact Sheet for Healthcare Providers:  SeriousBroker.it  This test is no t yet approved or cleared by the [place] FDA and  has been authorized for detection and/or diagnosis of SARS-CoV-2 by FDA under an Emergency Use Authorization (EUA). This EUA will remain  in effect (meaning this test can be used) for the duration of the COVID-19 declaration under Section 564(b)(1) of the Act, 21 U.S.C.section  360bbb-3(b)(1), unless the authorization is terminated  or revoked sooner.       Influenza A by PCR NEGATIVE NEGATIVE Final   Influenza B by PCR NEGATIVE NEGATIVE Final    Comment: (NOTE) The Xpert Xpress SARS-CoV-2/FLU/RSV plus assay is intended as an aid in the diagnosis of influenza from Nasopharyngeal swab specimens and should not be used as a sole basis for treatment. Nasal washings and aspirates are unacceptable for Xpert Xpress SARS-CoV-2/FLU/RSV testing.  Fact Sheet for Patients: BloggerCourse.com  Fact Sheet for Healthcare Providers: SeriousBroker.it  This test is not yet approved or cleared by the [place] FDA and has been authorized for detection and/or diagnosis of SARS-CoV-2 by FDA under an Emergency Use Authorization (EUA). This EUA will remain in effect (meaning this test can be used) for the duration of the COVID-19 declaration under Section 564(b)(1) of the Act, 21 U.S.C. section 360bbb-3(b)(1), unless the authorization is terminated or revoked.     Resp Syncytial Virus by PCR NEGATIVE NEGATIVE Final    Comment: (NOTE) Fact Sheet for Patients: BloggerCourse.com  Fact Sheet for Healthcare Providers: SeriousBroker.it  This test is not yet approved or cleared by the [place] FDA and has been authorized for detection and/or diagnosis of SARS-CoV-2 by FDA under an Emergency Use Authorization (EUA). This EUA will remain in effect (meaning this test can be used) for the duration of the COVID-19 declaration under Section 564(b)(1) of the Act, 21 U.S.C. section 360bbb-3(b)(1), unless the authorization is terminated or revoked.  Performed at [facility], [address], [place], [place] [address]     Labs: CBC: Recent Labs  Lab 02/20/24 1134 02/20/24 2040 02/21/24 0505 02/22/24 0611  WBC 9.4 8.5 7.3 7.6  HGB 10.5* 8.9* 8.5*  8.9*  HCT 34.3* 27.9* 27.9* 28.1*  MCV 96.9 93.9 96.2 93.0  PLT 340 277 259 241   Basic Metabolic Panel: Recent Labs  Lab 02/20/24 1134 02/20/24 2040 02/21/24 0505 02/22/24 0611  NA 138 139 136 138  K 3.7 3.7 3.5 3.9  CL 107 111 109 110  CO2 23 21* 20* 23  GLUCOSE 112* 90 83 95  BUN 34* 33* 28* 17  CREATININE 1.60* 1.36* 1.19 0.94  CALCIUM 8.8* 8.2* 7.8* 8.2*  MG  --  1.6* 2.0  --   PHOS  --  3.5 3.3  --    Liver Function Tests: Recent Labs  Lab 02/20/24 1134 02/21/24 0505  AST 23 17  ALT 11 10  ALKPHOS 62 50  BILITOT 0.5 0.5  PROT 7.2 5.8*  ALBUMIN 3.4* 2.7*    Discharge time spent: 35 minutes.  Signed: Jacquelin Hawking, MD [facility] 02/22/2024

## 2024-02-22 NOTE — TOC Transition Note (Signed)
 Transition of Care [facility]) - Discharge Note   Patient Details  Name: Cory Bright MRN: [id number] Date of Birth: 22-Jan-1936  [facility] CM/SW Contact:  Adrian Prows, RN Phone Number: 02/22/2024, 2:00 PM   Clinical Narrative:    D/C orders received; no TOC needs.   Final next level of care: Home/Self Care Barriers to Discharge: No Barriers Identified   Patient Goals and CMS Choice Patient states their goals for this hospitalization and ongoing recovery are:: Home CMS Medicare.gov Compare Post Acute Care list provided to:: Patient Choice offered to / list presented to : Patient [place] ownership interest in [facility].provided to:: Patient    Discharge Placement                       Discharge Plan and Services Additional resources added to the After Visit Summary for                                       Social Drivers of Health (SDOH) Interventions SDOH Screenings   Food Insecurity: No Food Insecurity (02/21/2024)  Housing: Low Risk  (02/21/2024)  Transportation Needs: No Transportation Needs (02/21/2024)  Utilities: Not At Risk (02/21/2024)  Social Connections: Moderately Integrated (02/21/2024)  Tobacco Use: Low Risk  (02/21/2024)     Readmission Risk Interventions    02/13/2023   10:11 AM  Readmission Risk Prevention Plan  Post Dischage Appt Complete  Medication Screening Complete  Transportation Screening Complete

## 2024-02-22 NOTE — Progress Notes (Signed)
 Subjective/Chief Complaint: Continues to feel well this morning and tolerated full liquids yesterday without any nausea, pain, bloating.  Continued bowel movements which are no longer diarrhea consistency   Objective: Vital signs in last 24 hours: Temp:  [98.2 F (36.8 C)-98.5 F (36.9 C)] 98.5 F (36.9 C) (03/14 2035) Pulse Rate:  [60-74] 71 (03/15 0500) Resp:  [16-20] 20 (03/15 0500) BP: (145-175)/(52-69) 161/67 (03/15 0500) SpO2:  [96 %-99 %] 96 % (03/15 0500) Last BM Date : 02/21/24  Intake/Output from previous day: 03/14 0701 - 03/15 0700 In: 480 [P.O.:480] Out: -  Intake/Output this shift: No intake/output data recorded.  Alert, well-appearing Unlabored respirations Abdomen is soft, minimally subjectively tender in the lower abdomen, nondistended  Lab Results:  Recent Labs    02/21/24 0505 02/22/24 0611  WBC 7.3 7.6  HGB 8.5* 8.9*  HCT 27.9* 28.1*  PLT 259 241   BMET Recent Labs    02/21/24 0505 02/22/24 0611  NA 136 138  K 3.5 3.9  CL 109 110  CO2 20* 23  GLUCOSE 83 95  BUN 28* 17  CREATININE 1.19 0.94  CALCIUM 7.8* 8.2*   PT/INR No results for input(s): "LABPROT", "INR" in the last 72 hours. ABG No results for input(s): "PHART", "HCO3" in the last 72 hours.  Invalid input(s): "PCO2", "PO2"  Studies/Results: DG Abd 1 View Result Date: 02/21/2024 CLINICAL DATA:  Small bowel obstruction. EXAM: ABDOMEN - 1 VIEW COMPARISON:  None Available. FINDINGS: The bowel gas pattern is normal. No radio-opaque calculi or other significant radiographic abnormality are seen. IMPRESSION: No abnormal bowel dilatation. Electronically Signed   By: Lupita Raider M.D.   On: 02/21/2024 11:54   ECHOCARDIOGRAM COMPLETE Result Date: 02/21/2024    ECHOCARDIOGRAM REPORT   Patient Name:   Cory Bright Date of Exam: 02/21/2024 Medical Rec #:  [id number]       Height:       68.0 in Accession #:    [id number]      Weight:       141.3 lb Date of Birth:  86/08/1936        BSA:          1.763 m Patient Age:    88 years        BP:           149/52 mmHg Patient Gender: M               HR:           67 bpm. Exam Location:  Inpatient Procedure: 2D Echo (Both Spectral and Color Flow Doppler were utilized during            procedure). Indications:    syncope  History:        Patient has prior history of Echocardiogram examinations, most                 recent 01/23/2017. Risk Factors:Hypertension and Dyslipidemia.  Sonographer:    Delcie Roch RDCS Referring Phys: [id number] ANASTASSIA DOUTOVA IMPRESSIONS  1. Left ventricular ejection fraction, by estimation, is 65 to 70%. The left ventricle has normal function. The left ventricle has no regional wall motion abnormalities. There is mild concentric left ventricular hypertrophy. Left ventricular diastolic parameters are consistent with Grade II diastolic dysfunction (pseudonormalization).  2. Right ventricular systolic function is normal. The right ventricular size is normal. There is moderately elevated pulmonary artery systolic pressure. The estimated right ventricular systolic pressure is 50.8 mmHg.  3.  Left atrial size was severely dilated.  4. Right atrial size was severely dilated.  5. The mitral valve is normal in structure. No evidence of mitral valve regurgitation. No evidence of mitral stenosis.  6. The aortic valve is normal in structure. Aortic valve regurgitation is not visualized. No aortic stenosis is present.  7. The inferior vena cava is dilated in size with >50% respiratory variability, suggesting right atrial pressure of 8 mmHg. FINDINGS  Left Ventricle: Left ventricular ejection fraction, by estimation, is 65 to 70%. The left ventricle has normal function. The left ventricle has no regional wall motion abnormalities. The left ventricular internal cavity size was normal in size. There is  mild concentric left ventricular hypertrophy. Left ventricular diastolic parameters are consistent with Grade II diastolic dysfunction  (pseudonormalization). Right Ventricle: The right ventricular size is normal. No increase in right ventricular wall thickness. Right ventricular systolic function is normal. There is moderately elevated pulmonary artery systolic pressure. The tricuspid regurgitant velocity is 3.27 m/s, and with an assumed right atrial pressure of 8 mmHg, the estimated right ventricular systolic pressure is 50.8 mmHg. Left Atrium: Left atrial size was severely dilated. Right Atrium: Right atrial size was severely dilated. Pericardium: Trivial pericardial effusion is present. The pericardial effusion is posterior to the left ventricle. Mitral Valve: The mitral valve is normal in structure. No evidence of mitral valve regurgitation. No evidence of mitral valve stenosis. Tricuspid Valve: The tricuspid valve is normal in structure. Tricuspid valve regurgitation is mild . No evidence of tricuspid stenosis. Aortic Valve: The aortic valve is normal in structure. Aortic valve regurgitation is not visualized. No aortic stenosis is present. Pulmonic Valve: The pulmonic valve was normal in structure. Pulmonic valve regurgitation is trivial. No evidence of pulmonic stenosis. Aorta: The aortic root is normal in size and structure. Venous: The inferior vena cava is dilated in size with greater than 50% respiratory variability, suggesting right atrial pressure of 8 mmHg. IAS/Shunts: No atrial level shunt detected by color flow Doppler.  LEFT VENTRICLE PLAX 2D LVIDd:         4.70 cm   Diastology LVIDs:         2.80 cm   LV e' medial:    9.57 cm/s LV PW:         1.10 cm   LV E/e' medial:  13.1 LV IVS:        1.10 cm   LV e' lateral:   12.10 cm/s LVOT diam:     2.20 cm   LV E/e' lateral: 10.3 LV SV:         120 LV SV Index:   68 LVOT Area:     3.80 cm  RIGHT VENTRICLE             IVC RV Basal diam:  2.70 cm     IVC diam: 2.40 cm RV S prime:     18.80 cm/s TAPSE (M-mode): 2.4 cm LEFT ATRIUM             Index        RIGHT ATRIUM           Index LA  diam:        4.00 cm 2.27 cm/m   RA Area:     22.00 cm LA Vol (A2C):   71.9 ml 40.78 ml/m  RA Volume:   59.00 ml  33.46 ml/m LA Vol (A4C):   80.5 ml 45.66 ml/m LA Biplane Vol: 80.3 ml 45.54 ml/m  AORTIC  VALVE LVOT Vmax:   149.00 cm/s LVOT Vmean:  101.000 cm/s LVOT VTI:    0.315 m  AORTA Ao Root diam: 3.10 cm Ao Asc diam:  3.00 cm MITRAL VALVE                TRICUSPID VALVE MV Area (PHT): 4.49 cm     TR Peak grad:   42.8 mmHg MV Decel Time: 169 msec     TR Vmax:        327.00 cm/s MV E velocity: 125.00 cm/s MV A velocity: 93.40 cm/s   SHUNTS MV E/A ratio:  1.34         Systemic VTI:  0.32 m                             Systemic Diam: 2.20 cm Arvilla Meres MD Electronically signed by Arvilla Meres MD Signature Date/Time: 02/21/2024/10:44:01 AM    Final    DG Abd Portable 1 View Result Date: 02/20/2024 CLINICAL DATA:  NG tube placement. EXAM: PORTABLE ABDOMEN - 1 VIEW COMPARISON:  February 20, 2023 FINDINGS: A nasogastric tube is seen with its distal end kinked and looped within the expected region of the distal esophagus. The pelvis and the mid and lower portions of the abdomen are not included in the field of view and are subsequently limited in evaluation. The bowel gas pattern is normal. No radio-opaque calculi or other significant radiographic abnormality are seen. IMPRESSION: Malpositioned nasogastric tube positioning, as described above. Readjustment is recommended. Electronically Signed   By: Aram Candela M.D.   On: 02/20/2024 22:55   CT ABDOMEN PELVIS W CONTRAST Result Date: 02/20/2024 CLINICAL DATA:  Acute generalized abdominal pain. EXAM: CT ABDOMEN AND PELVIS WITH CONTRAST TECHNIQUE: Multidetector CT imaging of the abdomen and pelvis was performed using the standard protocol following bolus administration of intravenous contrast. RADIATION DOSE REDUCTION: This exam was performed according to the departmental dose-optimization program which includes automated exposure control, adjustment  of the mA and/or kV according to patient size and/or use of iterative reconstruction technique. CONTRAST:  80mL OMNIPAQUE IOHEXOL 300 MG/ML  SOLN COMPARISON:  March 02, 2023.  February 17, 2023. FINDINGS: Lower chest: No acute abnormality. Hepatobiliary: No focal liver abnormality is seen. No gallstones, gallbladder wall thickening, or biliary dilatation. Pancreas: Unremarkable. No pancreatic ductal dilatation or surrounding inflammatory changes. Spleen: Normal in size without focal abnormality. Adrenals/Urinary Tract: Adrenal glands appear normal. No hydronephrosis or renal obstruction is noted. Stable bilateral renal cysts are noted for which no further follow-up is required. 2.4 x 1.8 cm enhancing exophytic mass is seen in the medial portion of left kidney again which is slightly enlarged in consistent with history of renal cell carcinoma as noted on prior MRI. Urinary bladder is unremarkable. Stomach/Bowel: Stomach is unremarkable. Extensive postsurgical changes are seen involving the right colon and distal small bowel consistent with history of right colectomy. There is continued small bowel dilatation, but there is now noted moderate diffuse small bowel wall thickening concerning for enteritis or other inflammation. No definite colonic dilatation is noted. Stable blind-ending pouch with appendicoliths is again noted in right side of abdomen which most likely represents postsurgical change. Vascular/Lymphatic: Aortic atherosclerosis. No enlarged abdominal or pelvic lymph nodes. Stable soft tissue density seen around the aorta suggesting some degree of retroperitoneal fibrosis. Reproductive: Prostate is unremarkable. Other: Small fat containing left inguinal hernia.  No ascites. Musculoskeletal: Status post surgical posterior fusion of L4-5 with  bilateral intrapedicular screw placement. No acute osseous abnormality is noted. IMPRESSION: Extensive postsurgical changes are seen involving the right colon and distal  small bowel consistent with history of right colectomy. There remains mild diffuse small bowel dilatation which extends to the surgical anastomosis in the right side of the abdomen, concerning for partial obstruction. There is the interval development of moderate wall thickening involving small bowel loops concerning for enteritis or other inflammatory process. 2.4 x 1.8 cm enhancing exophytic mass is seen in the medial portion of left kidney which is slightly enlarged compared to prior exam and consistent with renal cell carcinoma as noted on prior MRI. Aortic Atherosclerosis (ICD10-I70.0). Electronically Signed   By: Lupita Raider M.D.   On: 02/20/2024 16:56    Anti-infectives: Anti-infectives (From admission, onward)    None       Assessment/Plan: Enteritis versus partial small bowel obstruction: Continued clinical improvement this morning.  Okay to try soft diet and if tolerating can be discharged home this afternoon..  AKI, syncope Left renal lesion suspicious for RCC BPH HLD HTN Normocytic anemia Celiac artery stenosis status post dilation Syncope   LOS: 2 days    [name] 02/22/2024

## 2024-02-22 NOTE — Progress Notes (Signed)
 Patient ate lunch, tolerated well, no complaints of abd pain, N/V.

## 2024-02-22 NOTE — Discharge Instructions (Signed)
 Cory Bright,  You were in the hospital with concern for a partial small bowel obstruction. This was managed conservatively and thankfully you have improved significantly. Please follow-up with your outpatient physicians.

## 2024-02-22 NOTE — Progress Notes (Signed)
 Patient tolerated soft diet for breakfast, nom complaints of nausea, vomiting, & diarrhea.

## 2024-03-02 DIAGNOSIS — E785 Hyperlipidemia, unspecified: Secondary | ICD-10-CM | POA: Diagnosis not present

## 2024-03-02 DIAGNOSIS — C642 Malignant neoplasm of left kidney, except renal pelvis: Secondary | ICD-10-CM | POA: Diagnosis not present

## 2024-03-02 DIAGNOSIS — K56600 Partial intestinal obstruction, unspecified as to cause: Secondary | ICD-10-CM | POA: Diagnosis not present

## 2024-03-02 DIAGNOSIS — I771 Stricture of artery: Secondary | ICD-10-CM | POA: Diagnosis not present

## 2024-03-02 DIAGNOSIS — I10 Essential (primary) hypertension: Secondary | ICD-10-CM | POA: Diagnosis not present

## 2024-03-02 DIAGNOSIS — N401 Enlarged prostate with lower urinary tract symptoms: Secondary | ICD-10-CM | POA: Diagnosis not present

## 2024-03-02 DIAGNOSIS — N179 Acute kidney failure, unspecified: Secondary | ICD-10-CM | POA: Diagnosis not present

## 2024-03-02 DIAGNOSIS — R55 Syncope and collapse: Secondary | ICD-10-CM | POA: Diagnosis not present

## 2024-03-02 DIAGNOSIS — D649 Anemia, unspecified: Secondary | ICD-10-CM | POA: Diagnosis not present

## 2024-03-05 DIAGNOSIS — R198 Other specified symptoms and signs involving the digestive system and abdomen: Secondary | ICD-10-CM | POA: Diagnosis not present

## 2024-03-09 DIAGNOSIS — N401 Enlarged prostate with lower urinary tract symptoms: Secondary | ICD-10-CM | POA: Diagnosis not present

## 2024-03-09 DIAGNOSIS — R972 Elevated prostate specific antigen [PSA]: Secondary | ICD-10-CM | POA: Diagnosis not present

## 2024-03-09 DIAGNOSIS — D649 Anemia, unspecified: Secondary | ICD-10-CM | POA: Diagnosis not present

## 2024-03-09 DIAGNOSIS — E785 Hyperlipidemia, unspecified: Secondary | ICD-10-CM | POA: Diagnosis not present

## 2024-03-09 DIAGNOSIS — I10 Essential (primary) hypertension: Secondary | ICD-10-CM | POA: Diagnosis not present

## 2024-03-09 DIAGNOSIS — E538 Deficiency of other specified B group vitamins: Secondary | ICD-10-CM | POA: Diagnosis not present

## 2024-03-16 DIAGNOSIS — N179 Acute kidney failure, unspecified: Secondary | ICD-10-CM | POA: Diagnosis not present

## 2024-03-16 DIAGNOSIS — C642 Malignant neoplasm of left kidney, except renal pelvis: Secondary | ICD-10-CM | POA: Diagnosis not present

## 2024-03-16 DIAGNOSIS — Z23 Encounter for immunization: Secondary | ICD-10-CM | POA: Diagnosis not present

## 2024-03-16 DIAGNOSIS — N401 Enlarged prostate with lower urinary tract symptoms: Secondary | ICD-10-CM | POA: Diagnosis not present

## 2024-03-16 DIAGNOSIS — E538 Deficiency of other specified B group vitamins: Secondary | ICD-10-CM | POA: Diagnosis not present

## 2024-03-16 DIAGNOSIS — Z85038 Personal history of other malignant neoplasm of large intestine: Secondary | ICD-10-CM | POA: Diagnosis not present

## 2024-03-16 DIAGNOSIS — E785 Hyperlipidemia, unspecified: Secondary | ICD-10-CM | POA: Diagnosis not present

## 2024-03-16 DIAGNOSIS — R152 Fecal urgency: Secondary | ICD-10-CM | POA: Diagnosis not present

## 2024-03-16 DIAGNOSIS — K219 Gastro-esophageal reflux disease without esophagitis: Secondary | ICD-10-CM | POA: Diagnosis not present

## 2024-03-16 DIAGNOSIS — K529 Noninfective gastroenteritis and colitis, unspecified: Secondary | ICD-10-CM | POA: Diagnosis not present

## 2024-03-16 DIAGNOSIS — Z1339 Encounter for screening examination for other mental health and behavioral disorders: Secondary | ICD-10-CM | POA: Diagnosis not present

## 2024-03-16 DIAGNOSIS — K769 Liver disease, unspecified: Secondary | ICD-10-CM | POA: Diagnosis not present

## 2024-03-16 DIAGNOSIS — M5412 Radiculopathy, cervical region: Secondary | ICD-10-CM | POA: Diagnosis not present

## 2024-03-16 DIAGNOSIS — Z0000 Encounter for general adult medical examination without abnormal findings: Secondary | ICD-10-CM | POA: Diagnosis not present

## 2024-03-16 DIAGNOSIS — D649 Anemia, unspecified: Secondary | ICD-10-CM | POA: Diagnosis not present

## 2024-03-16 DIAGNOSIS — Z1331 Encounter for screening for depression: Secondary | ICD-10-CM | POA: Diagnosis not present

## 2024-03-16 DIAGNOSIS — I10 Essential (primary) hypertension: Secondary | ICD-10-CM | POA: Diagnosis not present

## 2024-03-17 ENCOUNTER — Other Ambulatory Visit: Payer: Self-pay | Admitting: Internal Medicine

## 2024-03-17 ENCOUNTER — Encounter: Payer: Self-pay | Admitting: Internal Medicine

## 2024-03-17 DIAGNOSIS — K529 Noninfective gastroenteritis and colitis, unspecified: Secondary | ICD-10-CM

## 2024-03-17 DIAGNOSIS — R152 Fecal urgency: Secondary | ICD-10-CM

## 2024-03-20 ENCOUNTER — Ambulatory Visit
Admission: RE | Admit: 2024-03-20 | Discharge: 2024-03-20 | Disposition: A | Discharge status: Home / Self Care | Source: Ambulatory Visit | Attending: Internal Medicine | Admitting: Internal Medicine

## 2024-03-20 DIAGNOSIS — K3189 Other diseases of stomach and duodenum: Secondary | ICD-10-CM | POA: Diagnosis not present

## 2024-03-20 DIAGNOSIS — R152 Fecal urgency: Secondary | ICD-10-CM

## 2024-03-20 DIAGNOSIS — N2889 Other specified disorders of kidney and ureter: Secondary | ICD-10-CM | POA: Diagnosis not present

## 2024-03-20 DIAGNOSIS — K529 Noninfective gastroenteritis and colitis, unspecified: Secondary | ICD-10-CM

## 2024-03-20 DIAGNOSIS — K5730 Diverticulosis of large intestine without perforation or abscess without bleeding: Secondary | ICD-10-CM | POA: Diagnosis not present

## 2024-03-20 MED ORDER — IOPAMIDOL (ISOVUE-M 300) INJECTION 61%
15.0000 mL | Freq: Once | INTRAMUSCULAR | Status: AC | PRN
Start: 2024-03-20 — End: 2024-03-20
  Administered 2024-03-20: 15 mL via INTRATHECAL

## 2024-03-25 DIAGNOSIS — H16143 Punctate keratitis, bilateral: Secondary | ICD-10-CM | POA: Diagnosis not present

## 2024-03-25 DIAGNOSIS — H02831 Dermatochalasis of right upper eyelid: Secondary | ICD-10-CM | POA: Diagnosis not present

## 2024-03-25 DIAGNOSIS — D3131 Benign neoplasm of right choroid: Secondary | ICD-10-CM | POA: Diagnosis not present

## 2024-03-25 DIAGNOSIS — H02834 Dermatochalasis of left upper eyelid: Secondary | ICD-10-CM | POA: Diagnosis not present

## 2024-03-25 DIAGNOSIS — Z961 Presence of intraocular lens: Secondary | ICD-10-CM | POA: Diagnosis not present

## 2024-03-31 DIAGNOSIS — H02403 Unspecified ptosis of bilateral eyelids: Secondary | ICD-10-CM | POA: Diagnosis not present

## 2024-03-31 DIAGNOSIS — Z85038 Personal history of other malignant neoplasm of large intestine: Secondary | ICD-10-CM | POA: Diagnosis not present

## 2024-03-31 DIAGNOSIS — D649 Anemia, unspecified: Secondary | ICD-10-CM | POA: Diagnosis not present

## 2024-03-31 DIAGNOSIS — H04129 Dry eye syndrome of unspecified lacrimal gland: Secondary | ICD-10-CM | POA: Diagnosis not present

## 2024-04-01 DIAGNOSIS — K219 Gastro-esophageal reflux disease without esophagitis: Secondary | ICD-10-CM | POA: Diagnosis not present

## 2024-04-01 DIAGNOSIS — D649 Anemia, unspecified: Secondary | ICD-10-CM | POA: Diagnosis not present

## 2024-04-01 DIAGNOSIS — C642 Malignant neoplasm of left kidney, except renal pelvis: Secondary | ICD-10-CM | POA: Diagnosis not present

## 2024-04-01 DIAGNOSIS — Z1389 Encounter for screening for other disorder: Secondary | ICD-10-CM | POA: Diagnosis not present

## 2024-04-16 DIAGNOSIS — Z85038 Personal history of other malignant neoplasm of large intestine: Secondary | ICD-10-CM | POA: Diagnosis not present

## 2024-04-16 DIAGNOSIS — H02403 Unspecified ptosis of bilateral eyelids: Secondary | ICD-10-CM | POA: Diagnosis not present

## 2024-04-16 DIAGNOSIS — H16143 Punctate keratitis, bilateral: Secondary | ICD-10-CM | POA: Diagnosis not present

## 2024-04-16 DIAGNOSIS — D649 Anemia, unspecified: Secondary | ICD-10-CM | POA: Diagnosis not present

## 2024-04-17 DIAGNOSIS — D649 Anemia, unspecified: Secondary | ICD-10-CM | POA: Diagnosis not present

## 2024-06-01 DIAGNOSIS — N281 Cyst of kidney, acquired: Secondary | ICD-10-CM | POA: Diagnosis not present

## 2024-06-01 DIAGNOSIS — N2889 Other specified disorders of kidney and ureter: Secondary | ICD-10-CM | POA: Diagnosis not present

## 2024-06-01 DIAGNOSIS — I517 Cardiomegaly: Secondary | ICD-10-CM | POA: Diagnosis not present

## 2024-06-01 DIAGNOSIS — K769 Liver disease, unspecified: Secondary | ICD-10-CM | POA: Diagnosis not present

## 2024-06-01 DIAGNOSIS — N289 Disorder of kidney and ureter, unspecified: Secondary | ICD-10-CM | POA: Diagnosis not present

## 2024-06-01 DIAGNOSIS — I700 Atherosclerosis of aorta: Secondary | ICD-10-CM | POA: Diagnosis not present

## 2024-06-01 DIAGNOSIS — K8689 Other specified diseases of pancreas: Secondary | ICD-10-CM | POA: Diagnosis not present

## 2024-06-01 DIAGNOSIS — C649 Malignant neoplasm of unspecified kidney, except renal pelvis: Secondary | ICD-10-CM | POA: Diagnosis not present

## 2024-06-02 DIAGNOSIS — H02106 Unspecified ectropion of left eye, unspecified eyelid: Secondary | ICD-10-CM | POA: Diagnosis not present

## 2024-06-02 DIAGNOSIS — H02109 Unspecified ectropion of unspecified eye, unspecified eyelid: Secondary | ICD-10-CM | POA: Diagnosis not present

## 2024-06-02 DIAGNOSIS — H02103 Unspecified ectropion of right eye, unspecified eyelid: Secondary | ICD-10-CM | POA: Diagnosis not present

## 2024-06-02 DIAGNOSIS — H02403 Unspecified ptosis of bilateral eyelids: Secondary | ICD-10-CM | POA: Diagnosis not present

## 2024-06-10 NOTE — Progress Notes (Signed)
 No chief complaint on file.   HPI:   Mr. Jost is a 87-YO gentleman with a PMH of HTN, BPH, HLD, and lumbar stenosis. He reports that he had gradually worsening urinary hesitancy, urinary frequency, urge incontinence, weak stream, and incomplete bladder emptying over the past few of months. This problem is constant, worse at night, and a severe bother. He is having suprapubic discomfort. He reported that he had to use crede maneuver to empty his bladder. PVR on 09/10/11 was >446 ml. His creatinine was 1.23 on 05/14/2019. He denies fever, chills, nausea, vomiting, abd pain, flank pain, perineal pain, and hematuria. He denies recent UTIs or history of nephrolithiasis. He reports a history of urinary retention >5 years ago. He was seen by a urologist who diagnosed BPH and a UTI and started Flomax . He is still taking QD Flomax . He also notes a history of lumbar stenosis. He has had 2 surgeries for this problem within the past 5 years. He is S/P TURP on 10/06/19  with benign pathology. He is urinating for better and is quite happy.  He tried tapering off the Flomax .  It did slow down some, so he continued that.  He has no blood in his urine.  He has no pain with urination.  His postvoid residual was 114 cc in 6/22.  He was found to have a small right renal mass on MR scan and is for follow up of this.   CT angiography abdomen and pelvis with and without contrast 02/17/2023:   No adrenal nodule or mass. Small cyst noted  both kidneys. No followup imaging is recommended. 1.8 x 1.9 cm  enhancing lesion identified medial interpolar left kidney (image  80/5) this cannot definitely be seen to arise from the cortex  although renal cell carcinoma is a concern. Transitional  cell/urothelial neoplasm also a consideration. No evidence for  hydroureter. The urinary bladder appears normal for the degree of  distention. Midline posterior bladder diverticulum evident.    MR abdomen with and without contrast 03/02/2023:    1. Contrast enhancing exophytic mass arising from the posterior lip  of the right kidney, measuring 1.7 x 1.6 cm, consistent with renal  cell carcinoma.  2. No evidence of renal vein invasion, abdominal metastatic disease  or lymphadenopathy.    MR abdomen with and without contrast 06/17/2023:   1. Unchanged, contrast enhancing exophytic mass arising from the posterior lip of the left kidney measuring 1.8 x 1.8 cm, consistent with a small renal cell carcinoma. No evidence of renal vein invasion or lymphadenopathy in the abdomen. 2. Unchanged arterially enhancing lesion of the left lobe of the liver, hepatic segment III, measuring 2.3 x 1.9 cm, again with minimal underlying diffusion restriction but no other appreciable associated signal abnormality. This remains most consistent with a benign focal nodular hyperplasia, liver metastatic disease not favored but not strictly excluded. 3. Unchanged atrophy of the pancreatic tail, with dilatation of the distal pancreatic duct within the tip of the pancreatic tail, measuring up to 0.6 cm in caliber. This may reflect sequelae of prior pancreatitis involving the pancreatic tail, or perhaps a main duct IPMN. As previously reported, occult pancreatic mass not strictly excluded. 4. Unchanged circumferential soft tissue thickening about the lower aorta and proximal common iliac arteries, again consistent with aortitis without specific evidence of acute inflammation at this time.   MR abdomen with and without contrast 06/01/2024:  1. Unchanged enhancing mass arising from the posterior lip of the left kidney measuring 1.9  x 1.8 cm, remains consistent with a small renal cell carcinoma. 2. No evidence of lymphadenopathy or metastatic disease in the abdomen. 3. Unchanged arterially hyperenhancing lesion of the left lobe of the liver, hepatic segment III, measuring 2.4 x 1.6 cm. This is most likely a benign focal nodular hyperplasia or hepatic  adenoma, generally of doubtful clinical significance given stability and patient age. Continued attention on follow-up. 4. Unchanged mild atrophy of the pancreatic tail. Previously seen pancreatic ductal dilatation is significantly diminished on today's examination, no greater than 0.3 cm. This may reflect sequelae of chronic pancreatitis. As above, this is generally of doubtful clinical significance given behavior and patient age. 5. Unchanged circumferential soft tissue thickening about the lower aorta and proximal common iliac arteries. This remains consistent with sequelae of infectious or inflammatory aortitis without acute inflammatory findings at this time. 6. Cardiomegaly.   Tumor marker pattern over time has been the following: No results found for: CA199, CEA, CHROMGRNA, AFPTM, CA125, CA2729, CA153, HCG, PSA   Medical History[1]  Allergies[2]  Current Medications[3]  Family History[4]  Social History[5]  Patient Active Problem List   Diagnosis Date Noted   . Anemia 04/16/2024  . 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 Diverticulosis 03/25/2023  . History of colon cancer 03/25/2023  . Liver lesion 03/25/2023  . Dilation of pancreatic duct (CMD) 03/25/2023  . Right renal mass 03/20/2023  . Normocytic anemia 02/18/2023    Overview Note:    Last Assessment & Plan:   Formatting of this note might be different from the original.  No clinical bleeding   . Renal lesion 02/17/2023    Overview Note:    Last Assessment & Plan:   Formatting of this note might be different from the original.  Incidental finding  - Needs renal MRI for follow up   . Binocular vision disorder with diplopia 11/07/2020  . Pseudophakia, both eyes 11/07/2020  . 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 Bilateral dry eyes 11/30/2019  . Chronic low back pain 10/05/2019  . Sialadenitis 05/11/2019  . Spinal stenosis 04/17/2017  . HTN (hypertension) 10/31/2015   . HLD (hyperlipidemia) 10/31/2015  . BPH (benign prostatic hyperplasia) 10/31/2015  . Glaucoma suspect 11/28/2012  . 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 Dermatochalasis 11/30/2011    Resolved Problems   Diagnosis Date Noted Date Resolved  . Hypotension 02/10/2023 05/09/2023  . Fever 02/10/2023 05/09/2023  . Colitis 02/10/2023 05/09/2023    Overview Note:    Last Assessment & Plan:   Formatting of this note might be different from the original.  -SIRS criteria in this patient on presentation included: Leukocytosis, fever - both have resolved  -Patient has no current evidence of acute organ failure  -Sepsis ruled out  -Patient with h/o SBO and abdominal surgery  -CT re-read showed nonspecific abdominal wall inflammation in the area of his pain  -Suspected source is GI infection, likely colitis vs. Small bowel entertis  -Blood cultures negative to date  -Patient admitted, will continue to closely follow  -Treated with IV Cefepime  and Flagyl , will transition to PO antibiotics for discharge as per pharmacy  -C diff negative  -Stool studies negative     ROS:A comprehensive review of systems was negative.  EXAM:  Vitals:   06/11/24 1512  BP: (!) 171/72  Pulse:   Temp:   SpO2:    GENERAL: Vitals reviewed and listed below, alert, oriented, appears well hydrated and in no acute distress HEENT: WNL Neck: Norm ROM LUNGS: Clear  to auscultation bilaterally, no wheezes, rales or rhonchi, good air movement CV: HRRR, no peripheral edema ABDOMEN: Soft, non-tender without masses or organomegaly FLANK: GEN RIGHT/LEFT/BIL: does not CVA tenderness noted to palpation EXTREMITIES: Warm and well perfused Neuro:  Intact Skin:  No significant lesions noted PSYCH: Pleasant and cooperative, no obvious depression or anxiety GU: Penis and testicles normal.  Results for orders placed or performed in visit on 04/17/24  Anemia Profile   Collection Time: 04/17/24  1:35 PM  Result Value Ref Range    Iron 62 50 - 212 ug/dL   Transferrin 744 796 - 362 mg/dL   Ferritin 19 (L) 24 - 336 ng/mL   Total Iron Binding Capacity (TIBC) 365 290 - 518 ug/dL   Transferrin Saturation 17 15 - 45 %  CBC with Differential   Collection Time: 04/17/24  1:35 PM  Result Value Ref Range   WBC 7.90 4.40 - 11.00 10*3/uL   RBC 3.39 (L) 4.50 - 5.90 10*6/uL   Hemoglobin 10.1 (L) 14.0 - 17.5 g/dL   Hematocrit 70.2 (L) 58.4 - 50.4 %   Mean Corpuscular Volume (MCV) 87.7 80.0 - 96.0 fL   Mean Corpuscular Hemoglobin (MCH) 29.7 27.5 - 33.2 pg   Mean Corpuscular Hemoglobin Conc (MCHC) 33.8 33.0 - 37.0 g/dL   Red Cell Distribution Width (RDW) 13.4 12.3 - 17.0 %   Platelet Count (PLT) 250 150 - 450 10*3/uL   Mean Platelet Volume (MPV) 8.5 6.8 - 10.2 fL   Neutrophils % 61 %   Lymphocytes % 26 %   Monocytes % 11 %   Eosinophils % 2 %   Basophils % 1 %   nRBC % 0 %   Neutrophils Absolute 4.80 1.80 - 7.80 10*3/uL   Lymphocytes # 2.00 1.00 - 4.80 10*3/uL   Monocytes # 0.80 0.00 - 0.80 10*3/uL   Eosinophils # 0.20 0.00 - 0.50 10*3/uL   Basophils # 0.00 0.00 - 0.20 10*3/uL   nRBC Absolute 0.00 <=0.00 10*3/uL    Assessment:  Problem List[6]  Plan:  Diagnoses and all orders for this visit:  Benign prostatic hyperplasia with urinary obstruction  Renal lesion    Risk Assessment:  Overall Bob's doing well.  It appears that this lesion is completely stable in his kidney.  He did have a scan at another location that showed that it was slightly larger but it was still below 3 cm.  Will set him up to see us  in a year and he will call if he has problems in the interim.             Return in about 1 year (around 06/11/2025).    Electronically signed by: Tanda LITTIE Nicholaus DOUGLAS, MD 06/11/2024 3:51 PM        [1] Past Medical History: Diagnosis Date  . 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 Closed left ankle fracture Sept 2014  . Colitis 02/10/2023   Last  Assessment & Plan:   Formatting of this note might be different from the original.  -SIRS criteria in this patient on presentation included: Leukocytosis, fever - both have resolved  -Patient has no current evidence of acute organ failure  -Sepsis ruled out  -Patient with h/o SBO and abdominal surgery  -CT re-read showed nonspecific abdominal wall inflammation in the area of his pain    . Colon cancer    (CMD) 1993  . Dermatochalasis of both eyelids 2009  .  Dilation of pancreatic duct (CMD) 03/25/2023  . Diverticulosis 03/25/2023  . Enlarged prostate with lower urinary tract symptoms (LUTS)   . Enlarged salivary gland   . Fever 02/10/2023  . Glaucoma suspect 11/28/2012  . High cholesterol   . History of colon cancer 03/25/2023  . Hypertension   . Hypotension 02/10/2023  . Liver lesion 03/25/2023  . Lumbar stenosis   . Nuclear sclerotic cataract of both eyes 11/30/2011  . Posterior subcapsular age-related cataract of both eyes 06/15/2014  . Posterior subcapsular cataract 06/15/2014  . Pseudophakia of both eyes 01/21/2015  . Ptosis of both eyelids 2009  . Right renal mass 03/20/2023  . Small bowel obstruction    (CMD) 07/22/2014   medically managed at [facility]  [2] No Known Allergies [3]  Current Outpatient Medications:  .  acetaminophen  (TYLENOL ) 500 mg tablet, Take 1,000 mg by mouth every 6 (six) hours., Disp: , Rfl:  .  artificial tears, hypromellose, (Systane GeL) 0.3 % gel, Administer  into affected eye(s)., Disp: , Rfl:  .  gabapentin  (NEURONTIN ) 300 mg capsule, Take 1 capsule by mouth every morning and then take1 capsule by mouth every evening , Disp: , Rfl:  .  losartan (COZAAR) 100 mg tablet, 1 tablet., Disp: , Rfl:  .  omeprazole (PriLOSEC) 20 mg DR capsule, Take 1 capsule (20 mg total) by mouth daily before breakfast., Disp: 90 capsule, Rfl: 3 .  oxymetazoline, PF, (Upneeq, PF,) 0.1 % dpet, One drop each eye for improved lid position, repeat as needed, Disp: 1 each, Rfl:  6 .  pravastatin  (PRAVACHOL ) 40 mg tablet, Take 40 mg by mouth every evening., Disp: , Rfl:  .  tamsulosin  (FLOMAX ) 0.4 mg cap, TAKE 1 CAPSULE BY MOUTH DAILY, Disp: 90 capsule, Rfl: 3 .  oxymetazoline, PF, (Upneeq, PF,) 0.1 % dpet, One drop affected eye as needed, Disp: 1 each, Rfl: 6 .  sildenafiL, pulm.hypertension, (REVATIO) 20 mg tablet, TAKE ONE TO FIVE TABLETS BY MOUTH DAILY AS NEEDED, Disp: 50 tablet, Rfl: 3 [4] Family History Problem Relation Name Age of Onset  . Hypertension Mother    . Stroke Mother    . Heart disease Mother    . Hypertension Father    . Stroke Father    . Heart disease Father    . Heart disease Sister    . Alzheimer's disease Brother    . Heart disease Brother    . Glaucoma Neg Hx    . Macular degeneration Neg Hx    . Cancer Neg Hx    . Diabetes Neg Hx    [5] Social History Socioeconomic History  . Marital status: Married  Tobacco Use  . Smoking status: Never  . Smokeless tobacco: Never  Vaping Use  . Vaping status: Never Used  Substance and Sexual Activity  . Alcohol  use: Yes    Alcohol /week: 13.0 standard drinks of alcohol     Comment: Occ. wine  . Drug use: Never  . 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   Social Drivers of Health   Food Insecurity: Low Risk  (06/11/2024)   Food vital sign   . Within the past 12 months, you worried that your food would run out before you got money to buy more: Never true   . Within the past 12 months, the food you bought just didn't last and you didn't have money to get more: Never true  Transportation Needs: No Transportation Needs (  06/11/2024)   Transportation   . In the past 12 months, has lack of reliable transportation kept you from medical appointments, meetings, work or from getting things needed for daily living? : No  Safety: Low Risk  (06/11/2024)   Safety   . How often does anyone, including family and friends, physically hurt  you?: Never   . How often does anyone, including family and friends, insult or talk down to you?: Never   . How often does anyone, including family and friends, threaten you with harm?: Never   . How often does anyone, including family and friends, scream or curse at you?: Never  Living Situation: Low Risk  (06/11/2024)   Living Situation   . What is your living situation today?: I have a steady place to live   . Think about the place you live. Do you have problems with any of the following? Choose all that apply:: None/None on this list  [6] Patient Active Problem List Diagnosis  . Dermatochalasis  . Glaucoma suspect  . Choroidal nevus  . HTN (hypertension)  . HLD (hyperlipidemia)  . BPH (benign prostatic hyperplasia)  . Sialadenitis  . Chronic low back pain  . Bilateral dry eyes  . Binocular vision disorder with diplopia  . Pseudophakia, both eyes  . Right abducens nerve palsy  . Right renal mass  . Anastomotic stricture of small intestine  . Diverticulosis  . History of colon cancer  . Liver lesion  . Dilation of pancreatic duct (CMD)  . Renal lesion  . Spinal stenosis  . Normocytic anemia  . Anemia

## 2024-06-11 DIAGNOSIS — N401 Enlarged prostate with lower urinary tract symptoms: Secondary | ICD-10-CM | POA: Diagnosis not present

## 2024-06-11 DIAGNOSIS — N138 Other obstructive and reflux uropathy: Secondary | ICD-10-CM | POA: Diagnosis not present

## 2024-06-11 DIAGNOSIS — N289 Disorder of kidney and ureter, unspecified: Secondary | ICD-10-CM | POA: Diagnosis not present

## 2024-06-29 DIAGNOSIS — H02403 Unspecified ptosis of bilateral eyelids: Secondary | ICD-10-CM | POA: Diagnosis not present

## 2024-07-22 DIAGNOSIS — H02403 Unspecified ptosis of bilateral eyelids: Secondary | ICD-10-CM | POA: Diagnosis not present

## 2024-08-03 DIAGNOSIS — Z09 Encounter for follow-up examination after completed treatment for conditions other than malignant neoplasm: Secondary | ICD-10-CM | POA: Diagnosis not present

## 2024-08-26 DIAGNOSIS — K8689 Other specified diseases of pancreas: Secondary | ICD-10-CM | POA: Diagnosis not present

## 2024-08-26 DIAGNOSIS — R197 Diarrhea, unspecified: Secondary | ICD-10-CM | POA: Diagnosis not present

## 2024-08-26 DIAGNOSIS — R159 Full incontinence of feces: Secondary | ICD-10-CM | POA: Diagnosis not present

## 2024-09-03 DIAGNOSIS — L570 Actinic keratosis: Secondary | ICD-10-CM | POA: Diagnosis not present

## 2024-09-03 DIAGNOSIS — L821 Other seborrheic keratosis: Secondary | ICD-10-CM | POA: Diagnosis not present

## 2024-09-03 DIAGNOSIS — L814 Other melanin hyperpigmentation: Secondary | ICD-10-CM | POA: Diagnosis not present

## 2024-09-09 DIAGNOSIS — R197 Diarrhea, unspecified: Secondary | ICD-10-CM | POA: Diagnosis not present

## 2024-09-09 DIAGNOSIS — R159 Full incontinence of feces: Secondary | ICD-10-CM | POA: Diagnosis not present

## 2024-09-14 DIAGNOSIS — N401 Enlarged prostate with lower urinary tract symptoms: Secondary | ICD-10-CM | POA: Diagnosis not present

## 2024-09-14 DIAGNOSIS — I10 Essential (primary) hypertension: Secondary | ICD-10-CM | POA: Diagnosis not present

## 2024-09-14 DIAGNOSIS — K219 Gastro-esophageal reflux disease without esophagitis: Secondary | ICD-10-CM | POA: Diagnosis not present

## 2024-09-14 DIAGNOSIS — I499 Cardiac arrhythmia, unspecified: Secondary | ICD-10-CM | POA: Diagnosis not present

## 2024-09-14 DIAGNOSIS — D649 Anemia, unspecified: Secondary | ICD-10-CM | POA: Diagnosis not present

## 2024-09-14 DIAGNOSIS — E785 Hyperlipidemia, unspecified: Secondary | ICD-10-CM | POA: Diagnosis not present

## 2024-09-14 DIAGNOSIS — R152 Fecal urgency: Secondary | ICD-10-CM | POA: Diagnosis not present

## 2024-09-14 DIAGNOSIS — H02409 Unspecified ptosis of unspecified eyelid: Secondary | ICD-10-CM | POA: Diagnosis not present

## 2024-09-14 DIAGNOSIS — C642 Malignant neoplasm of left kidney, except renal pelvis: Secondary | ICD-10-CM | POA: Diagnosis not present

## 2024-09-14 DIAGNOSIS — R5383 Other fatigue: Secondary | ICD-10-CM | POA: Diagnosis not present

## 2024-09-14 DIAGNOSIS — E538 Deficiency of other specified B group vitamins: Secondary | ICD-10-CM | POA: Diagnosis not present

## 2024-09-15 DIAGNOSIS — Z09 Encounter for follow-up examination after completed treatment for conditions other than malignant neoplasm: Secondary | ICD-10-CM | POA: Diagnosis not present

## 2024-10-08 DIAGNOSIS — D649 Anemia, unspecified: Secondary | ICD-10-CM | POA: Diagnosis not present

## 2024-10-14 DIAGNOSIS — K769 Liver disease, unspecified: Secondary | ICD-10-CM | POA: Diagnosis not present

## 2024-10-14 DIAGNOSIS — K219 Gastro-esophageal reflux disease without esophagitis: Secondary | ICD-10-CM | POA: Diagnosis not present

## 2024-10-14 DIAGNOSIS — Z8719 Personal history of other diseases of the digestive system: Secondary | ICD-10-CM | POA: Diagnosis not present

## 2024-10-14 DIAGNOSIS — K913 Postprocedural intestinal obstruction, unspecified as to partial versus complete: Secondary | ICD-10-CM | POA: Diagnosis not present

## 2024-10-14 DIAGNOSIS — K9189 Other postprocedural complications and disorders of digestive system: Secondary | ICD-10-CM | POA: Diagnosis not present

## 2024-10-14 DIAGNOSIS — R197 Diarrhea, unspecified: Secondary | ICD-10-CM | POA: Diagnosis not present

## 2024-10-14 DIAGNOSIS — D509 Iron deficiency anemia, unspecified: Secondary | ICD-10-CM | POA: Diagnosis not present

## 2024-10-14 DIAGNOSIS — Z85038 Personal history of other malignant neoplasm of large intestine: Secondary | ICD-10-CM | POA: Diagnosis not present

## 2024-10-14 DIAGNOSIS — K8689 Other specified diseases of pancreas: Secondary | ICD-10-CM | POA: Diagnosis not present

## 2024-10-14 DIAGNOSIS — K5790 Diverticulosis of intestine, part unspecified, without perforation or abscess without bleeding: Secondary | ICD-10-CM | POA: Diagnosis not present

## 2024-10-19 DIAGNOSIS — H02403 Unspecified ptosis of bilateral eyelids: Secondary | ICD-10-CM | POA: Diagnosis not present

## 2024-10-19 DIAGNOSIS — H04123 Dry eye syndrome of bilateral lacrimal glands: Secondary | ICD-10-CM | POA: Diagnosis not present

## 2024-10-19 DIAGNOSIS — H524 Presbyopia: Secondary | ICD-10-CM | POA: Diagnosis not present

## 2024-10-19 DIAGNOSIS — H5203 Hypermetropia, bilateral: Secondary | ICD-10-CM | POA: Diagnosis not present

## 2024-10-19 DIAGNOSIS — D509 Iron deficiency anemia, unspecified: Secondary | ICD-10-CM | POA: Diagnosis not present

## 2024-10-19 DIAGNOSIS — H16143 Punctate keratitis, bilateral: Secondary | ICD-10-CM | POA: Diagnosis not present

## 2024-10-19 DIAGNOSIS — H52223 Regular astigmatism, bilateral: Secondary | ICD-10-CM | POA: Diagnosis not present

## 2024-10-20 DIAGNOSIS — D509 Iron deficiency anemia, unspecified: Secondary | ICD-10-CM | POA: Diagnosis not present

## 2024-10-23 DIAGNOSIS — D649 Anemia, unspecified: Secondary | ICD-10-CM | POA: Diagnosis not present
